# Patient Record
Sex: Male | Born: 1945 | Race: White | Hispanic: No | Smoking: Former smoker
Health system: Southern US, Community
[De-identification: ages and names within clinical notes are randomized; demographics above are authoritative.]

## PROBLEM LIST (undated history)

## (undated) DIAGNOSIS — N183 Chronic kidney disease, stage 3 unspecified: Secondary | ICD-10-CM

## (undated) DIAGNOSIS — Q632 Ectopic kidney: Secondary | ICD-10-CM

## (undated) DIAGNOSIS — T7840XA Allergy, unspecified, initial encounter: Secondary | ICD-10-CM

## (undated) DIAGNOSIS — E119 Type 2 diabetes mellitus without complications: Secondary | ICD-10-CM

## (undated) DIAGNOSIS — F32A Depression, unspecified: Secondary | ICD-10-CM

## (undated) DIAGNOSIS — M1990 Unspecified osteoarthritis, unspecified site: Secondary | ICD-10-CM

## (undated) DIAGNOSIS — N189 Chronic kidney disease, unspecified: Secondary | ICD-10-CM

## (undated) DIAGNOSIS — N529 Male erectile dysfunction, unspecified: Secondary | ICD-10-CM

## (undated) DIAGNOSIS — I10 Essential (primary) hypertension: Secondary | ICD-10-CM

## (undated) DIAGNOSIS — R569 Unspecified convulsions: Secondary | ICD-10-CM

## (undated) DIAGNOSIS — E785 Hyperlipidemia, unspecified: Secondary | ICD-10-CM

## (undated) DIAGNOSIS — F329 Major depressive disorder, single episode, unspecified: Secondary | ICD-10-CM

## (undated) HISTORY — DX: Male erectile dysfunction, unspecified: N52.9

## (undated) HISTORY — DX: Hyperlipidemia, unspecified: E78.5

## (undated) HISTORY — DX: Essential (primary) hypertension: I10

## (undated) HISTORY — DX: Depression, unspecified: F32.A

## (undated) HISTORY — DX: Allergy, unspecified, initial encounter: T78.40XA

## (undated) HISTORY — PX: HERNIA REPAIR: SHX51

## (undated) HISTORY — DX: Major depressive disorder, single episode, unspecified: F32.9

---

## 2006-06-29 ENCOUNTER — Ambulatory Visit: Payer: Self-pay | Admitting: Gastroenterology

## 2009-12-05 ENCOUNTER — Emergency Department: Payer: Self-pay | Admitting: Unknown Physician Specialty

## 2010-03-02 ENCOUNTER — Ambulatory Visit: Payer: Self-pay | Admitting: Gastroenterology

## 2012-01-24 ENCOUNTER — Ambulatory Visit: Payer: Self-pay | Admitting: Family Medicine

## 2012-02-16 ENCOUNTER — Ambulatory Visit: Payer: Self-pay | Admitting: Family Medicine

## 2013-12-02 ENCOUNTER — Ambulatory Visit: Payer: Self-pay | Admitting: Family Medicine

## 2014-11-27 HISTORY — PX: COLONOSCOPY: SHX174

## 2015-03-11 ENCOUNTER — Ambulatory Visit
Admit: 2015-03-11 | Disposition: A | Discharge status: Home / Self Care | Payer: Self-pay | Attending: Gastroenterology | Admitting: Gastroenterology

## 2015-03-22 LAB — SURGICAL PATHOLOGY

## 2015-07-20 ENCOUNTER — Encounter: Payer: Self-pay | Admitting: Family Medicine

## 2015-07-20 ENCOUNTER — Ambulatory Visit (INDEPENDENT_AMBULATORY_CARE_PROVIDER_SITE_OTHER): Payer: PPO | Admitting: Family Medicine

## 2015-07-20 VITALS — BP 130/80 | HR 70 | Ht 72.0 in | Wt 251.0 lb

## 2015-07-20 DIAGNOSIS — F32A Depression, unspecified: Secondary | ICD-10-CM | POA: Insufficient documentation

## 2015-07-20 DIAGNOSIS — E785 Hyperlipidemia, unspecified: Secondary | ICD-10-CM

## 2015-07-20 DIAGNOSIS — M19042 Primary osteoarthritis, left hand: Secondary | ICD-10-CM | POA: Diagnosis not present

## 2015-07-20 DIAGNOSIS — J301 Allergic rhinitis due to pollen: Secondary | ICD-10-CM

## 2015-07-20 DIAGNOSIS — I10 Essential (primary) hypertension: Secondary | ICD-10-CM | POA: Diagnosis not present

## 2015-07-20 DIAGNOSIS — M19041 Primary osteoarthritis, right hand: Secondary | ICD-10-CM

## 2015-07-20 DIAGNOSIS — F329 Major depressive disorder, single episode, unspecified: Secondary | ICD-10-CM | POA: Diagnosis not present

## 2015-07-20 DIAGNOSIS — N529 Male erectile dysfunction, unspecified: Secondary | ICD-10-CM

## 2015-07-20 DIAGNOSIS — M19049 Primary osteoarthritis, unspecified hand: Secondary | ICD-10-CM | POA: Insufficient documentation

## 2015-07-20 MED ORDER — SILDENAFIL CITRATE 100 MG PO TABS: 100.0000 mg | ORAL_TABLET | Freq: Every day | ORAL | Status: DC | PRN

## 2015-07-20 MED ORDER — FENOFIBRATE 145 MG PO TABS: 145.0000 mg | ORAL_TABLET | Freq: Every day | ORAL | Status: DC

## 2015-07-20 MED ORDER — SERTRALINE HCL 50 MG PO TABS: 50.0000 mg | ORAL_TABLET | Freq: Every day | ORAL | Status: DC

## 2015-07-20 MED ORDER — DICLOFENAC SODIUM 75 MG PO TBEC: 75.0000 mg | DELAYED_RELEASE_TABLET | Freq: Every day | ORAL | Status: DC

## 2015-07-20 MED ORDER — OMEGA-3-ACID ETHYL ESTERS 1 G PO CAPS: 1.0000 | ORAL_CAPSULE | Freq: Every day | ORAL | Status: DC

## 2015-07-20 MED ORDER — CARVEDILOL 6.25 MG PO TABS: 6.2500 mg | ORAL_TABLET | Freq: Two times a day (BID) | ORAL | Status: DC

## 2015-07-20 MED ORDER — FLUTICASONE PROPIONATE 50 MCG/ACT NA SUSP: 1.0000 | Freq: Every day | NASAL | Status: DC

## 2015-07-20 MED ORDER — HYDROCHLOROTHIAZIDE 12.5 MG PO TABS: 12.5000 mg | ORAL_TABLET | Freq: Every day | ORAL | Status: DC

## 2015-07-20 NOTE — Progress Notes (Signed)
Name: Gary Kramer   MRN: [id number]    DOB: 02-14-1946   Date:07/20/2015       Progress Note  Subjective  Chief Complaint  Chief Complaint  Patient presents with  . Allergic Rhinitis   . Hypertension  . Hyperlipidemia  . Depression    Hypertension This is a chronic problem. The current episode started more than 1 year ago. The problem has been gradually improving since onset. Pertinent negatives include no anxiety, blurred vision, chest pain, headaches, malaise/fatigue, neck pain, orthopnea, palpitations, peripheral edema, PND, shortness of breath or sweats. There are no associated agents to hypertension. Risk factors for coronary artery disease include dyslipidemia. Past treatments include beta blockers and diuretics. The current treatment provides moderate improvement. There are no compliance problems.  There is no history of angina, kidney disease, CAD/MI, CVA, heart failure, left ventricular hypertrophy, PVD, renovascular disease or retinopathy. There is no history of chronic renal disease.  Hyperlipidemia This is a chronic problem. The current episode started more than 1 year ago. The problem is controlled. Recent lipid tests were reviewed and are normal. He has no history of chronic renal disease, diabetes, hypothyroidism, liver disease, obesity or nephrotic syndrome. Factors aggravating his hyperlipidemia include beta blockers. Pertinent negatives include no chest pain, focal sensory loss, focal weakness, leg pain, myalgias or shortness of breath. Current antihyperlipidemic treatment includes nicotinic acid. There are no compliance problems.  There are no known risk factors for coronary artery disease.  Depression        The patient presents with no depression.  This is a recurrent problem.  The current episode started more than 1 year ago.   The onset quality is gradual.   The problem occurs intermittently.  The problem has been gradually improving since onset.  Associated symptoms  include does not have insomnia, no myalgias, no headaches and no suicidal ideas.     The symptoms are aggravated by nothing.  Past treatments include SSRIs - Selective serotonin reuptake inhibitors.  Compliance with treatment is good.   Pertinent negatives include no chronic fatigue syndrome, no chronic pain, no hypothyroidism, no chronic illness, no physical disability, no terminal illness, no anxiety, no eating disorder and no depression. Erectile Dysfunction This is a chronic problem. The current episode started more than 1 year ago. The problem has been waxing and waning since onset. He reports no anxiety or decreased libido. Irritative symptoms do not include frequency, nocturia or urgency. Pertinent negatives include no chills, dysuria or hematuria. Past treatments include sildenafil. The treatment provided mild relief.  Allergic Reaction This is a recurrent problem. The problem occurs intermittently. The problem has been waxing and waning since onset. Associated symptoms include eye itching and eye watering. Pertinent negatives include no abdominal pain, chest pain, coughing, diarrhea, rash or wheezing. There is no swelling present. Past treatments include one or more prescription drugs. The treatment provided moderate relief. His past medical history is significant for seasonal allergies.    No problem-specific assessment & plan notes found for this encounter.   Past Medical History  Diagnosis Date  . Hypertension   . Hyperlipidemia   . Depression   . Allergy   . Erectile dysfunction     Past Surgical History  Procedure Laterality Date  . Colonoscopy  2016    cleared for 5 yrs- Dr Candace Cruise    History reviewed. No pertinent family history.  Social History   Social History  . Marital Status: Married    Spouse Name: N/A  .  Number of Children: N/A  . Years of Education: N/A   Occupational History  . Not on file.   Social History Main Topics  . Smoking status: Former Research scientist (life sciences)  .  Smokeless tobacco: Not on file  . Alcohol Use: 0.0 oz/week    0 Standard drinks or equivalent per week  . Drug Use: No  . Sexual Activity: Yes   Other Topics Concern  . Not on file   Social History Narrative  . No narrative on file    Allergies  Allergen Reactions  . Cefaclor Itching  . Penicillin V Potassium Rash     Review of Systems  Constitutional: Negative for fever, chills, weight loss and malaise/fatigue.  HENT: Negative for ear discharge, ear pain and sore throat.   Eyes: Positive for itching. Negative for blurred vision.  Respiratory: Negative for cough, sputum production, shortness of breath and wheezing.   Cardiovascular: Negative for chest pain, palpitations, orthopnea, leg swelling and PND.  Gastrointestinal: Negative for heartburn, nausea, abdominal pain, diarrhea, constipation, blood in stool and melena.  Genitourinary: Negative for dysuria, urgency, frequency, hematuria, decreased libido and nocturia.  Musculoskeletal: Negative for myalgias, back pain, joint pain and neck pain.  Skin: Negative for rash.  Neurological: Negative for dizziness, tingling, sensory change, focal weakness and headaches.  Endo/Heme/Allergies: Negative for environmental allergies and polydipsia. Does not bruise/bleed easily.  Psychiatric/Behavioral: Positive for depression. Negative for suicidal ideas. The patient is not nervous/anxious and does not have insomnia.      Objective  Filed Vitals:   07/20/15 1419  BP: 130/80  Pulse: 70  Height: 6' (1.829 m)  Weight: 251 lb (113.853 kg)    Physical Exam  Constitutional: He is oriented to person, place, and time and well-developed, well-nourished, and in no distress.  HENT:  Head: Normocephalic.  Right Ear: External ear normal.  Left Ear: External ear normal.  Nose: Nose normal.  Mouth/Throat: Oropharynx is clear and moist.  Eyes: Conjunctivae and EOM are normal. Pupils are equal, round, and reactive to light. Right eye exhibits  no discharge. Left eye exhibits no discharge. No scleral icterus.  Neck: Normal range of motion. Neck supple. No JVD present. No tracheal deviation present. No thyromegaly present.  Cardiovascular: Normal rate, regular rhythm, normal heart sounds and intact distal pulses.  Exam reveals no gallop and no friction rub.   No murmur heard. Pulmonary/Chest: Breath sounds normal. No respiratory distress. He has no wheezes. He has no rales.  Abdominal: Soft. Bowel sounds are normal. He exhibits no mass. There is no hepatosplenomegaly. There is no tenderness. There is no rebound, no guarding and no CVA tenderness.  Musculoskeletal: Normal range of motion. He exhibits no edema or tenderness.  Lymphadenopathy:    He has no cervical adenopathy.  Neurological: He is alert and oriented to person, place, and time. He has normal sensation, normal strength, normal reflexes and intact cranial nerves. No cranial nerve deficit.  Skin: Skin is warm. No rash noted.  Psychiatric: Mood and affect normal.      Assessment & Plan  Problem List Items Addressed This Visit      Cardiovascular and Mediastinum   Essential hypertension - Primary   Relevant Medications   aspirin EC 81 MG tablet   carvedilol (COREG) 6.25 MG tablet   fenofibrate (TRICOR) 145 MG tablet   hydrochlorothiazide (HYDRODIURIL) 12.5 MG tablet   omega-3 acid ethyl esters (LOVAZA) 1 G capsule   sildenafil (VIAGRA) 100 MG tablet   Other Relevant Orders  Renal Function Panel     Respiratory   Allergic rhinitis due to pollen   Relevant Medications   fluticasone (FLONASE) 50 MCG/ACT nasal spray     Musculoskeletal and Integument   Degenerative joint disease of hand   Relevant Medications   aspirin EC 81 MG tablet   diclofenac (VOLTAREN) 75 MG EC tablet     Genitourinary   Erectile dysfunction   Relevant Medications   sildenafil (VIAGRA) 100 MG tablet     Other   Hyperlipidemia   Relevant Medications   aspirin EC 81 MG tablet    carvedilol (COREG) 6.25 MG tablet   fenofibrate (TRICOR) 145 MG tablet   hydrochlorothiazide (HYDRODIURIL) 12.5 MG tablet   omega-3 acid ethyl esters (LOVAZA) 1 G capsule   sildenafil (VIAGRA) 100 MG tablet   Other Relevant Orders   Lipid Profile   Depression   Relevant Medications   sertraline (ZOLOFT) 50 MG tablet        Dr. Rylei Codispoti [facility]  07/20/2015

## 2015-07-21 ENCOUNTER — Other Ambulatory Visit: Payer: Self-pay

## 2015-07-21 LAB — LIPID PANEL
CHOLESTEROL TOTAL: 161 mg/dL (ref 100–199)
Chol/HDL Ratio: 5.8 ratio units — ABNORMAL HIGH (ref 0.0–5.0)
HDL: 28 mg/dL — ABNORMAL LOW (ref >39)
LDL Calculated: 84 mg/dL (ref 0–99)
Triglycerides: 243 mg/dL — ABNORMAL HIGH (ref 0–149)
VLDL Cholesterol Cal: 49 mg/dL — ABNORMAL HIGH (ref 5–40)

## 2015-07-21 LAB — RENAL FUNCTION PANEL: PHOSPHORUS: 3.1 mg/dL (ref 2.5–4.5)

## 2015-08-30 ENCOUNTER — Other Ambulatory Visit: Payer: Self-pay | Admitting: Family Medicine

## 2015-08-30 DIAGNOSIS — E785 Hyperlipidemia, unspecified: Secondary | ICD-10-CM

## 2015-09-13 ENCOUNTER — Ambulatory Visit (INDEPENDENT_AMBULATORY_CARE_PROVIDER_SITE_OTHER): Payer: PPO

## 2015-09-13 DIAGNOSIS — Z23 Encounter for immunization: Secondary | ICD-10-CM

## 2016-01-14 ENCOUNTER — Ambulatory Visit (INDEPENDENT_AMBULATORY_CARE_PROVIDER_SITE_OTHER): Payer: PPO | Admitting: Family Medicine

## 2016-01-14 ENCOUNTER — Encounter: Payer: Self-pay | Admitting: Family Medicine

## 2016-01-14 VITALS — BP 102/62 | HR 78 | Ht 72.0 in | Wt 245.0 lb

## 2016-01-14 DIAGNOSIS — M19042 Primary osteoarthritis, left hand: Secondary | ICD-10-CM | POA: Diagnosis not present

## 2016-01-14 DIAGNOSIS — Z0000 Encounter for general adult medical examination without abnormal findings: Secondary | ICD-10-CM | POA: Diagnosis not present

## 2016-01-14 DIAGNOSIS — E785 Hyperlipidemia, unspecified: Secondary | ICD-10-CM | POA: Diagnosis not present

## 2016-01-14 DIAGNOSIS — I10 Essential (primary) hypertension: Secondary | ICD-10-CM | POA: Diagnosis not present

## 2016-01-14 DIAGNOSIS — F329 Major depressive disorder, single episode, unspecified: Secondary | ICD-10-CM | POA: Diagnosis not present

## 2016-01-14 DIAGNOSIS — M19041 Primary osteoarthritis, right hand: Secondary | ICD-10-CM

## 2016-01-14 DIAGNOSIS — F32A Depression, unspecified: Secondary | ICD-10-CM

## 2016-01-14 MED ORDER — OMEGA-3-ACID ETHYL ESTERS 1 G PO CAPS: 1.0000 | ORAL_CAPSULE | Freq: Every day | ORAL | Status: DC

## 2016-01-14 MED ORDER — HYDROCHLOROTHIAZIDE 12.5 MG PO TABS: 12.5000 mg | ORAL_TABLET | Freq: Every day | ORAL | Status: DC

## 2016-01-14 MED ORDER — ATORVASTATIN CALCIUM 20 MG PO TABS: ORAL_TABLET | ORAL | Status: DC

## 2016-01-14 MED ORDER — FENOFIBRATE 145 MG PO TABS: 145.0000 mg | ORAL_TABLET | Freq: Every day | ORAL | Status: DC

## 2016-01-14 MED ORDER — SERTRALINE HCL 50 MG PO TABS: 50.0000 mg | ORAL_TABLET | Freq: Every day | ORAL | Status: DC

## 2016-01-14 MED ORDER — CARVEDILOL 6.25 MG PO TABS: 6.2500 mg | ORAL_TABLET | Freq: Two times a day (BID) | ORAL | Status: DC

## 2016-01-14 MED ORDER — DICLOFENAC SODIUM 1 % TD GEL: 4.0000 g | Freq: Four times a day (QID) | TRANSDERMAL | Status: DC

## 2016-01-14 NOTE — Progress Notes (Signed)
Name: Gary Kramer   MRN: [id number]    DOB: 10/22/1946   Date:01/14/2016       Progress Note  Subjective  Chief Complaint  Chief Complaint  Patient presents with  . Hyperlipidemia  . Hypertension  . Depression  . Annual Exam    HPI Comments: Patient presents for annual physical exam.  Hyperlipidemia This is a chronic problem. The current episode started more than 1 year ago. The problem is controlled. Recent lipid tests were reviewed and are normal. He has no history of chronic renal disease, diabetes, hypothyroidism, liver disease, obesity or nephrotic syndrome. Pertinent negatives include no chest pain, focal sensory loss, focal weakness, leg pain, myalgias or shortness of breath. He is currently on no antihyperlipidemic treatment. The current treatment provides moderate improvement of lipids. There are no compliance problems.  Risk factors for coronary artery disease include dyslipidemia and hypertension.  Hypertension This is a chronic problem. The current episode started more than 1 year ago. The problem has been gradually improving since onset. The problem is controlled. Pertinent negatives include no anxiety, blurred vision, chest pain, headaches, malaise/fatigue, neck pain, orthopnea, palpitations, peripheral edema, PND, shortness of breath or sweats. Past treatments include beta blockers and diuretics. The current treatment provides no improvement. There are no compliance problems.  There is no history of angina, kidney disease, CAD/MI, CVA, heart failure, left ventricular hypertrophy, PVD, renovascular disease or retinopathy. There is no history of chronic renal disease or a hypertension causing med.  Depression        This is a chronic problem.  The current episode started more than 1 year ago.   The problem occurs daily.  The problem has been gradually improving since onset.  Associated symptoms include no decreased concentration, no fatigue, no helplessness, no hopelessness,  does not have insomnia, not irritable, no restlessness, no decreased interest, no appetite change, no body aches, no myalgias, no headaches, no indigestion, not sad and no suicidal ideas.  Past treatments include SSRIs - Selective serotonin reuptake inhibitors.   Pertinent negatives include no hypothyroidism and no anxiety.   No problem-specific assessment & plan notes found for this encounter.   Past Medical History  Diagnosis Date  . Hypertension   . Hyperlipidemia   . Depression   . Allergy   . Erectile dysfunction     Past Surgical History  Procedure Laterality Date  . Colonoscopy  2016    cleared for 5 yrs- Dr Candace Cruise    History reviewed. No pertinent family history.  Social History   Social History  . Marital Status: Married    Spouse Name: N/A  . Number of Children: N/A  . Years of Education: N/A   Occupational History  . Not on file.   Social History Main Topics  . Smoking status: Former Research scientist (life sciences)  . Smokeless tobacco: Not on file  . Alcohol Use: 0.0 oz/week    0 Standard drinks or equivalent per week  . Drug Use: No  . Sexual Activity: Yes   Other Topics Concern  . Not on file   Social History Narrative    Allergies  Allergen Reactions  . Cefaclor Itching  . Penicillin V Potassium Rash     Review of Systems  Constitutional: Negative for fever, chills, weight loss, malaise/fatigue, appetite change and fatigue.  HENT: Negative for ear discharge, ear pain and sore throat.   Eyes: Negative for blurred vision.  Respiratory: Negative for cough, sputum production, shortness of breath and wheezing.  Cardiovascular: Negative for chest pain, palpitations, orthopnea, leg swelling and PND.  Gastrointestinal: Negative for heartburn, nausea, abdominal pain, diarrhea, constipation, blood in stool and melena.  Genitourinary: Negative for dysuria, urgency, frequency and hematuria.  Musculoskeletal: Negative for myalgias, back pain, joint pain and neck pain.  Skin:  Negative for rash.  Neurological: Negative for dizziness, tingling, sensory change, focal weakness and headaches.  Endo/Heme/Allergies: Negative for environmental allergies and polydipsia. Does not bruise/bleed easily.  Psychiatric/Behavioral: Positive for depression. Negative for suicidal ideas and decreased concentration. The patient is not nervous/anxious and does not have insomnia.      Objective  Filed Vitals:   01/14/16 0921  BP: 102/62  Pulse: 78  Height: 6' (1.829 m)  Weight: 245 lb (111.131 kg)    Physical Exam  Constitutional: He is oriented to person, place, and time and well-developed, well-nourished, and in no distress. He is not irritable.  HENT:  Head: Normocephalic.  Right Ear: External ear normal.  Left Ear: External ear normal.  Nose: Nose normal.  Mouth/Throat: Oropharynx is clear and moist.  Eyes: Conjunctivae and EOM are normal. Pupils are equal, round, and reactive to light. Right eye exhibits no discharge. Left eye exhibits no discharge. No scleral icterus.  Neck: Normal range of motion. Neck supple. No JVD present. No tracheal deviation present. No thyromegaly present.  Cardiovascular: Normal rate, regular rhythm, normal heart sounds and intact distal pulses.  Exam reveals no gallop and no friction rub.   No murmur heard. Pulmonary/Chest: Breath sounds normal. No respiratory distress. He has no wheezes. He has no rales.  Abdominal: Soft. Bowel sounds are normal. He exhibits no mass. There is no hepatosplenomegaly. There is no tenderness. There is no rebound, no guarding and no CVA tenderness.  Genitourinary: Rectum normal and prostate normal.  Musculoskeletal: Normal range of motion. He exhibits no edema or tenderness.  Lymphadenopathy:    He has no cervical adenopathy.  Neurological: He is alert and oriented to person, place, and time. He has normal sensation, normal strength, normal reflexes and intact cranial nerves. No cranial nerve deficit.  Skin: Skin  is warm. No rash noted.  Psychiatric: Mood and affect normal.  Nursing note and vitals reviewed.     Assessment & Plan  Problem List Items Addressed This Visit      Cardiovascular and Mediastinum   Essential hypertension   Relevant Medications   atorvastatin (LIPITOR) 20 MG tablet   carvedilol (COREG) 6.25 MG tablet   fenofibrate (TRICOR) 145 MG tablet   hydrochlorothiazide (HYDRODIURIL) 12.5 MG tablet   omega-3 acid ethyl esters (LOVAZA) 1 g capsule   Other Relevant Orders   Renal Function Panel     Musculoskeletal and Integument   Degenerative joint disease of hand     Other   Hyperlipidemia   Relevant Medications   atorvastatin (LIPITOR) 20 MG tablet   carvedilol (COREG) 6.25 MG tablet   fenofibrate (TRICOR) 145 MG tablet   hydrochlorothiazide (HYDRODIURIL) 12.5 MG tablet   omega-3 acid ethyl esters (LOVAZA) 1 g capsule   Other Relevant Orders   Lipid Profile   Depression   Relevant Medications   sertraline (ZOLOFT) 50 MG tablet    Other Visit Diagnoses    Annual physical exam    -  Primary         Dr. Deanna Jones [facility]  01/14/2016

## 2016-01-15 LAB — RENAL FUNCTION PANEL
Albumin: 4.4 g/dL (ref 3.6–4.8)
BUN / CREAT RATIO: 11 (ref 10–22)
BUN: 14 mg/dL (ref 8–27)
CALCIUM: 9.7 mg/dL (ref 8.6–10.2)
CHLORIDE: 103 mmol/L (ref 96–106)
CO2: 24 mmol/L (ref 18–29)
Creatinine, Ser: 1.26 mg/dL (ref 0.76–1.27)
GFR calc Af Amer: 67 mL/min/{1.73_m2} (ref >59)
GFR calc non Af Amer: 58 mL/min/{1.73_m2} — ABNORMAL LOW (ref >59)
Glucose: 112 mg/dL — ABNORMAL HIGH (ref 65–99)
PHOSPHORUS: 3.3 mg/dL (ref 2.5–4.5)
Potassium: 4.4 mmol/L (ref 3.5–5.2)
SODIUM: 143 mmol/L (ref 134–144)

## 2016-01-15 LAB — LIPID PANEL
CHOLESTEROL TOTAL: 145 mg/dL (ref 100–199)
Chol/HDL Ratio: 4.5 ratio units (ref 0.0–5.0)
HDL: 32 mg/dL — ABNORMAL LOW (ref >39)
LDL Calculated: 78 mg/dL (ref 0–99)
Triglycerides: 173 mg/dL — ABNORMAL HIGH (ref 0–149)
VLDL CHOLESTEROL CAL: 35 mg/dL (ref 5–40)

## 2016-06-20 ENCOUNTER — Other Ambulatory Visit: Payer: Self-pay

## 2016-06-28 DIAGNOSIS — Z1283 Encounter for screening for malignant neoplasm of skin: Secondary | ICD-10-CM | POA: Diagnosis not present

## 2016-06-28 DIAGNOSIS — L570 Actinic keratosis: Secondary | ICD-10-CM | POA: Diagnosis not present

## 2016-06-28 DIAGNOSIS — D1800 Hemangioma unspecified site: Secondary | ICD-10-CM | POA: Diagnosis not present

## 2016-06-28 DIAGNOSIS — L578 Other skin changes due to chronic exposure to nonionizing radiation: Secondary | ICD-10-CM | POA: Diagnosis not present

## 2016-06-28 DIAGNOSIS — L820 Inflamed seborrheic keratosis: Secondary | ICD-10-CM | POA: Diagnosis not present

## 2016-06-28 DIAGNOSIS — I788 Other diseases of capillaries: Secondary | ICD-10-CM | POA: Diagnosis not present

## 2016-06-28 DIAGNOSIS — L918 Other hypertrophic disorders of the skin: Secondary | ICD-10-CM | POA: Diagnosis not present

## 2016-07-21 ENCOUNTER — Ambulatory Visit (INDEPENDENT_AMBULATORY_CARE_PROVIDER_SITE_OTHER): Payer: PPO | Admitting: Family Medicine

## 2016-07-21 ENCOUNTER — Encounter: Payer: Self-pay | Admitting: Family Medicine

## 2016-07-21 VITALS — BP 130/70 | HR 78 | Ht 72.0 in | Wt 245.0 lb

## 2016-07-21 DIAGNOSIS — I10 Essential (primary) hypertension: Secondary | ICD-10-CM | POA: Diagnosis not present

## 2016-07-21 DIAGNOSIS — M19041 Primary osteoarthritis, right hand: Secondary | ICD-10-CM | POA: Diagnosis not present

## 2016-07-21 DIAGNOSIS — N529 Male erectile dysfunction, unspecified: Secondary | ICD-10-CM

## 2016-07-21 DIAGNOSIS — F329 Major depressive disorder, single episode, unspecified: Secondary | ICD-10-CM | POA: Diagnosis not present

## 2016-07-21 DIAGNOSIS — M19042 Primary osteoarthritis, left hand: Secondary | ICD-10-CM | POA: Diagnosis not present

## 2016-07-21 DIAGNOSIS — E785 Hyperlipidemia, unspecified: Secondary | ICD-10-CM | POA: Diagnosis not present

## 2016-07-21 DIAGNOSIS — J301 Allergic rhinitis due to pollen: Secondary | ICD-10-CM

## 2016-07-21 DIAGNOSIS — F32A Depression, unspecified: Secondary | ICD-10-CM

## 2016-07-21 MED ORDER — AMLODIPINE BESY-BENAZEPRIL HCL 10-20 MG PO CAPS: 1.0000 | ORAL_CAPSULE | Freq: Every day | ORAL | 6 refills | Status: DC

## 2016-07-21 MED ORDER — FLUTICASONE PROPIONATE 50 MCG/ACT NA SUSP: 1.0000 | Freq: Every day | NASAL | 11 refills | Status: DC

## 2016-07-21 MED ORDER — CARVEDILOL 6.25 MG PO TABS: 6.2500 mg | ORAL_TABLET | Freq: Two times a day (BID) | ORAL | 6 refills | Status: DC

## 2016-07-21 MED ORDER — SILDENAFIL CITRATE 100 MG PO TABS: 100.0000 mg | ORAL_TABLET | Freq: Every day | ORAL | 5 refills | Status: DC | PRN

## 2016-07-21 MED ORDER — FENOFIBRATE 145 MG PO TABS: 145.0000 mg | ORAL_TABLET | Freq: Every day | ORAL | 6 refills | Status: DC

## 2016-07-21 MED ORDER — SERTRALINE HCL 50 MG PO TABS: 50.0000 mg | ORAL_TABLET | Freq: Every day | ORAL | 6 refills | Status: DC

## 2016-07-21 MED ORDER — CETIRIZINE HCL 10 MG PO TABS: 10.0000 mg | ORAL_TABLET | Freq: Every day | ORAL | 6 refills | Status: DC

## 2016-07-21 MED ORDER — DICLOFENAC SODIUM 75 MG PO TBEC: 75.0000 mg | DELAYED_RELEASE_TABLET | Freq: Every day | ORAL | 6 refills | Status: DC

## 2016-07-21 MED ORDER — HYDROCHLOROTHIAZIDE 12.5 MG PO TABS: 12.5000 mg | ORAL_TABLET | Freq: Every day | ORAL | 6 refills | Status: DC

## 2016-07-21 MED ORDER — ATORVASTATIN CALCIUM 20 MG PO TABS
ORAL_TABLET | ORAL | 6 refills | Status: DC
Start: 2016-07-21 — End: 2017-03-22

## 2016-07-21 NOTE — Progress Notes (Signed)
Name: Gary Kramer   MRN: [id number]    DOB: 14-Apr-1946   Date:07/21/2016       Progress Note  Subjective  Chief Complaint  Chief Complaint  Patient presents with  . Hypertension  . Depression  . Hyperlipidemia  . Allergic Rhinitis   . joint pains    takes Voltaren    Hypertension  This is a chronic problem. The current episode started more than 1 year ago. The problem has been gradually improving since onset. The problem is controlled. Pertinent negatives include no anxiety, blurred vision, chest pain, headaches, malaise/fatigue, neck pain, orthopnea, palpitations, peripheral edema, PND or shortness of breath. There are no associated agents to hypertension. There are no known risk factors for coronary artery disease. Past treatments include ACE inhibitors, calcium channel blockers, diuretics and beta blockers. The current treatment provides mild improvement. There are no compliance problems.  There is no history of angina, kidney disease, CAD/MI, CVA, heart failure, left ventricular hypertrophy, PVD, renovascular disease or retinopathy. There is no history of chronic renal disease or a hypertension causing med.  Depression         This is a chronic problem.  The current episode started more than 1 year ago.   The onset quality is sudden.   The problem has been gradually improving since onset.  Associated symptoms include no decreased concentration, no fatigue, no helplessness, no hopelessness, does not have insomnia, not irritable, no restlessness, no myalgias, no headaches, not sad and no suicidal ideas.     The symptoms are aggravated by nothing.  Past treatments include SSRIs - Selective serotonin reuptake inhibitors.  Compliance with treatment is good.  Previous treatment provided moderate relief.   Pertinent negatives include no anxiety. Hyperlipidemia  This is a chronic problem. The current episode started more than 1 year ago. The problem is controlled. Recent lipid tests were  reviewed and are normal. He has no history of chronic renal disease. Pertinent negatives include no chest pain, focal weakness, myalgias or shortness of breath. He is currently on no antihyperlipidemic treatment. The current treatment provides mild improvement of lipids. There are no compliance problems.  There are no known risk factors for coronary artery disease.    No problem-specific Assessment & Plan notes found for this encounter.   Past Medical History:  Diagnosis Date  . Allergy   . Depression   . Erectile dysfunction   . Hyperlipidemia   . Hypertension     Past Surgical History:  Procedure Laterality Date  . COLONOSCOPY  2016   cleared for 5 yrs- Dr Candace Cruise    History reviewed. No pertinent family history.  Social History   Social History  . Marital status: Married    Spouse name: N/A  . Number of children: N/A  . Years of education: N/A   Occupational History  . Not on file.   Social History Main Topics  . Smoking status: Former Research scientist (life sciences)  . Smokeless tobacco: Not on file  . Alcohol use 0.0 oz/week  . Drug use: No  . Sexual activity: Yes   Other Topics Concern  . Not on file   Social History Narrative  . No narrative on file    Allergies  Allergen Reactions  . Cefaclor Itching  . Penicillin V Potassium Rash     Review of Systems  Constitutional: Negative for chills, fatigue, fever, malaise/fatigue and weight loss.  HENT: Negative for ear discharge, ear pain and sore throat.   Eyes: Negative for blurred  vision.  Respiratory: Negative for cough, sputum production, shortness of breath and wheezing.   Cardiovascular: Negative for chest pain, palpitations, orthopnea, leg swelling and PND.  Gastrointestinal: Negative for abdominal pain, blood in stool, constipation, diarrhea, heartburn, melena and nausea.  Genitourinary: Negative for dysuria, frequency, hematuria and urgency.  Musculoskeletal: Negative for back pain, joint pain, myalgias and neck pain.   Skin: Negative for rash.  Neurological: Negative for dizziness, tingling, sensory change, focal weakness and headaches.  Endo/Heme/Allergies: Negative for environmental allergies and polydipsia. Does not bruise/bleed easily.  Psychiatric/Behavioral: Positive for depression. Negative for decreased concentration and suicidal ideas. The patient is not nervous/anxious and does not have insomnia.      Objective  Vitals:   07/21/16 1346  BP: 130/70  Pulse: 78  Weight: 245 lb (111.1 kg)  Height: 6' (1.829 m)    Physical Exam  Constitutional: He is oriented to person, place, and time and well-developed, well-nourished, and in no distress. He is not irritable.  HENT:  Head: Normocephalic.  Right Ear: External ear normal.  Left Ear: External ear normal.  Nose: Nose normal.  Mouth/Throat: Oropharynx is clear and moist.  Eyes: Conjunctivae and EOM are normal. Pupils are equal, round, and reactive to light. Right eye exhibits no discharge. Left eye exhibits no discharge. No scleral icterus.  Neck: Normal range of motion. Neck supple. No JVD present. No tracheal deviation present. No thyromegaly present.  Cardiovascular: Normal rate, regular rhythm, normal heart sounds and intact distal pulses.  Exam reveals no gallop and no friction rub.   No murmur heard. Pulmonary/Chest: Breath sounds normal. No respiratory distress. He has no wheezes. He has no rales.  Abdominal: Soft. Bowel sounds are normal. He exhibits no mass. There is no hepatosplenomegaly. There is no tenderness. There is no rebound, no guarding and no CVA tenderness.  Musculoskeletal: Normal range of motion. He exhibits no edema or tenderness.  Lymphadenopathy:    He has no cervical adenopathy.  Neurological: He is alert and oriented to person, place, and time. He has normal sensation, normal strength, normal reflexes and intact cranial nerves. No cranial nerve deficit.  Skin: Skin is warm. No rash noted.  Psychiatric: Mood and  affect normal.  Nursing note and vitals reviewed.     Assessment & Plan  Problem List Items Addressed This Visit      Cardiovascular and Mediastinum   Essential hypertension - Primary   Relevant Medications   sildenafil (VIAGRA) 100 MG tablet   carvedilol (COREG) 6.25 MG tablet   hydrochlorothiazide (HYDRODIURIL) 12.5 MG tablet   atorvastatin (LIPITOR) 20 MG tablet   fenofibrate (TRICOR) 145 MG tablet   amLODipine-benazepril (LOTREL) 10-20 MG capsule   Other Relevant Orders   Renal Function Panel     Respiratory   Allergic rhinitis due to pollen   Relevant Medications   fluticasone (FLONASE) 50 MCG/ACT nasal spray   cetirizine (ZYRTEC) 10 MG tablet     Musculoskeletal and Integument   Degenerative joint disease of hand   Relevant Medications   diclofenac (VOLTAREN) 75 MG EC tablet     Genitourinary   Erectile dysfunction   Relevant Medications   sildenafil (VIAGRA) 100 MG tablet     Other   Hyperlipidemia   Relevant Medications   sildenafil (VIAGRA) 100 MG tablet   carvedilol (COREG) 6.25 MG tablet   hydrochlorothiazide (HYDRODIURIL) 12.5 MG tablet   atorvastatin (LIPITOR) 20 MG tablet   fenofibrate (TRICOR) 145 MG tablet   amLODipine-benazepril (LOTREL) 10-20 MG capsule  Other Relevant Orders   Lipid Profile   Depression   Relevant Medications   sertraline (ZOLOFT) 50 MG tablet    Other Visit Diagnoses   None.       Dr. Macon [facility]  07/21/16

## 2016-07-22 LAB — RENAL FUNCTION PANEL
Albumin: 4.7 g/dL (ref 3.6–4.8)
BUN / CREAT RATIO: 13 (ref 10–24)
BUN: 22 mg/dL (ref 8–27)
CO2: 22 mmol/L (ref 18–29)
CREATININE: 1.75 mg/dL — AB (ref 0.76–1.27)
Calcium: 10 mg/dL (ref 8.6–10.2)
Chloride: 103 mmol/L (ref 96–106)
GFR calc Af Amer: 45 mL/min/{1.73_m2} — ABNORMAL LOW (ref >59)
GFR, EST NON AFRICAN AMERICAN: 39 mL/min/{1.73_m2} — AB (ref >59)
GLUCOSE: 99 mg/dL (ref 65–99)
PHOSPHORUS: 3.1 mg/dL (ref 2.5–4.5)
POTASSIUM: 4.7 mmol/L (ref 3.5–5.2)
SODIUM: 142 mmol/L (ref 134–144)

## 2016-07-22 LAB — LIPID PANEL
CHOL/HDL RATIO: 4.6 ratio (ref 0.0–5.0)
Cholesterol, Total: 151 mg/dL (ref 100–199)
HDL: 33 mg/dL — AB (ref >39)
LDL CALC: 77 mg/dL (ref 0–99)
TRIGLYCERIDES: 204 mg/dL — AB (ref 0–149)
VLDL Cholesterol Cal: 41 mg/dL — ABNORMAL HIGH (ref 5–40)

## 2016-08-28 ENCOUNTER — Ambulatory Visit (INDEPENDENT_AMBULATORY_CARE_PROVIDER_SITE_OTHER): Payer: PPO

## 2016-08-28 DIAGNOSIS — Z23 Encounter for immunization: Secondary | ICD-10-CM | POA: Diagnosis not present

## 2016-08-30 ENCOUNTER — Other Ambulatory Visit: Payer: Self-pay

## 2016-12-08 ENCOUNTER — Ambulatory Visit (INDEPENDENT_AMBULATORY_CARE_PROVIDER_SITE_OTHER): Payer: PPO | Admitting: Family Medicine

## 2016-12-08 ENCOUNTER — Encounter: Payer: Self-pay | Admitting: Family Medicine

## 2016-12-08 VITALS — BP 120/64 | HR 72 | Ht 72.0 in | Wt 245.0 lb

## 2016-12-08 DIAGNOSIS — J40 Bronchitis, not specified as acute or chronic: Secondary | ICD-10-CM

## 2016-12-08 MED ORDER — LEVOFLOXACIN 500 MG PO TABS: 500.0000 mg | ORAL_TABLET | Freq: Every day | ORAL | 0 refills | Status: DC

## 2016-12-08 MED ORDER — GUAIFENESIN-CODEINE 100-10 MG/5ML PO SYRP: 5.0000 mL | ORAL_SOLUTION | Freq: Three times a day (TID) | ORAL | 0 refills | Status: DC | PRN

## 2016-12-08 NOTE — Progress Notes (Signed)
Name: Gary Kramer   MRN: [id number]    DOB: Nov 05, 1946   Date:12/08/2016       Progress Note  Subjective  Chief Complaint  Chief Complaint  Patient presents with  . Bronchitis    sore throat, cough, getting hot if walks around alot, feels like something "is stuck in throat"    Cough  This is a new problem. The current episode started more than 1 year ago. The problem has been waxing and waning. The cough is non-productive. Pertinent negatives include no chest pain, chills, ear congestion, ear pain, fever, headaches, heartburn, myalgias, nasal congestion, postnasal drip, rash, rhinorrhea, sore throat, shortness of breath, sweats, weight loss or wheezing. He has tried nothing for the symptoms. There is no history of environmental allergies.    No problem-specific Assessment & Plan notes found for this encounter.   Past Medical History:  Diagnosis Date  . Allergy   . Depression   . Erectile dysfunction   . Hyperlipidemia   . Hypertension     Past Surgical History:  Procedure Laterality Date  . COLONOSCOPY  2016   cleared for 5 yrs- Dr Candace Cruise    History reviewed. No pertinent family history.  Social History   Social History  . Marital status: Married    Spouse name: N/A  . Number of children: N/A  . Years of education: N/A   Occupational History  . Not on file.   Social History Main Topics  . Smoking status: Former Research scientist (life sciences)  . Smokeless tobacco: Not on file  . Alcohol use 0.0 oz/week  . Drug use: No  . Sexual activity: Yes   Other Topics Concern  . Not on file   Social History Narrative  . No narrative on file    Allergies  Allergen Reactions  . Cefaclor Itching  . Penicillin V Potassium Rash     Review of Systems  Constitutional: Negative for chills, fever, malaise/fatigue and weight loss.  HENT: Negative for ear discharge, ear pain, postnasal drip, rhinorrhea and sore throat.   Eyes: Negative for blurred vision.  Respiratory: Positive for cough.  Negative for sputum production, shortness of breath and wheezing.   Cardiovascular: Negative for chest pain, palpitations and leg swelling.  Gastrointestinal: Negative for abdominal pain, blood in stool, constipation, diarrhea, heartburn, melena and nausea.  Genitourinary: Negative for dysuria, frequency, hematuria and urgency.  Musculoskeletal: Negative for back pain, joint pain, myalgias and neck pain.  Skin: Negative for rash.  Neurological: Negative for dizziness, tingling, sensory change, focal weakness and headaches.  Endo/Heme/Allergies: Negative for environmental allergies and polydipsia. Does not bruise/bleed easily.  Psychiatric/Behavioral: Negative for depression and suicidal ideas. The patient is not nervous/anxious and does not have insomnia.      Objective  Vitals:   12/08/16 1056  BP: 120/64  Pulse: 72  SpO2: 95%  Weight: 245 lb (111.1 kg)  Height: 6' (1.829 m)    Physical Exam  Constitutional: He is oriented to person, place, and time and well-developed, well-nourished, and in no distress.  HENT:  Head: Normocephalic.  Right Ear: External ear normal.  Left Ear: External ear normal.  Nose: Nose normal.  Mouth/Throat: Oropharynx is clear and moist.  Eyes: Conjunctivae and EOM are normal. Pupils are equal, round, and reactive to light. Right eye exhibits no discharge. Left eye exhibits no discharge. No scleral icterus.  Neck: Normal range of motion. Neck supple. No JVD present. No tracheal deviation present. No thyromegaly present.  Cardiovascular: Normal rate, regular rhythm,  normal heart sounds and intact distal pulses.  Exam reveals no gallop and no friction rub.   No murmur heard. Pulmonary/Chest: Breath sounds normal. No respiratory distress. He has no wheezes. He has no rales.  Abdominal: Soft. Bowel sounds are normal. He exhibits no mass. There is no hepatosplenomegaly. There is no tenderness. There is no rebound, no guarding and no CVA tenderness.   Musculoskeletal: Normal range of motion. He exhibits no edema or tenderness.  Lymphadenopathy:    He has no cervical adenopathy.  Neurological: He is alert and oriented to person, place, and time. He has normal sensation, normal strength and intact cranial nerves. No cranial nerve deficit.  Skin: Skin is warm. No rash noted.  Psychiatric: Mood and affect normal.  Nursing note and vitals reviewed.     Assessment & Plan  Problem List Items Addressed This Visit    None    Visit Diagnoses    Bronchitis    -  Primary   Relevant Medications   levofloxacin (LEVAQUIN) 500 MG tablet   guaiFENesin-codeine (ROBITUSSIN AC) 100-10 MG/5ML syrup        Dr. [facility]  12/08/16

## 2017-01-01 ENCOUNTER — Ambulatory Visit (INDEPENDENT_AMBULATORY_CARE_PROVIDER_SITE_OTHER): Payer: PPO | Admitting: Family Medicine

## 2017-01-01 ENCOUNTER — Encounter: Payer: Self-pay | Admitting: Family Medicine

## 2017-01-01 VITALS — BP 144/80 | HR 78 | Ht 72.0 in | Wt 241.0 lb

## 2017-01-01 DIAGNOSIS — J0100 Acute maxillary sinusitis, unspecified: Secondary | ICD-10-CM

## 2017-01-01 DIAGNOSIS — J40 Bronchitis, not specified as acute or chronic: Secondary | ICD-10-CM

## 2017-01-01 MED ORDER — DOXYCYCLINE HYCLATE 100 MG PO TABS: 100.0000 mg | ORAL_TABLET | Freq: Two times a day (BID) | ORAL | 0 refills | Status: DC

## 2017-01-01 NOTE — Progress Notes (Signed)
Name: Gary Kramer   MRN: [id number]    DOB: September 06, 1946   Date:01/01/2017       Progress Note  Subjective  Chief Complaint  Chief Complaint  Patient presents with  . Sinusitis    cough and cong- had a round of Levaquin 500mg  x 7 days    Sinusitis  This is a new problem. The current episode started in the past 7 days. The problem has been waxing and waning since onset. There has been no fever. The pain is moderate. Pertinent negatives include no chills, congestion, coughing, diaphoresis, ear pain, headaches, hoarse voice, neck pain, shortness of breath, sinus pressure, sneezing, sore throat or swollen glands. Past treatments include antibiotics. The treatment provided moderate relief.    No problem-specific Assessment & Plan notes found for this encounter.   Past Medical History:  Diagnosis Date  . Allergy   . Depression   . Erectile dysfunction   . Hyperlipidemia   . Hypertension     Past Surgical History:  Procedure Laterality Date  . COLONOSCOPY  2016   cleared for 5 yrs- Dr Candace Cruise    History reviewed. No pertinent family history.  Social History   Social History  . Marital status: Married    Spouse name: N/A  . Number of children: N/A  . Years of education: N/A   Occupational History  . Not on file.   Social History Main Topics  . Smoking status: Former Research scientist (life sciences)  . Smokeless tobacco: Not on file  . Alcohol use 0.0 oz/week  . Drug use: No  . Sexual activity: Yes   Other Topics Concern  . Not on file   Social History Narrative  . No narrative on file    Allergies  Allergen Reactions  . Cefaclor Itching  . Penicillin V Potassium Rash     Review of Systems  Constitutional: Negative for chills, diaphoresis, fever, malaise/fatigue and weight loss.  HENT: Negative for congestion, ear discharge, ear pain, hoarse voice, sinus pressure, sneezing and sore throat.   Eyes: Negative for blurred vision.  Respiratory: Negative for cough, sputum production,  shortness of breath and wheezing.   Cardiovascular: Negative for chest pain, palpitations and leg swelling.  Gastrointestinal: Negative for abdominal pain, blood in stool, constipation, diarrhea, heartburn, melena and nausea.  Genitourinary: Negative for dysuria, frequency, hematuria and urgency.  Musculoskeletal: Negative for back pain, joint pain, myalgias and neck pain.  Skin: Negative for rash.  Neurological: Negative for dizziness, tingling, sensory change, focal weakness and headaches.  Endo/Heme/Allergies: Negative for environmental allergies and polydipsia. Does not bruise/bleed easily.  Psychiatric/Behavioral: Negative for depression and suicidal ideas. The patient is not nervous/anxious and does not have insomnia.      Objective  Vitals:   01/01/17 1613  BP: (!) 144/80  Pulse: 78  Weight: 241 lb (109.3 kg)  Height: 6' (1.829 m)    Physical Exam  Constitutional: He is oriented to person, place, and time and well-developed, well-nourished, and in no distress.  HENT:  Head: Normocephalic.  Right Ear: External ear normal.  Left Ear: External ear normal.  Nose: Nose normal.  Mouth/Throat: Oropharynx is clear and moist.  Eyes: Conjunctivae and EOM are normal. Pupils are equal, round, and reactive to light. Right eye exhibits no discharge. Left eye exhibits no discharge. No scleral icterus.  Neck: Normal range of motion. Neck supple. No JVD present. No tracheal deviation present. No thyromegaly present.  Cardiovascular: Normal rate, regular rhythm, normal heart sounds and intact distal pulses.  Exam reveals no gallop and no friction rub.   No murmur heard. Pulmonary/Chest: Breath sounds normal. No respiratory distress. He has no wheezes. He has no rales.  Abdominal: Soft. Bowel sounds are normal. He exhibits no mass. There is no hepatosplenomegaly. There is no tenderness. There is no rebound, no guarding and no CVA tenderness.  Musculoskeletal: Normal range of motion. He  exhibits no edema or tenderness.  Lymphadenopathy:    He has no cervical adenopathy.  Neurological: He is alert and oriented to person, place, and time. He has normal sensation, normal strength, normal reflexes and intact cranial nerves. No cranial nerve deficit.  Skin: Skin is warm. No rash noted.  Psychiatric: Mood and affect normal.  Nursing note and vitals reviewed.     Assessment & Plan  Problem List Items Addressed This Visit    None    Visit Diagnoses    Acute maxillary sinusitis, recurrence not specified    -  Primary   Relevant Medications   doxycycline (VIBRA-TABS) 100 MG tablet   Bronchitis       Relevant Medications   doxycycline (VIBRA-TABS) 100 MG tablet        Dr. [facility]  01/01/17

## 2017-01-05 ENCOUNTER — Ambulatory Visit (INDEPENDENT_AMBULATORY_CARE_PROVIDER_SITE_OTHER): Payer: PPO | Admitting: Family Medicine

## 2017-01-05 VITALS — BP 120/70 | HR 72 | Resp 12 | Ht 72.0 in | Wt 243.0 lb

## 2017-01-05 DIAGNOSIS — Z23 Encounter for immunization: Secondary | ICD-10-CM | POA: Diagnosis not present

## 2017-01-05 DIAGNOSIS — J309 Allergic rhinitis, unspecified: Secondary | ICD-10-CM | POA: Diagnosis not present

## 2017-01-05 DIAGNOSIS — I10 Essential (primary) hypertension: Secondary | ICD-10-CM

## 2017-01-05 DIAGNOSIS — Z0000 Encounter for general adult medical examination without abnormal findings: Secondary | ICD-10-CM

## 2017-01-05 DIAGNOSIS — E785 Hyperlipidemia, unspecified: Secondary | ICD-10-CM | POA: Diagnosis not present

## 2017-01-05 MED ORDER — LORATADINE 10 MG PO TABS: 10.0000 mg | ORAL_TABLET | Freq: Every day | ORAL | 3 refills | Status: DC

## 2017-01-05 NOTE — Progress Notes (Signed)
Patient: Gary Kramer, Male    DOB: [birth year], 71 y.o.   MRN: [id number] Visit Date: 01/05/2017  Today's Provider: Otilio Miu, MD   Chief Complaint  Patient presents with  . Medicare Wellness    wants loratadine prescribed   Subjective:   Initial preventative physical exam Gary Kramer is a 71 y.o. male who presents today for his Initial Preventative Physical Exam. He feels well. He reports exercising . He reports he is sleeping well.  Patient presents for medicare annual wellness.   URI   This is a recurrent problem. The current episode started more than 1 year ago. The problem has been waxing and waning. There has been no fever. Associated symptoms include rhinorrhea and sneezing. Pertinent negatives include no abdominal pain, chest pain, congestion, coughing, diarrhea, dysuria, ear pain, headaches, joint pain, joint swelling, nausea, neck pain, plugged ear sensation, rash, sinus pain, sore throat, swollen glands, vomiting or wheezing. He has tried nothing for the symptoms. The treatment provided mild relief.    Review of Systems  Constitutional: Negative for chills and fever.  HENT: Positive for rhinorrhea and sneezing. Negative for congestion, drooling, ear discharge, ear pain, sinus pain and sore throat.   Eyes: Negative for pain and redness.  Respiratory: Negative for cough, chest tightness, shortness of breath and wheezing.   Cardiovascular: Negative for chest pain, palpitations and leg swelling.  Gastrointestinal: Negative for abdominal pain, blood in stool, constipation, diarrhea, nausea and vomiting.  Endocrine: Negative for polydipsia.  Genitourinary: Negative for dysuria, frequency, hematuria and urgency.  Musculoskeletal: Negative for back pain, joint pain, myalgias and neck pain.  Skin: Negative for rash.  Allergic/Immunologic: Negative for environmental allergies.  Neurological: Negative for dizziness and headaches.  Hematological: Does not bruise/bleed  easily.  Psychiatric/Behavioral: Negative for suicidal ideas. The patient is not nervous/anxious.     Social History   Social History  . Marital status: Married    Spouse name: N/A  . Number of children: N/A  . Years of education: N/A   Occupational History  . Not on file.   Social History Main Topics  . Smoking status: Former Research scientist (life sciences)  . Smokeless tobacco: Not on file  . Alcohol use 0.0 oz/week  . Drug use: No  . Sexual activity: Yes   Other Topics Concern  . Not on file   Social History Narrative  . No narrative on file    Patient Active Problem List   Diagnosis Date Noted  . Chronic allergic rhinitis 01/05/2017  . Essential hypertension 07/20/2015  . Hyperlipidemia 07/20/2015  . Erectile dysfunction 07/20/2015  . Depression 07/20/2015  . Allergic rhinitis due to pollen 07/20/2015  . Degenerative joint disease of hand 07/20/2015    Past Surgical History:  Procedure Laterality Date  . COLONOSCOPY  2016   cleared for 5 yrs- Dr Candace Cruise    His family history is not on file.     Previous Medications   AMLODIPINE-BENAZEPRIL (LOTREL) 10-20 MG CAPSULE    Take 1 capsule by mouth daily.   ASPIRIN EC 81 MG TABLET    Take 1 tablet by mouth daily at 6 (six) AM.   ATORVASTATIN (LIPITOR) 20 MG TABLET    TAKE ONE TABLET AT BEDTIME.   CARVEDILOL (COREG) 6.25 MG TABLET    Take 1 tablet (6.25 mg total) by mouth 2 (two) times daily.   DICLOFENAC (VOLTAREN) 75 MG EC TABLET    Take 1 tablet (75 mg total) by mouth daily.   DICLOFENAC SODIUM (VOLTAREN)  1 % GEL    Apply 4 g topically 4 (four) times daily.   DOXYCYCLINE (VIBRA-TABS) 100 MG TABLET    Take 1 tablet (100 mg total) by mouth 2 (two) times daily.   FENOFIBRATE (TRICOR) 145 MG TABLET    Take 1 tablet (145 mg total) by mouth daily at 6 (six) AM.   FLUTICASONE (FLONASE) 50 MCG/ACT NASAL SPRAY    Place 1 spray into both nostrils daily.   GARLIC [id number] MG CAPS    Take 1 capsule by mouth daily at 6 (six) AM.   HYDROCHLOROTHIAZIDE  (HYDRODIURIL) 12.5 MG TABLET    Take 1 tablet (12.5 mg total) by mouth daily at 6 (six) AM.   MULTIPLE VITAMIN (MULTI-VITAMINS) TABS    Take 1 tablet by mouth daily at 6 (six) AM.   OMEGA-3 ACID ETHYL ESTERS (LOVAZA) 1 G CAPSULE    Take 1 capsule (1 g total) by mouth daily at 6 (six) AM.   SERTRALINE (ZOLOFT) 50 MG TABLET    Take 1 tablet (50 mg total) by mouth daily at 6 (six) AM.   SILDENAFIL (VIAGRA) 100 MG TABLET    Take 1 tablet (100 mg total) by mouth daily as needed for erectile dysfunction.    Patient Care Team: Juline Patch, MD as PCP - General (Family Medicine)      Objective:   Vitals: BP 120/70   Pulse 72   Resp 12   Ht 6' (1.829 m)   Wt 243 lb (110.2 kg)   BMI 32.96 kg/m   Physical Exam  Constitutional: He is oriented to person, place, and time. He appears well-developed and well-nourished.  HENT:  Head: Normocephalic.  Right Ear: External ear normal.  Left Ear: External ear normal.  Nose: Nose normal.  Mouth/Throat: Oropharynx is clear and moist.  Eyes: Conjunctivae and EOM are normal. Pupils are equal, round, and reactive to light.  Neck: Normal range of motion. Neck supple.  Cardiovascular: Normal rate, regular rhythm, normal heart sounds and intact distal pulses.   Pulmonary/Chest: Effort normal and breath sounds normal.  Abdominal: Soft. Bowel sounds are normal.  Musculoskeletal: Normal range of motion.  Neurological: He is alert and oriented to person, place, and time.  Skin: Skin is warm and dry.  Psychiatric: He has a normal mood and affect. His behavior is normal. Judgment and thought content normal.  Nursing note and vitals reviewed.    No exam data present  Activities of Daily Living In your present state of health, do you have any difficulty performing the following activities: 01/05/2017  Hearing? N  Vision? N  Difficulty concentrating or making decisions? N  Walking or climbing stairs? N  Dressing or bathing? N  Doing errands, shopping? N   Preparing Food and eating ? N  Using the Toilet? N  In the past six months, have you accidently leaked urine? N  Do you have problems with loss of bowel control? N  Managing your Medications? N  Managing your Finances? N  Housekeeping or managing your Housekeeping? N  Some recent data might be hidden    Fall Risk Assessment Fall Risk  01/05/2017 07/21/2016 07/20/2015  Falls in the past year? No No No     Patient reports there are not safety devices in place in shower at home.   Depression Screen PHQ 2/9 Scores 01/05/2017 07/21/2016 07/20/2015  PHQ - 2 Score 0 0 0    Cognitive Testing - 6-CIT   Correct? Score   What  year is it? yes 0 Yes = 0    No = 4  What month is it? yes 0 Yes = 0    No = 3  Remember:     [address]ia [address], [place], [place]     What time is it? yes 0 Yes = 0    No = 3  Count backwards from 20 to 1 yes 0 Correct = 0    1 error = 2   More than 1 error = 4  Say the months of the year in reverse. yes 0 Correct = 0    1 error = 2   More than 1 error = 4  What address did I ask you to remember? yes 0 Correct = 0  1 error = 2    2 error = 4    3 error = 6    4 error = 8    All wrong = 10       TOTAL SCORE  0/28   Interpretation:  Normal  Normal (0-7) Abnormal (8-28)     Assessment & Plan:     Initial Preventative Physical Exam  Reviewed patient's Family Medical History Reviewed and updated list of patient's medical providers Assessment of cognitive impairment was done Assessed patient's functional ability Established a written schedule for health screening [place] Completed and Reviewed  Exercise Activities and Dietary recommendations Goals    None      Immunization History  Administered Date(s) Administered  . Influenza,inj,Quad PF,36+ Mos 09/13/2015, 08/28/2016  . Pneumococcal Conjugate-13 09/18/2014  . Pneumococcal Polysaccharide-23 12/28/2012  . Tdap 01/05/2017    Health Maintenance  Topic Date Due  . Hepatitis C  Screening  1946-07-04  . TETANUS/TDAP  01/03/1967  . ZOSTAVAX  01/04/2008  . PNA vac Low Risk Adult (2 of 2 - PPSV23) 12/28/2017  . COLONOSCOPY  12/28/2024  . INFLUENZA VACCINE  Completed     Discussed health benefits of physical activity, and encouraged him to engage in regular exercise appropriate for his age and condition.    ------------------------------------------------------------------------------------------------------------  Problem List Items Addressed This Visit      Cardiovascular and Mediastinum   Essential hypertension   Relevant Orders   Renal Function Panel     Respiratory   Chronic allergic rhinitis   Relevant Medications   loratadine (CLARITIN) 10 MG tablet     Other   Hyperlipidemia   Relevant Orders   Lipid Profile    Other Visit Diagnoses    Medicare annual wellness visit, subsequent    -  Primary   Immunization due       Need for Tdap vaccination       Relevant Orders   Tdap vaccine greater than or equal to 7yo IM (Completed)       Otilio Miu, MD [facility]  01/05/2017

## 2017-01-05 NOTE — Patient Instructions (Signed)

## 2017-01-06 LAB — RENAL FUNCTION PANEL
ALBUMIN: 4.6 g/dL (ref 3.6–4.8)
BUN/Creatinine Ratio: 12 (ref 10–24)
BUN: 20 mg/dL (ref 8–27)
CO2: 24 mmol/L (ref 18–29)
CREATININE: 1.68 mg/dL — AB (ref 0.76–1.27)
Calcium: 9.7 mg/dL (ref 8.6–10.2)
Chloride: 102 mmol/L (ref 96–106)
GFR calc Af Amer: 47 mL/min/{1.73_m2} — ABNORMAL LOW (ref >59)
GFR, EST NON AFRICAN AMERICAN: 41 mL/min/{1.73_m2} — AB (ref >59)
Glucose: 106 mg/dL — ABNORMAL HIGH (ref 65–99)
PHOSPHORUS: 3.3 mg/dL (ref 2.5–4.5)
Potassium: 4.9 mmol/L (ref 3.5–5.2)
Sodium: 142 mmol/L (ref 134–144)

## 2017-01-06 LAB — LIPID PANEL
Chol/HDL Ratio: 4.5 ratio units (ref 0.0–5.0)
Cholesterol, Total: 163 mg/dL (ref 100–199)
HDL: 36 mg/dL — AB (ref >39)
LDL Calculated: 93 mg/dL (ref 0–99)
TRIGLYCERIDES: 171 mg/dL — AB (ref 0–149)
VLDL Cholesterol Cal: 34 mg/dL (ref 5–40)

## 2017-03-22 ENCOUNTER — Other Ambulatory Visit: Payer: Self-pay | Admitting: Family Medicine

## 2017-03-22 DIAGNOSIS — I10 Essential (primary) hypertension: Secondary | ICD-10-CM

## 2017-03-22 DIAGNOSIS — E785 Hyperlipidemia, unspecified: Secondary | ICD-10-CM

## 2017-04-24 ENCOUNTER — Other Ambulatory Visit: Payer: Self-pay | Admitting: Family Medicine

## 2017-04-24 DIAGNOSIS — I10 Essential (primary) hypertension: Secondary | ICD-10-CM

## 2017-05-14 ENCOUNTER — Other Ambulatory Visit: Payer: Self-pay | Admitting: Family Medicine

## 2017-05-14 DIAGNOSIS — M19042 Primary osteoarthritis, left hand: Principal | ICD-10-CM

## 2017-05-14 DIAGNOSIS — M19041 Primary osteoarthritis, right hand: Secondary | ICD-10-CM

## 2017-05-31 ENCOUNTER — Ambulatory Visit (INDEPENDENT_AMBULATORY_CARE_PROVIDER_SITE_OTHER): Payer: PPO | Admitting: Internal Medicine

## 2017-05-31 ENCOUNTER — Encounter: Payer: Self-pay | Admitting: Internal Medicine

## 2017-05-31 VITALS — BP 108/62 | HR 66 | Ht 72.0 in | Wt 236.0 lb

## 2017-05-31 DIAGNOSIS — N451 Epididymitis: Secondary | ICD-10-CM | POA: Diagnosis not present

## 2017-05-31 MED ORDER — CIPROFLOXACIN HCL 500 MG PO TABS: 500.0000 mg | ORAL_TABLET | Freq: Two times a day (BID) | ORAL | 0 refills | Status: DC

## 2017-05-31 NOTE — Progress Notes (Signed)
Date:  05/31/2017   Name:  Gary Kramer   DOB:  20-Apr-1946   MRN:  [id number]   Chief Complaint: Groin Pain (Rt side of scrotom and in groin. Was told before had infection- aching pain. When laying down at night its painful- feels like "tore groin" )  Groin Pain  The patient's pertinent negatives include no genital injury, genital itching, scrotal swelling or testicular pain. This is a new problem. The current episode started in the past 7 days. The problem occurs daily. The problem has been unchanged. Pertinent negatives include no chest pain, chills, fever or shortness of breath. Nothing aggravates the symptoms. He is not sexually active.     Review of Systems  Constitutional: Negative for chills, fatigue and fever.  Respiratory: Negative for chest tightness and shortness of breath.   Cardiovascular: Negative for chest pain.  Genitourinary: Negative for difficulty urinating, genital sores, scrotal swelling and testicular pain.    Patient Active Problem List   Diagnosis Date Noted  . Chronic allergic rhinitis 01/05/2017  . Essential hypertension 07/20/2015  . Hyperlipidemia 07/20/2015  . Erectile dysfunction 07/20/2015  . Depression 07/20/2015  . Allergic rhinitis due to pollen 07/20/2015  . Degenerative joint disease of hand 07/20/2015    Prior to Admission medications   Medication Sig Start Date End Date Taking? Authorizing Provider  amLODipine-benazepril (LOTREL) 10-20 MG capsule TAKE ONE (1) CAPSULE EACH DAY. 03/22/17  Yes Juline Patch, MD  aspirin EC 81 MG tablet Take 1 tablet by mouth daily at 6 (six) AM.   Yes [provider]  atorvastatin (LIPITOR) 20 MG tablet TAKE ONE TABLET AT BEDTIME. 03/22/17  Yes Juline Patch, MD  carvedilol (COREG) 6.25 MG tablet TAKE ONE TABLET BY MOUTH TWICE DAILY. 04/24/17  Yes Juline Patch, MD  diclofenac (VOLTAREN) 75 MG EC tablet TAKE ONE (1) TABLET BY MOUTH ONCE DAILY 05/14/17  Yes Juline Patch, MD  diclofenac  sodium (VOLTAREN) 1 % GEL Apply 4 g topically 4 (four) times daily. 01/14/16  Yes Juline Patch, MD  fenofibrate (TRICOR) 145 MG tablet Take 1 tablet (145 mg total) by mouth daily at 6 (six) AM. 07/21/16  Yes Juline Patch, MD  fluticasone (FLONASE) 50 MCG/ACT nasal spray Place 1 spray into both nostrils daily. 07/21/16  Yes Juline Patch, MD  Garlic 102 MG CAPS Take 1 capsule by mouth daily at 6 (six) AM.   Yes [provider]  hydrochlorothiazide (MICROZIDE) 12.5 MG capsule TAKE ONE (1) CAPSULE EACH DAY. AT 6AM 04/24/17  Yes Juline Patch, MD  loratadine (CLARITIN) 10 MG tablet Take 1 tablet (10 mg total) by mouth daily. 01/05/17  Yes Juline Patch, MD  Multiple Vitamin (MULTI-VITAMINS) TABS Take 1 tablet by mouth daily at 6 (six) AM.   Yes [provider]  omega-3 acid ethyl esters (LOVAZA) 1 g capsule Take 1 capsule (1 g total) by mouth daily at 6 (six) AM. 01/14/16  Yes Juline Patch, MD  sertraline (ZOLOFT) 50 MG tablet Take 1 tablet (50 mg total) by mouth daily at 6 (six) AM. 07/21/16  Yes Juline Patch, MD  sildenafil (VIAGRA) 100 MG tablet Take 1 tablet (100 mg total) by mouth daily as needed for erectile dysfunction. 07/21/16  Yes Juline Patch, MD    Allergies  Allergen Reactions  . Cefaclor Itching  . Penicillin V Potassium Rash    Past Surgical History:  Procedure Laterality Date  . COLONOSCOPY  2016   cleared for 5 yrs- Dr Candace Cruise    Social History  Substance Use Topics  . Smoking status: Former Research scientist (life sciences)  . Smokeless tobacco: Never Used  . Alcohol use 0.0 oz/week     Comment: "once in a blue moon"      Medication list has been reviewed and updated.   Physical Exam  Constitutional: He is oriented to person, place, and time. He appears well-developed. No distress.  HENT:  Head: Normocephalic and atraumatic.  Pulmonary/Chest: Effort normal. No respiratory distress.  Abdominal: Hernia confirmed negative in the right inguinal area and confirmed  negative in the left inguinal area.  Genitourinary: Testes normal.  Genitourinary Comments: Tender right epididymis  Musculoskeletal: Normal range of motion.  Lymphadenopathy:       Right: No inguinal adenopathy present.       Left: No inguinal adenopathy present.  Neurological: He is alert and oriented to person, place, and time.  Skin: Skin is warm and dry. No rash noted.  Psychiatric: He has a normal mood and affect. His behavior is normal. Thought content normal.  Nursing note and vitals reviewed.   BP 108/62   Pulse 66   Ht 6' (1.829 m)   Wt 236 lb (107 kg)   SpO2 96%   BMI 32.01 kg/m   Assessment and Plan: 1. Epididymitis, right Take tylenol as needed Follow up if needed - ciprofloxacin (CIPRO) 500 MG tablet; Take 1 tablet (500 mg total) by mouth 2 (two) times daily.  Dispense: 20 tablet; Refill: 0   Meds ordered this encounter  Medications  . ciprofloxacin (CIPRO) 500 MG tablet    Sig: Take 1 tablet (500 mg total) by mouth 2 (two) times daily.    Dispense:  20 tablet    Refill:  0    Halina Maidens, MD [facility]  05/31/2017

## 2017-05-31 NOTE — Patient Instructions (Signed)
Epididymitis Epididymitis is swelling (inflammation) of the epididymis. The epididymis is a cord-like structure that is located along the top and back part of the testicle. It collects and stores sperm from the testicle. This condition can also cause pain and swelling of the testicle and scrotum. Symptoms usually start suddenly (acute epididymitis). Sometimes epididymitis starts gradually and lasts for a while (chronic epididymitis). This type may be harder to treat. What are the causes? In men 35 and younger, this condition is usually caused by a bacterial infection or sexually transmitted disease (STD), such as:  Gonorrhea.  Chlamydia.  In men 35 and older who do not have anal sex, this condition is usually caused by bacteria from a blockage or abnormalities in the urinary system. These can result from:  Having a tube placed into the bladder (urinary catheter).  Having an enlarged or inflamed prostate gland.  Having recent urinary tract surgery.  In men who have a condition that weakens the body's defense system (immune system), such as HIV, this condition can be caused by:  Other bacteria, including tuberculosis and syphilis.  Viruses.  Fungi.  Sometimes this condition occurs without infection. That may happen if urine flows backward into the epididymis after heavy lifting or straining. What increases the risk? This condition is more likely to develop in men:  Who have unprotected sex with more than one partner.  Who have anal sex.  Who have recently had surgery.  Who have a urinary catheter.  Who have urinary problems.  Who have a suppressed immune system.  What are the signs or symptoms? This condition usually begins suddenly with chills, fever, and pain behind the scrotum and in the testicle. Other symptoms include:  Swelling of the scrotum, testicle, or both.  Pain whenejaculatingor urinating.  Pain in the back or belly.  Nausea.  Itching and discharge  from the penis.  Frequent need to pass urine.  Redness and tenderness of the scrotum.  How is this diagnosed? Your health care provider can diagnose this condition based on your symptoms and medical history. Your health care provider will also do a physical exam to ask about your symptoms and check your scrotum and testicle for swelling, pain, and redness. You may also have other tests, including:  Examination of discharge from the penis.  Urine tests for infections, such as STDs.  Your health care provider may test you for other STDs, including HIV. How is this treated? Treatment for this condition depends on the cause. If your condition is caused by a bacterial infection, oral antibiotic medicine may be prescribed. If the bacterial infection has spread to your blood, you may need to receive IV antibiotics. Nonbacterial epididymitis is treated with home care that includes bed rest and elevation of the scrotum. Surgery may be needed to treat:  Bacterial epididymitis that causes pus to build up in the scrotum (abscess).  Chronic epididymitis that has not responded to other treatments.  Follow these instructions at home: Medicines  Take over-the-counter and prescription medicines only as told by your health care provider.  If you were prescribed an antibiotic medicine, take it as told by your health care provider. Do not stop taking the antibiotic even if your condition improves. Sexual Activity  If your epididymitis was caused by an STD, avoid sexual activity until your treatment is complete.  Inform your sexual partner or partners if you test positive for an STD. They may need to be treated.Do not engage in sexual activity with your partner or   partners until their treatment is completed. General instructions  Return to your normal activities as told by your health care provider. Ask your health care provider what activities are safe for you.  Keep your scrotum elevated and  supported while resting. Ask your health care provider if you should wear a scrotal support, such as a jockstrap. Wear it as told by your health care provider.  If directed, apply ice to the affected area: ? Put ice in a plastic bag. ? Place a towel between your skin and the bag. ? Leave the ice on for 20 minutes, 2-3 times per day.  Try taking a sitz bath to help with discomfort. This is a warm water bath that is taken while you are sitting down. The water should only come up to your hips and should cover your buttocks. Do this 3-4 times per day or as told by your health care provider.  Keep all follow-up visits as told by your health care provider. This is important. Contact a health care provider if:  You have a fever.  Your pain medicine is not helping.  Your pain is getting worse.  Your symptoms do not improve within three days. This information is not intended to replace advice given to you by your health care provider. Make sure you discuss any questions you have with your health care provider. Document Released: 11/10/2000 Document Revised: 04/20/2016 Document Reviewed: 03/31/2015 Elsevier Interactive Patient Education  2018 [employer]  

## 2017-06-23 ENCOUNTER — Other Ambulatory Visit: Payer: Self-pay | Admitting: Family Medicine

## 2017-06-23 DIAGNOSIS — I10 Essential (primary) hypertension: Secondary | ICD-10-CM

## 2017-06-23 DIAGNOSIS — E785 Hyperlipidemia, unspecified: Secondary | ICD-10-CM

## 2017-06-25 ENCOUNTER — Ambulatory Visit (INDEPENDENT_AMBULATORY_CARE_PROVIDER_SITE_OTHER): Payer: PPO | Admitting: Family Medicine

## 2017-06-25 ENCOUNTER — Ambulatory Visit
Admission: RE | Admit: 2017-06-25 | Discharge: 2017-06-25 | Disposition: A | Discharge status: Home / Self Care | Payer: PPO | Source: Ambulatory Visit | Attending: Family Medicine | Admitting: Family Medicine

## 2017-06-25 ENCOUNTER — Encounter: Payer: Self-pay | Admitting: Family Medicine

## 2017-06-25 VITALS — BP 124/64 | HR 80 | Ht 72.0 in | Wt 234.0 lb

## 2017-06-25 DIAGNOSIS — M6580 Other synovitis and tenosynovitis, unspecified site: Secondary | ICD-10-CM

## 2017-06-25 DIAGNOSIS — M898X5 Other specified disorders of bone, thigh: Secondary | ICD-10-CM

## 2017-06-25 DIAGNOSIS — M76899 Other specified enthesopathies of unspecified lower limb, excluding foot: Secondary | ICD-10-CM

## 2017-06-25 DIAGNOSIS — M1611 Unilateral primary osteoarthritis, right hip: Secondary | ICD-10-CM | POA: Diagnosis not present

## 2017-06-25 MED ORDER — TRAMADOL HCL 50 MG PO TABS: 50.0000 mg | ORAL_TABLET | Freq: Three times a day (TID) | ORAL | 0 refills | Status: DC | PRN

## 2017-06-25 NOTE — Progress Notes (Signed)
Name: Marquize Seib   MRN: [id number]    DOB: 06-Sep-1946   Date:06/25/2017       Progress Note  Subjective  Chief Complaint  Chief Complaint  Patient presents with  . Groin Pain    Was given cipro for 10 days was given and helped "like it was going away" but slowly came back. This is the worst it has ever been. Having trouble sleeping and walking. Wants stronger antibiotics -     Groin Pain  The patient's primary symptoms include testicular pain. The patient's pertinent negatives include no genital injury, genital itching, genital lesions, pelvic pain, penile discharge, penile pain or scrotal swelling. Primary symptoms comment: earlier treated for epidydimitis. This is a recurrent problem. The current episode started more than 1 month ago. The problem occurs intermittently. The problem has been waxing and waning. The pain is medium. Associated symptoms include flank pain. Pertinent negatives include no abdominal pain, anorexia, chest pain, chills, constipation, coughing, diarrhea, dysuria, fever, frequency, headaches, hematuria, hesitancy, joint pain, nausea, rash, shortness of breath, sore throat, urgency or vomiting. There is no history of BPH, chlamydia or herpes simplex.    No problem-specific Assessment & Plan notes found for this encounter.   Past Medical History:  Diagnosis Date  . Allergy   . Depression   . Erectile dysfunction   . Hyperlipidemia   . Hypertension     Past Surgical History:  Procedure Laterality Date  . COLONOSCOPY  2016   cleared for 5 yrs- Dr Candace Cruise    History reviewed. No pertinent family history.  Social History   Social History  . Marital status: Married    Spouse name: N/A  . Number of children: N/A  . Years of education: N/A   Occupational History  . Not on file.   Social History Main Topics  . Smoking status: Former Research scientist (life sciences)  . Smokeless tobacco: Never Used  . Alcohol use 0.0 oz/week     Comment: "once in a blue moon"   . Drug use: No    . Sexual activity: Yes   Other Topics Concern  . Not on file   Social History Narrative  . No narrative on file    Allergies  Allergen Reactions  . Cefaclor Itching  . Penicillin V Potassium Rash    Outpatient Medications Prior to Visit  Medication Sig Dispense Refill  . amLODipine-benazepril (LOTREL) 10-20 MG capsule TAKE ONE (1) CAPSULE EACH DAY. 30 capsule 0  . aspirin EC 81 MG tablet Take 1 tablet by mouth daily at 6 (six) AM.    . atorvastatin (LIPITOR) 20 MG tablet TAKE ONE TABLET AT BEDTIME. 30 tablet 0  . carvedilol (COREG) 6.25 MG tablet TAKE (1) TABLET BY MOUTH TWICE DAILY 60 tablet 0  . diclofenac (VOLTAREN) 75 MG EC tablet TAKE ONE (1) TABLET BY MOUTH ONCE DAILY 30 tablet 2  . diclofenac sodium (VOLTAREN) 1 % GEL Apply 4 g topically 4 (four) times daily. 100 g 5  . fenofibrate (TRICOR) 145 MG tablet TAKE ONE (1) TABLET BY MOUTH ONCE DAILY AT 6AM 30 tablet 0  . fluticasone (FLONASE) 50 MCG/ACT nasal spray Place 1 spray into both nostrils daily. 1 g 11  . Garlic 409 MG CAPS Take 1 capsule by mouth daily at 6 (six) AM.    . hydrochlorothiazide (MICROZIDE) 12.5 MG capsule TAKE (1) CAPSULE BY MOUTH EVERY DAY AT 6AM 30 capsule 0  . loratadine (CLARITIN) 10 MG tablet Take 1 tablet (10 mg total)  by mouth daily. 90 tablet 3  . Multiple Vitamin (MULTI-VITAMINS) TABS Take 1 tablet by mouth daily at 6 (six) AM.    . omega-3 acid ethyl esters (LOVAZA) 1 g capsule Take 1 capsule (1 g total) by mouth daily at 6 (six) AM. 30 capsule 6  . sertraline (ZOLOFT) 50 MG tablet Take 1 tablet (50 mg total) by mouth daily at 6 (six) AM. 30 tablet 6  . sildenafil (VIAGRA) 100 MG tablet Take 1 tablet (100 mg total) by mouth daily as needed for erectile dysfunction. 10 tablet 5  . ciprofloxacin (CIPRO) 500 MG tablet Take 1 tablet (500 mg total) by mouth 2 (two) times daily. (Patient not taking: Reported on 06/25/2017) 20 tablet 0   No facility-administered medications prior to visit.     Review  of Systems  Constitutional: Negative for chills, fever, malaise/fatigue and weight loss.  HENT: Negative for ear discharge, ear pain and sore throat.   Eyes: Negative for blurred vision.  Respiratory: Negative for cough, sputum production, shortness of breath and wheezing.   Cardiovascular: Negative for chest pain, palpitations and leg swelling.  Gastrointestinal: Negative for abdominal pain, anorexia, blood in stool, constipation, diarrhea, heartburn, melena, nausea and vomiting.  Genitourinary: Positive for flank pain and testicular pain. Negative for discharge, dysuria, frequency, hematuria, hesitancy, pelvic pain, penile pain, scrotal swelling and urgency.  Musculoskeletal: Negative for back pain, joint pain, myalgias and neck pain.  Skin: Negative for rash.  Neurological: Negative for dizziness, tingling, sensory change, focal weakness and headaches.  Endo/Heme/Allergies: Negative for environmental allergies and polydipsia. Does not bruise/bleed easily.  Psychiatric/Behavioral: Negative for depression and suicidal ideas. The patient is not nervous/anxious and does not have insomnia.      Objective  Vitals:   06/25/17 1102  BP: 124/64  Pulse: 80  SpO2: 97%  Weight: 234 lb (106.1 kg)  Height: 6' (1.829 m)    Physical Exam  Constitutional: He is oriented to person, place, and time and well-developed, well-nourished, and in no distress.  HENT:  Head: Normocephalic.  Right Ear: External ear normal.  Left Ear: External ear normal.  Nose: Nose normal.  Mouth/Throat: Oropharynx is clear and moist.  Eyes: Pupils are equal, round, and reactive to light. Conjunctivae and EOM are normal. Right eye exhibits no discharge. Left eye exhibits no discharge. No scleral icterus.  Neck: Normal range of motion. Neck supple. No JVD present. No tracheal deviation present. No thyromegaly present.  Cardiovascular: Normal rate, regular rhythm, normal heart sounds and intact distal pulses.  Exam  reveals no gallop and no friction rub.   No murmur heard. Pulmonary/Chest: Breath sounds normal. No respiratory distress. He has no wheezes. He has no rales.  Abdominal: Soft. Bowel sounds are normal. He exhibits no mass. There is no hepatosplenomegaly. There is no tenderness. There is no rebound, no guarding and no CVA tenderness.  Genitourinary: He exhibits no abnormal testicular mass, no testicular tenderness, no abnormal scrotal mass and no scrotal tenderness.  Musculoskeletal: Normal range of motion. He exhibits tenderness. He exhibits no edema.       Right hip: He exhibits tenderness.       Legs: Lymphadenopathy:    He has no cervical adenopathy.  Neurological: He is alert and oriented to person, place, and time. He has normal sensation, normal strength, normal reflexes and intact cranial nerves. No cranial nerve deficit.  Skin: Skin is warm. No rash noted.  Psychiatric: Mood and affect normal.  Nursing note and vitals reviewed.  Assessment & Plan  Problem List Items Addressed This Visit    None    Visit Diagnoses    Adductor tendinitis    -  Primary   Relevant Orders   Ambulatory referral to Orthopedic Surgery   DG FEMUR, MIN 2 VIEWS RIGHT   Pain in right femur       [place] control substance consulted   Relevant Orders   Ambulatory referral to Orthopedic Surgery   DG FEMUR, MIN 2 VIEWS RIGHT      Meds ordered this encounter  Medications  . traMADol (ULTRAM) 50 MG tablet    Sig: Take 1 tablet (50 mg total) by mouth every 8 (eight) hours as needed.    Dispense:  30 tablet    Refill:  0      Dr. Deanna Jones [facility]  06/25/17

## 2017-06-26 ENCOUNTER — Other Ambulatory Visit: Payer: Self-pay

## 2017-06-26 DIAGNOSIS — M25561 Pain in right knee: Secondary | ICD-10-CM

## 2017-06-26 DIAGNOSIS — M25551 Pain in right hip: Secondary | ICD-10-CM

## 2017-06-29 DIAGNOSIS — M1711 Unilateral primary osteoarthritis, right knee: Secondary | ICD-10-CM | POA: Diagnosis not present

## 2017-06-29 DIAGNOSIS — M1611 Unilateral primary osteoarthritis, right hip: Secondary | ICD-10-CM | POA: Diagnosis not present

## 2017-06-29 DIAGNOSIS — E669 Obesity, unspecified: Secondary | ICD-10-CM | POA: Diagnosis not present

## 2017-07-02 DIAGNOSIS — L570 Actinic keratosis: Secondary | ICD-10-CM | POA: Diagnosis not present

## 2017-07-02 DIAGNOSIS — L820 Inflamed seborrheic keratosis: Secondary | ICD-10-CM | POA: Diagnosis not present

## 2017-07-02 DIAGNOSIS — L578 Other skin changes due to chronic exposure to nonionizing radiation: Secondary | ICD-10-CM | POA: Diagnosis not present

## 2017-07-02 DIAGNOSIS — L918 Other hypertrophic disorders of the skin: Secondary | ICD-10-CM | POA: Diagnosis not present

## 2017-07-02 DIAGNOSIS — L821 Other seborrheic keratosis: Secondary | ICD-10-CM | POA: Diagnosis not present

## 2017-07-10 ENCOUNTER — Ambulatory Visit (INDEPENDENT_AMBULATORY_CARE_PROVIDER_SITE_OTHER): Payer: PPO | Admitting: Family Medicine

## 2017-07-10 ENCOUNTER — Encounter: Payer: Self-pay | Admitting: Family Medicine

## 2017-07-10 DIAGNOSIS — E785 Hyperlipidemia, unspecified: Secondary | ICD-10-CM

## 2017-07-10 DIAGNOSIS — I10 Essential (primary) hypertension: Secondary | ICD-10-CM

## 2017-07-10 DIAGNOSIS — N529 Male erectile dysfunction, unspecified: Secondary | ICD-10-CM | POA: Diagnosis not present

## 2017-07-10 DIAGNOSIS — F3341 Major depressive disorder, recurrent, in partial remission: Secondary | ICD-10-CM

## 2017-07-10 DIAGNOSIS — J301 Allergic rhinitis due to pollen: Secondary | ICD-10-CM

## 2017-07-10 MED ORDER — FENOFIBRATE 145 MG PO TABS: ORAL_TABLET | ORAL | 6 refills | Status: DC

## 2017-07-10 MED ORDER — LORATADINE 10 MG PO TABS: 10.0000 mg | ORAL_TABLET | Freq: Every day | ORAL | 3 refills | Status: DC

## 2017-07-10 MED ORDER — FLUTICASONE PROPIONATE 50 MCG/ACT NA SUSP: 1.0000 | Freq: Every day | NASAL | 11 refills | Status: DC

## 2017-07-10 MED ORDER — SILDENAFIL CITRATE 100 MG PO TABS: 100.0000 mg | ORAL_TABLET | Freq: Every day | ORAL | 5 refills | Status: DC | PRN

## 2017-07-10 MED ORDER — SERTRALINE HCL 50 MG PO TABS: 50.0000 mg | ORAL_TABLET | Freq: Every day | ORAL | 6 refills | Status: DC

## 2017-07-10 MED ORDER — ATORVASTATIN CALCIUM 20 MG PO TABS: 20.0000 mg | ORAL_TABLET | Freq: Every day | ORAL | 6 refills | Status: DC

## 2017-07-10 MED ORDER — AMLODIPINE BESY-BENAZEPRIL HCL 10-20 MG PO CAPS: ORAL_CAPSULE | ORAL | 6 refills | Status: DC

## 2017-07-10 MED ORDER — CARVEDILOL 6.25 MG PO TABS: ORAL_TABLET | ORAL | 6 refills | Status: DC

## 2017-07-10 MED ORDER — HYDROCHLOROTHIAZIDE 12.5 MG PO CAPS: ORAL_CAPSULE | ORAL | 6 refills | Status: DC

## 2017-07-10 MED ORDER — TRAMADOL HCL 50 MG PO TABS: 50.0000 mg | ORAL_TABLET | Freq: Three times a day (TID) | ORAL | 0 refills | Status: DC | PRN

## 2017-07-10 NOTE — Progress Notes (Signed)
Name: Gary Kramer   MRN: [id number]    DOB: 05/04/1946   Date:07/10/2017       Progress Note  Subjective  Chief Complaint  Chief Complaint  Patient presents with  . Hypertension  . Hyperlipidemia  . Knee Pain    wants a refill on Tramadol- has appt with Chasnis in 2 weeks  . Allergic Rhinitis   . Depression    Hypertension  This is a chronic problem. The current episode started more than 1 year ago. The problem is controlled. Pertinent negatives include no anxiety, blurred vision, chest pain, headaches, malaise/fatigue, neck pain, orthopnea, palpitations, peripheral edema, PND, shortness of breath or sweats. There are no associated agents to hypertension. Risk factors for coronary artery disease include male gender and dyslipidemia. Past treatments include ACE inhibitors, calcium channel blockers, beta blockers and diuretics. There are no compliance problems.  There is no history of angina, kidney disease, CAD/MI, CVA, heart failure, left ventricular hypertrophy, PVD or retinopathy. There is no history of chronic renal disease, a hypertension causing med or renovascular disease.  Hyperlipidemia  This is a chronic problem. The problem is controlled. Recent lipid tests were reviewed and are normal. He has no history of chronic renal disease, diabetes, hypothyroidism, liver disease, obesity or nephrotic syndrome. Factors aggravating his hyperlipidemia include thiazides. Pertinent negatives include no chest pain, focal sensory loss, focal weakness, leg pain, myalgias or shortness of breath. Current antihyperlipidemic treatment includes statins and fibric acid derivatives. The current treatment provides moderate improvement of lipids. There are no compliance problems.  Risk factors for coronary artery disease include dyslipidemia.  Knee Pain   The incident occurred more than 1 week ago. There was no injury mechanism. The pain is present in the right knee and left knee. The pain is at a severity  of 4/10. The pain is moderate. The pain has been fluctuating since onset. Associated symptoms include an inability to bear weight. Pertinent negatives include no loss of motion, loss of sensation, muscle weakness, numbness or tingling. The symptoms are aggravated by movement. He has tried NSAIDs for the symptoms. The treatment provided mild relief.  Depression       The patient presents with depression.  This is a chronic problem.  The onset quality is gradual.   The problem has been gradually improving since onset.  Associated symptoms include no decreased concentration, no fatigue, no helplessness, no hopelessness, does not have insomnia, not irritable, no restlessness, no decreased interest, no appetite change, no body aches, no myalgias, no headaches, no indigestion, not sad and no suicidal ideas.     The symptoms are aggravated by family issues.  Past treatments include SSRIs - Selective serotonin reuptake inhibitors.  Past medical history includes depression.     Pertinent negatives include no hypothyroidism and no anxiety. Leg Pain   The incident occurred more than 1 week ago. There was no injury mechanism. The pain is present in the right hip and right thigh. The pain is at a severity of 8/10. The pain is moderate. The pain has been intermittent since onset. Associated symptoms include an inability to bear weight. Pertinent negatives include no loss of motion, loss of sensation, muscle weakness, numbness or tingling.    No problem-specific Assessment & Plan notes found for this encounter.   Past Medical History:  Diagnosis Date  . Allergy   . Depression   . Erectile dysfunction   . Hyperlipidemia   . Hypertension     Past Surgical History:  Procedure Laterality Date  . COLONOSCOPY  2016   cleared for 5 yrs- Dr Candace Cruise    No family history on file.  Social History   Social History  . Marital status: Married    Spouse name: N/A  . Number of children: N/A  . Years of education: N/A    Occupational History  . Not on file.   Social History Main Topics  . Smoking status: Former Research scientist (life sciences)  . Smokeless tobacco: Never Used  . Alcohol use 0.0 oz/week     Comment: "once in a blue moon"   . Drug use: No  . Sexual activity: Yes   Other Topics Concern  . Not on file   Social History Narrative  . No narrative on file    Allergies  Allergen Reactions  . Cefaclor Itching  . Penicillin V Potassium Rash    Outpatient Medications Prior to Visit  Medication Sig Dispense Refill  . aspirin EC 81 MG tablet Take 1 tablet by mouth daily at 6 (six) AM.    . diclofenac (VOLTAREN) 75 MG EC tablet TAKE ONE (1) TABLET BY MOUTH ONCE DAILY 30 tablet 2  . diclofenac sodium (VOLTAREN) 1 % GEL Apply 4 g topically 4 (four) times daily. 100 g 5  . Garlic 916 MG CAPS Take 1 capsule by mouth daily at 6 (six) AM.    . Multiple Vitamin (MULTI-VITAMINS) TABS Take 1 tablet by mouth daily at 6 (six) AM.    . omega-3 acid ethyl esters (LOVAZA) 1 g capsule Take 1 capsule (1 g total) by mouth daily at 6 (six) AM. 30 capsule 6  . amLODipine-benazepril (LOTREL) 10-20 MG capsule TAKE ONE (1) CAPSULE EACH DAY. 30 capsule 0  . atorvastatin (LIPITOR) 20 MG tablet TAKE ONE TABLET AT BEDTIME. 30 tablet 0  . carvedilol (COREG) 6.25 MG tablet TAKE (1) TABLET BY MOUTH TWICE DAILY 60 tablet 0  . fenofibrate (TRICOR) 145 MG tablet TAKE ONE (1) TABLET BY MOUTH ONCE DAILY AT 6AM 30 tablet 0  . fluticasone (FLONASE) 50 MCG/ACT nasal spray Place 1 spray into both nostrils daily. 1 g 11  . hydrochlorothiazide (MICROZIDE) 12.5 MG capsule TAKE (1) CAPSULE BY MOUTH EVERY DAY AT 6AM 30 capsule 0  . loratadine (CLARITIN) 10 MG tablet Take 1 tablet (10 mg total) by mouth daily. 90 tablet 3  . sertraline (ZOLOFT) 50 MG tablet Take 1 tablet (50 mg total) by mouth daily at 6 (six) AM. 30 tablet 6  . sildenafil (VIAGRA) 100 MG tablet Take 1 tablet (100 mg total) by mouth daily as needed for erectile dysfunction. 10 tablet 5  .  traMADol (ULTRAM) 50 MG tablet Take 1 tablet (50 mg total) by mouth every 8 (eight) hours as needed. 30 tablet 0   No facility-administered medications prior to visit.     Review of Systems  Constitutional: Negative for appetite change, chills, fatigue, fever, malaise/fatigue and weight loss.  HENT: Negative for ear discharge, ear pain and sore throat.   Eyes: Negative for blurred vision.  Respiratory: Negative for cough, sputum production, shortness of breath and wheezing.   Cardiovascular: Negative for chest pain, palpitations, orthopnea, leg swelling and PND.  Gastrointestinal: Negative for abdominal pain, blood in stool, constipation, diarrhea, heartburn, melena and nausea.  Genitourinary: Negative for dysuria, frequency, hematuria and urgency.  Musculoskeletal: Negative for back pain, joint pain, myalgias and neck pain.  Skin: Negative for rash.  Neurological: Negative for dizziness, tingling, sensory change, focal weakness, numbness and headaches.  Endo/Heme/Allergies: Negative for environmental allergies and polydipsia. Does not bruise/bleed easily.  Psychiatric/Behavioral: Positive for depression. Negative for decreased concentration and suicidal ideas. The patient is not nervous/anxious and does not have insomnia.      Objective  Vitals:   07/10/17 1335  BP: 100/62  Pulse: 76  Weight: 234 lb (106.1 kg)  Height: 6' (1.829 m)    Physical Exam  Constitutional: He is oriented to person, place, and time and well-developed, well-nourished, and in no distress. He is not irritable.  HENT:  Head: Normocephalic.  Right Ear: External ear normal.  Left Ear: External ear normal.  Nose: Nose normal.  Mouth/Throat: Oropharynx is clear and moist.  Eyes: Pupils are equal, round, and reactive to light. Conjunctivae and EOM are normal. Right eye exhibits no discharge. Left eye exhibits no discharge. No scleral icterus.  Neck: Normal range of motion. Neck supple. No JVD present. No  tracheal deviation present. No thyromegaly present.  Cardiovascular: Normal rate, regular rhythm, normal heart sounds and intact distal pulses.  Exam reveals no gallop and no friction rub.   No murmur heard. Pulmonary/Chest: Breath sounds normal. No respiratory distress. He has no wheezes. He has no rales.  Abdominal: Soft. Bowel sounds are normal. He exhibits no mass. There is no hepatosplenomegaly. There is no tenderness. There is no rebound, no guarding and no CVA tenderness.  Musculoskeletal: Normal range of motion. He exhibits no edema or tenderness.  Lymphadenopathy:    He has no cervical adenopathy.  Neurological: He is alert and oriented to person, place, and time. He has normal sensation, normal strength, normal reflexes and intact cranial nerves. No cranial nerve deficit.  Skin: Skin is warm. No rash noted.  Psychiatric: Mood and affect normal.  Nursing note and vitals reviewed.     Assessment & Plan  Problem List Items Addressed This Visit      Cardiovascular and Mediastinum   Essential hypertension   Relevant Medications   sildenafil (VIAGRA) 100 MG tablet   carvedilol (COREG) 6.25 MG tablet   amLODipine-benazepril (LOTREL) 10-20 MG capsule   fenofibrate (TRICOR) 145 MG tablet   atorvastatin (LIPITOR) 20 MG tablet   hydrochlorothiazide (MICROZIDE) 12.5 MG capsule   Other Relevant Orders   Renal Function Panel     Respiratory   Allergic rhinitis due to pollen   Relevant Medications   fluticasone (FLONASE) 50 MCG/ACT nasal spray     Genitourinary   Erectile dysfunction   Relevant Medications   sildenafil (VIAGRA) 100 MG tablet     Other   Hyperlipidemia   Relevant Medications   sildenafil (VIAGRA) 100 MG tablet   carvedilol (COREG) 6.25 MG tablet   amLODipine-benazepril (LOTREL) 10-20 MG capsule   fenofibrate (TRICOR) 145 MG tablet   atorvastatin (LIPITOR) 20 MG tablet   hydrochlorothiazide (MICROZIDE) 12.5 MG capsule   Other Relevant Orders   Lipid  Profile   Depression   Relevant Medications   sertraline (ZOLOFT) 50 MG tablet      Meds ordered this encounter  Medications  . loratadine (CLARITIN) 10 MG tablet    Sig: Take 1 tablet (10 mg total) by mouth daily.    Dispense:  90 tablet    Refill:  3  . sertraline (ZOLOFT) 50 MG tablet    Sig: Take 1 tablet (50 mg total) by mouth daily at 6 (six) AM.    Dispense:  30 tablet    Refill:  6  . sildenafil (VIAGRA) 100 MG tablet    Sig:  Take 1 tablet (100 mg total) by mouth daily as needed for erectile dysfunction.    Dispense:  10 tablet    Refill:  5  . carvedilol (COREG) 6.25 MG tablet    Sig: TAKE (1) TABLET BY MOUTH TWICE DAILY    Dispense:  60 tablet    Refill:  6  . amLODipine-benazepril (LOTREL) 10-20 MG capsule    Sig: TAKE ONE (1) CAPSULE EACH DAY.    Dispense:  30 capsule    Refill:  6  . fenofibrate (TRICOR) 145 MG tablet    Sig: TAKE ONE (1) TABLET BY MOUTH ONCE DAILY AT 6AM    Dispense:  30 tablet    Refill:  6  . atorvastatin (LIPITOR) 20 MG tablet    Sig: Take 1 tablet (20 mg total) by mouth at bedtime.    Dispense:  30 tablet    Refill:  6  . fluticasone (FLONASE) 50 MCG/ACT nasal spray    Sig: Place 1 spray into both nostrils daily.    Dispense:  1 g    Refill:  11  . traMADol (ULTRAM) 50 MG tablet    Sig: Take 1 tablet (50 mg total) by mouth every 8 (eight) hours as needed.    Dispense:  42 tablet    Refill:  0  . hydrochlorothiazide (MICROZIDE) 12.5 MG capsule    Sig: TAKE (1) CAPSULE BY MOUTH EVERY DAY AT 6AM    Dispense:  30 capsule    Refill:  6      Dr. Deanna Jones [facility]  07/10/17

## 2017-07-11 LAB — RENAL FUNCTION PANEL
ALBUMIN: 4.7 g/dL (ref 3.6–4.8)
BUN / CREAT RATIO: 10 (ref 10–24)
BUN: 18 mg/dL (ref 8–27)
CHLORIDE: 104 mmol/L (ref 96–106)
CO2: 21 mmol/L (ref 20–29)
Calcium: 10.2 mg/dL (ref 8.6–10.2)
Creatinine, Ser: 1.79 mg/dL — ABNORMAL HIGH (ref 0.76–1.27)
GFR, EST AFRICAN AMERICAN: 44 mL/min/{1.73_m2} — AB (ref >59)
GFR, EST NON AFRICAN AMERICAN: 38 mL/min/{1.73_m2} — AB (ref >59)
GLUCOSE: 99 mg/dL (ref 65–99)
POTASSIUM: 4.5 mmol/L (ref 3.5–5.2)
Phosphorus: 3.3 mg/dL (ref 2.5–4.5)
Sodium: 144 mmol/L (ref 134–144)

## 2017-07-11 LAB — LIPID PANEL
CHOL/HDL RATIO: 4.2 ratio (ref 0.0–5.0)
Cholesterol, Total: 156 mg/dL (ref 100–199)
HDL: 37 mg/dL — AB (ref >39)
LDL Calculated: 84 mg/dL (ref 0–99)
Triglycerides: 175 mg/dL — ABNORMAL HIGH (ref 0–149)
VLDL CHOLESTEROL CAL: 35 mg/dL (ref 5–40)

## 2017-07-26 DIAGNOSIS — M1611 Unilateral primary osteoarthritis, right hip: Secondary | ICD-10-CM | POA: Diagnosis not present

## 2017-08-17 ENCOUNTER — Encounter: Payer: Self-pay | Admitting: Family Medicine

## 2017-08-17 ENCOUNTER — Ambulatory Visit (INDEPENDENT_AMBULATORY_CARE_PROVIDER_SITE_OTHER): Payer: PPO | Admitting: Family Medicine

## 2017-08-17 VITALS — BP 120/80 | HR 80 | Ht 72.0 in | Wt 230.0 lb

## 2017-08-17 DIAGNOSIS — Z23 Encounter for immunization: Secondary | ICD-10-CM

## 2017-08-17 DIAGNOSIS — S39012A Strain of muscle, fascia and tendon of lower back, initial encounter: Secondary | ICD-10-CM

## 2017-08-17 MED ORDER — TRAMADOL HCL 50 MG PO TABS: 50.0000 mg | ORAL_TABLET | Freq: Three times a day (TID) | ORAL | 0 refills | Status: DC | PRN

## 2017-08-17 MED ORDER — MELOXICAM 15 MG PO TABS: 15.0000 mg | ORAL_TABLET | Freq: Every day | ORAL | 0 refills | Status: DC

## 2017-08-17 MED ORDER — CYCLOBENZAPRINE HCL 10 MG PO TABS: 10.0000 mg | ORAL_TABLET | Freq: Three times a day (TID) | ORAL | 0 refills | Status: DC | PRN

## 2017-08-17 NOTE — Progress Notes (Signed)
Name: Gary Kramer   MRN: [id number]    DOB: [birth year]   Date:08/17/2017       Progress Note  Subjective  Chief Complaint  Chief Complaint  Patient presents with  . Back Pain    picked up a big case of water and plastic ripped, package dropped suddenly and pulled back. Has been having spasms    Back Pain  This is a new problem. The current episode started in the past 7 days (Monday night ). The problem occurs constantly. The problem has been waxing and waning since onset. The pain is present in the lumbar spine. The quality of the pain is described as cramping and stabbing. The pain does not radiate. The pain is at a severity of 5/10. The pain is moderate. The symptoms are aggravated by bending and twisting. Associated symptoms include tingling. Pertinent negatives include no abdominal pain, bladder incontinence, bowel incontinence, chest pain, dysuria, fever, headaches, numbness, paresis, paresthesias or weight loss. He has tried muscle relaxant and bed rest for the symptoms. The treatment provided moderate relief.    No problem-specific Assessment & Plan notes found for this encounter.   Past Medical History:  Diagnosis Date  . Allergy   . Depression   . Erectile dysfunction   . Hyperlipidemia   . Hypertension     Past Surgical History:  Procedure Laterality Date  . COLONOSCOPY  2016   cleared for 5 yrs- Dr Candace Cruise    No family history on file.  Social History   Social History  . Marital status: Married    Spouse name: N/A  . Number of children: N/A  . Years of education: N/A   Occupational History  . Not on file.   Social History Main Topics  . Smoking status: Former Research scientist (life sciences)  . Smokeless tobacco: Never Used  . Alcohol use 0.0 oz/week     Comment: "once in a blue moon"   . Drug use: No  . Sexual activity: Yes   Other Topics Concern  . Not on file   Social History Narrative  . No narrative on file    Allergies  Allergen Reactions  . Cefaclor Itching   . Penicillin V Potassium Rash    Outpatient Medications Prior to Visit  Medication Sig Dispense Refill  . amLODipine-benazepril (LOTREL) 10-20 MG capsule TAKE ONE (1) CAPSULE EACH DAY. 30 capsule 6  . aspirin EC 81 MG tablet Take 1 tablet by mouth daily at 6 (six) AM.    . atorvastatin (LIPITOR) 20 MG tablet Take 1 tablet (20 mg total) by mouth at bedtime. 30 tablet 6  . carvedilol (COREG) 6.25 MG tablet TAKE (1) TABLET BY MOUTH TWICE DAILY 60 tablet 6  . diclofenac (VOLTAREN) 75 MG EC tablet TAKE ONE (1) TABLET BY MOUTH ONCE DAILY 30 tablet 2  . diclofenac sodium (VOLTAREN) 1 % GEL Apply 4 g topically 4 (four) times daily. 100 g 5  . fenofibrate (TRICOR) 145 MG tablet TAKE ONE (1) TABLET BY MOUTH ONCE DAILY AT 6AM 30 tablet 6  . fluticasone (FLONASE) 50 MCG/ACT nasal spray Place 1 spray into both nostrils daily. 1 g 11  . Garlic 093 MG CAPS Take 1 capsule by mouth daily at 6 (six) AM.    . hydrochlorothiazide (MICROZIDE) 12.5 MG capsule TAKE (1) CAPSULE BY MOUTH EVERY DAY AT 6AM 30 capsule 6  . loratadine (CLARITIN) 10 MG tablet Take 1 tablet (10 mg total) by mouth daily. 90 tablet 3  . Multiple  Vitamin (MULTI-VITAMINS) TABS Take 1 tablet by mouth daily at 6 (six) AM.    . omega-3 acid ethyl esters (LOVAZA) 1 g capsule Take 1 capsule (1 g total) by mouth daily at 6 (six) AM. 30 capsule 6  . sertraline (ZOLOFT) 50 MG tablet Take 1 tablet (50 mg total) by mouth daily at 6 (six) AM. 30 tablet 6  . sildenafil (VIAGRA) 100 MG tablet Take 1 tablet (100 mg total) by mouth daily as needed for erectile dysfunction. 10 tablet 5  . traMADol (ULTRAM) 50 MG tablet Take 1 tablet (50 mg total) by mouth every 8 (eight) hours as needed. (Patient not taking: Reported on 08/17/2017) 42 tablet 0   No facility-administered medications prior to visit.     Review of Systems  Constitutional: Negative for chills, fever, malaise/fatigue and weight loss.  HENT: Negative for ear discharge, ear pain and sore throat.    Eyes: Negative for blurred vision.  Respiratory: Negative for cough, sputum production, shortness of breath and wheezing.   Cardiovascular: Negative for chest pain, palpitations and leg swelling.  Gastrointestinal: Negative for abdominal pain, blood in stool, bowel incontinence, constipation, diarrhea, heartburn, melena and nausea.  Genitourinary: Negative for bladder incontinence, dysuria, frequency, hematuria and urgency.  Musculoskeletal: Positive for back pain. Negative for joint pain, myalgias and neck pain.  Skin: Negative for rash.  Neurological: Positive for tingling. Negative for dizziness, sensory change, focal weakness, numbness, headaches and paresthesias.  Endo/Heme/Allergies: Negative for environmental allergies and polydipsia. Does not bruise/bleed easily.  Psychiatric/Behavioral: Negative for depression and suicidal ideas. The patient is not nervous/anxious and does not have insomnia.      Objective  Vitals:   08/17/17 1446  BP: 120/80  Pulse: 80  Weight: 230 lb (104.3 kg)  Height: 6' (1.829 m)    Physical Exam  Constitutional: He is oriented to person, place, and time and well-developed, well-nourished, and in no distress.  HENT:  Head: Normocephalic.  Right Ear: External ear normal.  Left Ear: External ear normal.  Nose: Nose normal.  Mouth/Throat: Oropharynx is clear and moist.  Eyes: Pupils are equal, round, and reactive to light. Conjunctivae and EOM are normal. Right eye exhibits no discharge. Left eye exhibits no discharge. No scleral icterus.  Neck: Normal range of motion. Neck supple. No JVD present. No tracheal deviation present. No thyromegaly present.  Cardiovascular: Normal rate, regular rhythm, normal heart sounds and intact distal pulses.  Exam reveals no gallop and no friction rub.   No murmur heard. Pulmonary/Chest: Breath sounds normal. No respiratory distress. He has no wheezes. He has no rales.  Abdominal: Soft. Bowel sounds are normal. He  exhibits no mass. There is no hepatosplenomegaly. There is no tenderness. There is no rebound, no guarding and no CVA tenderness.  Musculoskeletal: He exhibits no edema.       Lumbar back: He exhibits decreased range of motion, tenderness and spasm.  Lymphadenopathy:    He has no cervical adenopathy.  Neurological: He is alert and oriented to person, place, and time. He has normal sensation, normal strength, normal reflexes and intact cranial nerves. No cranial nerve deficit.  Skin: Skin is warm. No rash noted.  Psychiatric: Mood and affect normal.  Nursing note and vitals reviewed.     Assessment & Plan  Problem List Items Addressed This Visit    None    Visit Diagnoses    Acute myofascial strain of lumbar region, initial encounter    -  Primary   Relevant Medications  cyclobenzaprine (FLEXERIL) 10 MG tablet   meloxicam (MOBIC) 15 MG tablet   traMADol (ULTRAM) 50 MG tablet   Influenza vaccine needed       Relevant Orders   Flu vaccine HIGH DOSE PF (Fluzone High dose) (Completed)      Meds ordered this encounter  Medications  . cyclobenzaprine (FLEXERIL) 10 MG tablet    Sig: Take 1 tablet (10 mg total) by mouth 3 (three) times daily as needed for muscle spasms.    Dispense:  30 tablet    Refill:  0  . meloxicam (MOBIC) 15 MG tablet    Sig: Take 1 tablet (15 mg total) by mouth daily.    Dispense:  30 tablet    Refill:  0  . traMADol (ULTRAM) 50 MG tablet    Sig: Take 1 tablet (50 mg total) by mouth every 8 (eight) hours as needed.    Dispense:  30 tablet    Refill:  0      Dr. Jaem[facility]  08/17/17

## 2017-08-23 DIAGNOSIS — E669 Obesity, unspecified: Secondary | ICD-10-CM | POA: Diagnosis not present

## 2017-08-23 DIAGNOSIS — M1611 Unilateral primary osteoarthritis, right hip: Secondary | ICD-10-CM | POA: Diagnosis not present

## 2017-09-12 ENCOUNTER — Other Ambulatory Visit: Payer: Self-pay | Admitting: Family Medicine

## 2017-09-12 DIAGNOSIS — M19041 Primary osteoarthritis, right hand: Secondary | ICD-10-CM

## 2017-09-12 DIAGNOSIS — M19042 Primary osteoarthritis, left hand: Principal | ICD-10-CM

## 2017-10-15 ENCOUNTER — Other Ambulatory Visit: Payer: Self-pay | Admitting: Family Medicine

## 2017-10-22 ENCOUNTER — Other Ambulatory Visit: Payer: Self-pay | Admitting: Family Medicine

## 2017-10-22 DIAGNOSIS — E785 Hyperlipidemia, unspecified: Secondary | ICD-10-CM

## 2017-11-09 ENCOUNTER — Other Ambulatory Visit: Payer: Self-pay | Admitting: Family Medicine

## 2017-11-09 DIAGNOSIS — M19042 Primary osteoarthritis, left hand: Principal | ICD-10-CM

## 2017-11-09 DIAGNOSIS — M19041 Primary osteoarthritis, right hand: Secondary | ICD-10-CM

## 2017-11-22 ENCOUNTER — Other Ambulatory Visit: Payer: Self-pay | Admitting: Family Medicine

## 2017-12-04 DIAGNOSIS — M1611 Unilateral primary osteoarthritis, right hip: Secondary | ICD-10-CM | POA: Diagnosis not present

## 2017-12-11 ENCOUNTER — Other Ambulatory Visit: Payer: Self-pay | Admitting: Family Medicine

## 2017-12-18 DIAGNOSIS — R69 Illness, unspecified: Secondary | ICD-10-CM | POA: Diagnosis not present

## 2017-12-26 DIAGNOSIS — M1611 Unilateral primary osteoarthritis, right hip: Secondary | ICD-10-CM | POA: Diagnosis not present

## 2017-12-26 DIAGNOSIS — M1712 Unilateral primary osteoarthritis, left knee: Secondary | ICD-10-CM | POA: Diagnosis not present

## 2017-12-26 DIAGNOSIS — G8929 Other chronic pain: Secondary | ICD-10-CM | POA: Diagnosis not present

## 2017-12-26 DIAGNOSIS — M25562 Pain in left knee: Secondary | ICD-10-CM | POA: Diagnosis not present

## 2017-12-27 ENCOUNTER — Ambulatory Visit (INDEPENDENT_AMBULATORY_CARE_PROVIDER_SITE_OTHER): Payer: Medicare HMO | Admitting: Family Medicine

## 2017-12-27 ENCOUNTER — Encounter: Payer: Self-pay | Admitting: Family Medicine

## 2017-12-27 VITALS — BP 130/80 | HR 80 | Ht 72.0 in | Wt 230.0 lb

## 2017-12-27 DIAGNOSIS — I10 Essential (primary) hypertension: Secondary | ICD-10-CM

## 2017-12-27 DIAGNOSIS — E785 Hyperlipidemia, unspecified: Secondary | ICD-10-CM | POA: Diagnosis not present

## 2017-12-27 DIAGNOSIS — Z01818 Encounter for other preprocedural examination: Secondary | ICD-10-CM | POA: Diagnosis not present

## 2017-12-27 NOTE — Progress Notes (Signed)
bName: Gary Kramer   MRN: [id number]    DOB: 06/04/1946   Date:12/27/2017       Progress Note  Subjective  Chief Complaint  Chief Complaint  Patient presents with  . Pre-op Exam    having L) knee surgery with Dr Leanor Kail    Patient present preoperative medical evaluation.  Other  Chronicity: surgical clearance/knee pain. The problem occurs constantly. The problem has been waxing and waning. Associated symptoms include joint swelling. Pertinent negatives include no abdominal pain, chest pain, chills, congestion, coughing, fever, headaches, myalgias, nausea, neck pain, rash or sore throat. The symptoms are aggravated by walking. He has tried oral narcotics for the symptoms.    No problem-specific Assessment & Plan notes found for this encounter.   Past Medical History:  Diagnosis Date  . Allergy   . Depression   . Erectile dysfunction   . Hyperlipidemia   . Hypertension     Past Surgical History:  Procedure Laterality Date  . COLONOSCOPY  2016   cleared for 5 yrs- Dr Candace Cruise    History reviewed. No pertinent family history.  Social History   Socioeconomic History  . Marital status: Married    Spouse name: Not on file  . Number of children: Not on file  . Years of education: Not on file  . Highest education level: Not on file  Social Needs  . Financial resource strain: Not on file  . Food insecurity - worry: Not on file  . Food insecurity - inability: Not on file  . Transportation needs - medical: Not on file  . Transportation needs - non-medical: Not on file  Occupational History  . Not on file  Tobacco Use  . Smoking status: Former Research scientist (life sciences)  . Smokeless tobacco: Never Used  Substance and Sexual Activity  . Alcohol use: Yes    Alcohol/week: 0.0 oz    Comment: "once in a blue moon"   . Drug use: No  . Sexual activity: Yes  Other Topics Concern  . Not on file  Social History Narrative  . Not on file    Allergies  Allergen Reactions  . Cefaclor  Itching  . Penicillin V Potassium Rash    Outpatient Medications Prior to Visit  Medication Sig Dispense Refill  . amLODipine-benazepril (LOTREL) 10-20 MG capsule TAKE ONE (1) CAPSULE EACH DAY. 30 capsule 6  . aspirin EC 81 MG tablet Take 1 tablet by mouth daily at 6 (six) AM.    . atorvastatin (LIPITOR) 20 MG tablet Take 1 tablet (20 mg total) by mouth at bedtime. 30 tablet 6  . carvedilol (COREG) 6.25 MG tablet TAKE (1) TABLET BY MOUTH TWICE DAILY 60 tablet 6  . diclofenac (VOLTAREN) 75 MG EC tablet TAKE (1) TABLET BY MOUTH EVERY DAY 30 tablet 2  . diclofenac sodium (VOLTAREN) 1 % GEL Apply 2 g topically 2 (two) times daily. 100 g 0  . fenofibrate (TRICOR) 145 MG tablet TAKE ONE (1) TABLET BY MOUTH ONCE DAILY AT 6AM 30 tablet 6  . fluticasone (FLONASE) 50 MCG/ACT nasal spray Place 1 spray into both nostrils daily. 1 g 11  . Garlic 810 MG CAPS Take 1 capsule by mouth daily at 6 (six) AM.    . hydrochlorothiazide (MICROZIDE) 12.5 MG capsule TAKE (1) CAPSULE BY MOUTH EVERY DAY AT 6AM 30 capsule 6  . loratadine (CLARITIN) 10 MG tablet Take 1 tablet (10 mg total) by mouth daily. 90 tablet 3  . Multiple Vitamin (MULTI-VITAMINS) TABS Take  1 tablet by mouth daily at 6 (six) AM.    . omega-3 acid ethyl esters (LOVAZA) 1 g capsule TAKE TWO CAPSULES TWICE DAILY. 120 capsule 1  . sertraline (ZOLOFT) 50 MG tablet Take 1 tablet (50 mg total) by mouth daily at 6 (six) AM. 30 tablet 6  . traMADol (ULTRAM) 50 MG tablet Take 1 tablet (50 mg total) by mouth every 8 (eight) hours as needed. 42 tablet 0  . cyclobenzaprine (FLEXERIL) 10 MG tablet Take 1 tablet (10 mg total) by mouth 3 (three) times daily as needed for muscle spasms. (Patient not taking: Reported on 12/27/2017) 30 tablet 0  . meloxicam (MOBIC) 15 MG tablet Take 1 tablet (15 mg total) by mouth daily. (Patient not taking: Reported on 12/27/2017) 30 tablet 0  . sildenafil (VIAGRA) 100 MG tablet Take 1 tablet (100 mg total) by mouth daily as needed for  erectile dysfunction. (Patient not taking: Reported on 12/27/2017) 10 tablet 5  . traMADol (ULTRAM) 50 MG tablet Take 1 tablet (50 mg total) by mouth every 8 (eight) hours as needed. 30 tablet 0   No facility-administered medications prior to visit.     Review of Systems  Constitutional: Negative for chills, fever, malaise/fatigue and weight loss.  HENT: Negative for congestion, ear discharge, ear pain and sore throat.   Eyes: Negative for blurred vision.  Respiratory: Negative for cough, sputum production, shortness of breath and wheezing.   Cardiovascular: Negative for chest pain, palpitations and leg swelling.  Gastrointestinal: Negative for abdominal pain, blood in stool, constipation, diarrhea, heartburn, melena and nausea.  Genitourinary: Negative for dysuria, frequency, hematuria and urgency.  Musculoskeletal: Positive for joint pain and joint swelling. Negative for back pain, myalgias and neck pain.  Skin: Negative for rash.  Neurological: Negative for dizziness, tingling, sensory change, focal weakness and headaches.  Endo/Heme/Allergies: Negative for environmental allergies and polydipsia. Does not bruise/bleed easily.  Psychiatric/Behavioral: Negative for depression and suicidal ideas. The patient is not nervous/anxious and does not have insomnia.      Objective  Vitals:   12/27/17 1511  BP: 130/80  Pulse: 80  Weight: 230 lb (104.3 kg)  Height: 6' (1.829 m)    Physical Exam  Constitutional: He is oriented to person, place, and time and well-developed, well-nourished, and in no distress.  HENT:  Head: Normocephalic.  Right Ear: External ear normal.  Left Ear: External ear normal.  Nose: Nose normal.  Mouth/Throat: Oropharynx is clear and moist.  Eyes: Conjunctivae and EOM are normal. Pupils are equal, round, and reactive to light. Right eye exhibits no discharge. Left eye exhibits no discharge. No scleral icterus.  Neck: Normal range of motion. Neck supple. Normal  carotid pulses, no hepatojugular reflux and no JVD present. Carotid bruit is not present. No tracheal deviation present. No thyromegaly present.  Cardiovascular: Normal rate, regular rhythm, S1 normal, S2 normal, normal heart sounds, intact distal pulses and normal pulses. PMI is not displaced. Exam reveals no gallop, no S3, no S4 and no friction rub.  No murmur heard. Pulmonary/Chest: Breath sounds normal. No respiratory distress. He has no wheezes. He has no rales.  Abdominal: Soft. Bowel sounds are normal. He exhibits no mass. There is no hepatosplenomegaly. There is no tenderness. There is no rebound, no guarding and no CVA tenderness.  Musculoskeletal: Normal range of motion. He exhibits no edema or tenderness.  Lymphadenopathy:    He has no cervical adenopathy.  Neurological: He is alert and oriented to person, place, and time. He  has normal sensation, normal strength, normal reflexes and intact cranial nerves. No cranial nerve deficit.  Skin: Skin is warm. No rash noted.  Psychiatric: Mood and affect normal.  Nursing note and vitals reviewed.     Assessment & Plan  Problem List Items Addressed This Visit      Cardiovascular and Mediastinum   Essential hypertension     Other   Hyperlipidemia    Other Visit Diagnoses    Preop examination    -  Primary   no contraindications to anesthesia nor surgery.   Relevant Orders   EKG 12-Lead (Completed)   Albumin   Hemoglobin      No orders of the defined types were placed in this encounter.     Dr. [facility]  12/27/17

## 2017-12-28 ENCOUNTER — Ambulatory Visit: Payer: Medicare HMO | Admitting: Family Medicine

## 2017-12-28 HISTORY — PX: JOINT REPLACEMENT: SHX530

## 2017-12-28 LAB — HEMOGLOBIN: HEMOGLOBIN: 13.6 g/dL (ref 13.0–17.7)

## 2017-12-28 LAB — ALBUMIN: Albumin: 4.3 g/dL (ref 3.6–4.8)

## 2018-01-07 ENCOUNTER — Ambulatory Visit (INDEPENDENT_AMBULATORY_CARE_PROVIDER_SITE_OTHER): Payer: Medicare HMO

## 2018-01-07 VITALS — BP 110/70 | HR 60 | Temp 97.8°F | Resp 16 | Ht 72.0 in | Wt 234.0 lb

## 2018-01-07 DIAGNOSIS — Z23 Encounter for immunization: Secondary | ICD-10-CM

## 2018-01-07 DIAGNOSIS — Z0000 Encounter for general adult medical examination without abnormal findings: Secondary | ICD-10-CM | POA: Diagnosis not present

## 2018-01-07 NOTE — Progress Notes (Signed)
Subjective:   Gary Kramer is a 72 y.o. male who presents for Medicare Annual/Subsequent preventive examination.  Review of Systems:  N/A Cardiac Risk Factors include: advanced age (>26men, >81 women);dyslipidemia;hypertension;male gender;obesity (BMI >30kg/m2);sedentary lifestyle     Objective:    Vitals: BP 110/70 (BP Location: Left Arm, Patient Position: Sitting, Cuff Size: Large)   Pulse 60   Temp 97.8 F (36.6 C) (Oral)   Resp 16   Ht 6' (1.829 m)   Wt 234 lb (106.1 kg)   BMI 31.74 kg/m   Body mass index is 31.74 kg/m.  Advanced Directives 01/07/2018  Does Patient Have a Medical Advance Directive? No  Would patient like information on creating a medical advance directive? Yes (MAU/Ambulatory/Procedural Areas - Information given)    Tobacco Social History   Tobacco Use  Smoking Status Former Smoker  . Packs/day: 1.50  . Years: 30.00  . Pack years: 45.00  . Types: Cigarettes  . Last attempt to quit: 1996  . Years since quitting: 23.1  Smokeless Tobacco Never Used  Tobacco Comment   smoking cessatioinn materials not required     Counseling given: No Comment: smoking cessatioinn materials not required   Clinical Intake:  Pre-visit preparation completed: Yes  Pain : 0-10 Pain Score: 5  Pain Type: Chronic pain Pain Location: Hip Pain Orientation: Left, Right(Right hip and Left knee pain. Pt scheduled to undergo hip and knee surgery ) Pain Onset: More than a month ago Pain Frequency: Constant Pain Relieving Factors: Use of cane with ambulation and Tramadol Effect of Pain on Daily Activities: Unable to exercise and avoids stairs when possible. Walks with cane  Pain Relieving Factors: Use of cane with ambulation and Tramadol  BMI - recorded: 31.74 Nutritional Status: BMI > 30  Obese Nutritional Risks: None Diabetes: No  How often do you need to have someone help you when you read instructions, pamphlets, or other written materials from your  doctor or pharmacy?: 1 - Never  Interpreter Needed?: No  Information entered by :: AEversole, LPN  Past Medical History:  Diagnosis Date  . Allergy   . Depression   . Erectile dysfunction   . Hyperlipidemia   . Hypertension    Past Surgical History:  Procedure Laterality Date  . COLONOSCOPY  2016   cleared for 5 yrs- Dr Candace Cruise  . HERNIA REPAIR     Family History  Problem Relation Age of Onset  . Cancer Mother        brain  . Stroke Father    Social History   Socioeconomic History  . Marital status: Widowed    Spouse name: None  . Number of children: 2  . Years of education: None  . Highest education level: Bachelor's degree (e.g., BA, AB, BS)  Social Needs  . Financial resource strain: Not hard at all  . Food insecurity - worry: Never true  . Food insecurity - inability: Never true  . Transportation needs - medical: No  . Transportation needs - non-medical: No  Occupational History  . Occupation: Retired  Tobacco Use  . Smoking status: Former Smoker    Packs/day: 1.50    Years: 30.00    Pack years: 45.00    Types: Cigarettes    Last attempt to quit: 1996    Years since quitting: 23.1  . Smokeless tobacco: Never Used  . Tobacco comment: smoking cessatioinn materials not required  Substance and Sexual Activity  . Alcohol use: No    Alcohol/week: 0.0  oz    Frequency: Never  . Drug use: No  . Sexual activity: Not Currently  Other Topics Concern  . None  Social History Narrative  . None    Outpatient Encounter Medications as of 01/07/2018  Medication Sig  . amLODipine-benazepril (LOTREL) 10-20 MG capsule TAKE ONE (1) CAPSULE EACH DAY.  Marland Kitchen Ascorbic Acid (VITAMIN C) 100 MG tablet Take 100 mg by mouth daily.  Marland Kitchen aspirin EC 81 MG tablet Take 1 tablet by mouth daily at 6 (six) AM.  . atorvastatin (LIPITOR) 20 MG tablet Take 1 tablet (20 mg total) by mouth at bedtime.  . carvedilol (COREG) 6.25 MG tablet TAKE (1) TABLET BY MOUTH TWICE DAILY  . cholecalciferol  (VITAMIN D) 400 units TABS tablet Take 400 Units by mouth daily.  . diclofenac (VOLTAREN) 75 MG EC tablet TAKE (1) TABLET BY MOUTH EVERY DAY  . diclofenac sodium (VOLTAREN) 1 % GEL Apply 2 g topically 2 (two) times daily.  . fenofibrate (TRICOR) 145 MG tablet TAKE ONE (1) TABLET BY MOUTH ONCE DAILY AT 6AM  . fluticasone (FLONASE) 50 MCG/ACT nasal spray Place 1 spray into both nostrils daily.  . Garlic 284 MG CAPS Take 1 capsule by mouth daily at 6 (six) AM.  . hydrochlorothiazide (MICROZIDE) 12.5 MG capsule TAKE (1) CAPSULE BY MOUTH EVERY DAY AT 6AM  . loratadine (CLARITIN) 10 MG tablet Take 1 tablet (10 mg total) by mouth daily.  . Multiple Vitamin (MULTI-VITAMINS) TABS Take 1 tablet by mouth daily at 6 (six) AM.  . sertraline (ZOLOFT) 50 MG tablet Take 1 tablet (50 mg total) by mouth daily at 6 (six) AM.  . traMADol (ULTRAM) 50 MG tablet Take 1 tablet (50 mg total) by mouth every 8 (eight) hours as needed.  . cyclobenzaprine (FLEXERIL) 10 MG tablet Take 1 tablet (10 mg total) by mouth 3 (three) times daily as needed for muscle spasms. (Patient not taking: Reported on 12/27/2017)  . meloxicam (MOBIC) 15 MG tablet Take 1 tablet (15 mg total) by mouth daily. (Patient not taking: Reported on 01/07/2018)  . omega-3 acid ethyl esters (LOVAZA) 1 g capsule TAKE TWO CAPSULES TWICE DAILY. (Patient not taking: Reported on 01/07/2018)  . sildenafil (VIAGRA) 100 MG tablet Take 1 tablet (100 mg total) by mouth daily as needed for erectile dysfunction. (Patient not taking: Reported on 12/27/2017)   No facility-administered encounter medications on file as of 01/07/2018.     Activities of Daily Living In your present state of health, do you have any difficulty performing the following activities: 01/07/2018  Hearing? N  Comment denies wearing hearing aids  Vision? N  Comment denies wearing eyeglasses  Difficulty concentrating or making decisions? N  Walking or climbing stairs? Y  Comment Left knee and Right  hip pain. Scheduled for surgery  Dressing or bathing? N  Doing errands, shopping? N  Preparing Food and eating ? N  Comment denies dentures  Using the Toilet? N  In the past six months, have you accidently leaked urine? N  Do you have problems with loss of bowel control? N  Managing your Medications? N  Managing your Finances? N  Housekeeping or managing your Housekeeping? N  Some recent data might be hidden    Patient Care Team: Juline Patch, MD as PCP - General (Family Medicine) Reche Dixon, PA-C as Consulting Physician (Orthopedic Surgery) Leanor Kail, MD as Consulting Physician (Orthopedic Surgery)   Assessment:   This is a routine wellness examination for [place].  Exercise  Activities and Dietary recommendations Current Exercise Habits: The patient does not participate in regular exercise at present(Left knee and Right hip pain. Scheduled for surgery), Exercise limited by: orthopedic condition(s)(Left knee and Right hip pain. Scheduled for surgery)  Goals    . DIET - INCREASE WATER INTAKE     Recommend to drink at least 6-8 8oz glasses of water per day.       Fall Risk Fall Risk  01/07/2018 01/05/2017 07/21/2016 07/20/2015  Falls in the past year? No No No No  Risk for fall due to : Impaired balance/gait;Impaired mobility;Medication side effect - - -  Risk for fall due to: Comment Left knee and Right hip pain. Scheduled for surgery - - -   Is the patient's home free of loose throw rugs in walkways, pet beds, electrical cords, etc?   Yes Does the patient have any grab bars in the bathroom? No  Does the patient use a shower chair when bathing? Yes Does the patient have any stairs in or around the home? Yes If so, are there any handrails?  Yes Does the patient have adequate lighting?  Yes Does the patient use a cane, walker or w/c? Yes, cane for all ambulation Does the patient use of an elevated toilet seat? No  Timed Get Up and Go Performed: Yes. Pt ambulated 10  feet within 27 sec. Gait slow, steady, guarded and with the use of an assistive device. Left knee and Right hip pain contributing to issues with ambulation. Scheduled for surgery. No intervention required at this time. Fall risk prevention has been discussed.  Pt declined my offer to send Community Resource Referral to Care Guide for installation of grab bars in the shower and for an elevated toilet seat.  Depression Screen PHQ 2/9 Scores 01/07/2018 07/10/2017 01/05/2017 07/21/2016  PHQ - 2 Score 0 0 0 0  PHQ- 9 Score 0 0 - -    Cognitive Function     6CIT Screen 01/07/2018 01/05/2017  What Year? 0 points 0 points  What month? 0 points 0 points  What time? 0 points 0 points  Count back from 20 0 points 0 points  Months in reverse 0 points 0 points  Repeat phrase 0 points 0 points  Total Score 0 0    Immunization History  Administered Date(s) Administered  . Influenza, High Dose Seasonal PF 08/17/2017  . Influenza,inj,Quad PF,6+ Mos 09/13/2015, 08/28/2016  . Pneumococcal Conjugate-13 09/18/2014  . Pneumococcal Polysaccharide-23 12/28/2012, 01/07/2018  . Tdap 01/05/2017    Qualifies for Shingles Vaccine? States he completed the Shingrix series. Pt has been advised to provide a copy of his vaccination record.  Screening Tests Health Maintenance  Topic Date Due  . Hepatitis C Screening  01/07/2019 (Originally 11/07/1946)  . COLONOSCOPY  12/28/2024  . TETANUS/TDAP  01/05/2027  . INFLUENZA VACCINE  Completed  . PNA vac Low Risk Adult  Completed   Cancer Screenings: Lung: Low Dose CT Chest recommended if Age 83-80 years, 30 pack-year currently smoking OR have quit w/in 15years. Patient does qualify. An Epic message has been sent to Burgess Estelle, RN (Oncology Nurse Navigator) regarding the possible need for this exam. Raquel Sarna will review the patient's chart to determine if the patient truly qualifies for the exam. If the patient qualifies, Raquel Sarna will order the Low Dose CT of the chest to  facilitate the scheduling of this exam. Colorectal: Completed colonoscopy 03/11/15. Repeat every 10 years.  Additional Screenings: Hepatitis B/HIV/Syphillis: Does not qualify Hepatitis C Screening:  Declined    Plan:  I have personally reviewed and addressed the Medicare Annual Wellness questionnaire and have noted the following in the patient's chart:  A. Medical and social history B. Use of alcohol, tobacco or illicit drugs  C. Current medications and supplements D. Functional ability and status E.  Nutritional status F.  Physical activity G. Advance directives H. List of other physicians I.  Hospitalizations, surgeries, and ER visits in previous 12 months J.  [place] such as hearing and vision if needed, cognitive and depression L. Referrals and appointments - none  In addition, I have reviewed and discussed with patient certain preventive protocols, quality metrics, and best practice recommendations. A written personalized care plan for preventive services as well as general preventive health recommendations were provided to patient.  Signed,  Aleatha Borer, LPN Nurse Health Advisor  MD Recommendations: States he completed the Shingrix series. Pt has been advised to provide a copy of his vaccination record.  Lung Cancer Screening: Patient does qualify. An Epic message has been sent to Burgess Estelle, RN (Oncology Nurse Navigator) regarding the possible need for this exam. Raquel Sarna will review the patient's chart to determine if the patient truly qualifies for the exam. If the patient qualifies, Raquel Sarna will order the Low Dose CT of the chest to facilitate the scheduling of this exam.  Due for Hep C screening but declined today.

## 2018-01-07 NOTE — Patient Instructions (Addendum)
Gary Kramer , Thank you for taking time to come for your Medicare Wellness Visit. I appreciate your ongoing commitment to your health goals. Please review the following plan we discussed and let me know if I can assist you in the future.   Screening recommendations/referrals: Colorectal Screening: Completed colonoscopy 03/11/15. Repeat every 10 years.  Vision/Dental Exams: Recommended yearly ophthalmology/optometry visit for glaucoma screening and checkup Recommended yearly dental visit for hygiene and checkup  Vaccinations: Influenza vaccine: Up to date Pneumococcal vaccine: Completed series today. Tdap vaccine: Up to date Shingles vaccine: Please provide a copy of your vaccination record.    Advanced directives: Advance directive discussed with you today. I have provided a copy for you to complete at home and have notarized. Once this is complete please bring a copy in to our office so we can scan it into your chart.  Conditions/risks identified: Recommend to drink at least 6-8 8oz glasses of water per day.  Next appointment: You are scheduled to see Dr. Ronnald Ramp on 01/16/18 @ 2:30am.   Please schedule your Annual Wellness Visit with your Nurse Health Advisor in one year.  Preventive Care 43 Years and Older, Male Preventive care refers to lifestyle choices and visits with your health care provider that can promote health and wellness. What does preventive care include?  A yearly physical exam. This is also called an annual well check.  Dental exams once or twice a year.  Routine eye exams. Ask your health care provider how often you should have your eyes checked.  Personal lifestyle choices, including:  Daily care of your teeth and gums.  Regular physical activity.  Eating a healthy diet.  Avoiding tobacco and drug use.  Limiting alcohol use.  Practicing safe sex.  Taking low doses of aspirin every day.  Taking vitamin and mineral supplements as recommended by your health  care provider. What happens during an annual well check? The services and screenings done by your health care provider during your annual well check will depend on your age, overall health, lifestyle risk factors, and family history of disease. Counseling  Your health care provider may ask you questions about your:  Alcohol use.  Tobacco use.  Drug use.  Emotional well-being.  Home and relationship well-being.  Sexual activity.  Eating habits.  History of falls.  Memory and ability to understand (cognition).  Work and work Statistician. Screening  You may have the following tests or measurements:  Height, weight, and BMI.  Blood pressure.  Lipid and cholesterol levels. These may be checked every 5 years, or more frequently if you are over 46 years old.  Skin check.  Lung cancer screening. You may have this screening every year starting at age 13 if you have a 30-pack-year history of smoking and currently smoke or have quit within the past 15 years.  Fecal occult blood test (FOBT) of the stool. You may have this test every year starting at age 22.  Flexible sigmoidoscopy or colonoscopy. You may have a sigmoidoscopy every 5 years or a colonoscopy every 10 years starting at age 75.  Prostate cancer screening. Recommendations will vary depending on your family history and other risks.  Hepatitis C blood test.  Hepatitis B blood test.  Sexually transmitted disease (STD) testing.  Diabetes screening. This is done by checking your blood sugar (glucose) after you have not eaten for a while (fasting). You may have this done every 1-3 years.  Abdominal aortic aneurysm (AAA) screening. You may need this if  you are a current or former smoker.  Osteoporosis. You may be screened starting at age 70 if you are at high risk. Talk with your health care provider about your test results, treatment options, and if necessary, the need for more tests. Vaccines  Your health care  provider may recommend certain vaccines, such as:  Influenza vaccine. This is recommended every year.  Tetanus, diphtheria, and acellular pertussis (Tdap, Td) vaccine. You may need a Td booster every 10 years.  Zoster vaccine. You may need this after age 21.  Pneumococcal 13-valent conjugate (PCV13) vaccine. One dose is recommended after age 49.  Pneumococcal polysaccharide (PPSV23) vaccine. One dose is recommended after age 90. Talk to your health care provider about which screenings and vaccines you need and how often you need them. This information is not intended to replace advice given to you by your health care provider. Make sure you discuss any questions you have with your health care provider. Document Released: 12/10/2015 Document Revised: 08/02/2016 Document Reviewed: 09/14/2015 Elsevier Interactive Patient Education  2017 [place] Prevention in the Home Falls can cause injuries. They can happen to people of all ages. There are many things you can do to make your home safe and to help prevent falls. What can I do on the outside of my home?  Regularly fix the edges of walkways and driveways and fix any cracks.  Remove anything that might make you trip as you walk through a door, such as a raised step or threshold.  Trim any bushes or trees on the path to your home.  Use bright outdoor lighting.  Clear any walking paths of anything that might make someone trip, such as rocks or tools.  Regularly check to see if handrails are loose or broken. Make sure that both sides of any steps have handrails.  Any raised decks and porches should have guardrails on the edges.  Have any leaves, snow, or ice cleared regularly.  Use sand or salt on walking paths during winter.  Clean up any spills in your garage right away. This includes oil or grease spills. What can I do in the bathroom?  Use night lights.  Install grab bars by the toilet and in the tub and shower. Do  not use towel bars as grab bars.  Use non-skid mats or decals in the tub or shower.  If you need to sit down in the shower, use a plastic, non-slip stool.  Keep the floor dry. Clean up any water that spills on the floor as soon as it happens.  Remove soap buildup in the tub or shower regularly.  Attach bath mats securely with double-sided non-slip rug tape.  Do not have throw rugs and other things on the floor that can make you trip. What can I do in the bedroom?  Use night lights.  Make sure that you have a light by your bed that is easy to reach.  Do not use any sheets or blankets that are too big for your bed. They should not hang down onto the floor.  Have a firm chair that has side arms. You can use this for support while you get dressed.  Do not have throw rugs and other things on the floor that can make you trip. What can I do in the kitchen?  Clean up any spills right away.  Avoid walking on wet floors.  Keep items that you use a lot in easy-to-reach places.  If you need to  reach something above you, use a strong step stool that has a grab bar.  Keep electrical cords out of the way.  Do not use floor polish or wax that makes floors slippery. If you must use wax, use non-skid floor wax.  Do not have throw rugs and other things on the floor that can make you trip. What can I do with my stairs?  Do not leave any items on the stairs.  Make sure that there are handrails on both sides of the stairs and use them. Fix handrails that are broken or loose. Make sure that handrails are as long as the stairways.  Check any carpeting to make sure that it is firmly attached to the stairs. Fix any carpet that is loose or worn.  Avoid having throw rugs at the top or bottom of the stairs. If you do have throw rugs, attach them to the floor with carpet tape.  Make sure that you have a light switch at the top of the stairs and the bottom of the stairs. If you do not have them,  ask someone to add them for you. What else can I do to help prevent falls?  Wear shoes that:  Do not have high heels.  Have rubber bottoms.  Are comfortable and fit you well.  Are closed at the toe. Do not wear sandals.  If you use a stepladder:  Make sure that it is fully opened. Do not climb a closed stepladder.  Make sure that both sides of the stepladder are locked into place.  Ask someone to hold it for you, if possible.  Clearly mark and make sure that you can see:  Any grab bars or handrails.  First and last steps.  Where the edge of each step is.  Use tools that help you move around (mobility aids) if they are needed. These include:  Canes.  Walkers.  Scooters.  Crutches.  Turn on the lights when you go into a dark area. Replace any light bulbs as soon as they burn out.  Set up your furniture so you have a clear path. Avoid moving your furniture around.  If any of your floors are uneven, fix them.  If there are any pets around you, be aware of where they are.  Review your medicines with your doctor. Some medicines can make you feel dizzy. This can increase your chance of falling. Ask your doctor what other things that you can do to help prevent falls. This information is not intended to replace advice given to you by your health care provider. Make sure you discuss any questions you have with your health care provider. Document Released: 09/09/2009 Document Revised: 04/20/2016 Document Reviewed: 12/18/2014 Elsevier Interactive Patient Education  2017 [place] provider would like to you have your annual eye exam. Please contact your current eye doctor to schedule your annual eye exam to evaluate the health of your eyes. If you have not yet established care with a physician to evaluate the health of your eyes, below is a list of physicians for you to contact and to establish care.   [facility] Address: [address] [place], [place] [address]   Address: [address] [place], [place] [address]  Phone: [phone]      Phone: [phone]  Website: [email or web address]    Website: [email or web address]  [employer]  Address: [place], [place], [place] [address]   Address: [place], [place], [place] [address] Phone: [phone]      Phone: [phone]    [facility] Address: [place], [place], [place] [address]  Phone: [phone]

## 2018-01-08 ENCOUNTER — Telehealth: Payer: Self-pay | Admitting: *Deleted

## 2018-01-08 DIAGNOSIS — M1732 Unilateral post-traumatic osteoarthritis, left knee: Secondary | ICD-10-CM | POA: Diagnosis not present

## 2018-01-08 DIAGNOSIS — Z0183 Encounter for blood typing: Secondary | ICD-10-CM | POA: Diagnosis not present

## 2018-01-08 DIAGNOSIS — Z01812 Encounter for preprocedural laboratory examination: Secondary | ICD-10-CM | POA: Diagnosis not present

## 2018-01-08 DIAGNOSIS — Z01818 Encounter for other preprocedural examination: Secondary | ICD-10-CM | POA: Diagnosis not present

## 2018-01-08 NOTE — Telephone Encounter (Signed)
Received referral for low dose lung cancer screening CT scan. Message left at phone number listed in EMR for patient to call me back to facilitate scheduling scan.  

## 2018-01-08 NOTE — Telephone Encounter (Signed)
Contacted patient regarding lung cancer screening referral. Patient reports quit date 22 years ago and understands that he does not meet the eligibility requirements for lung cancer screening.

## 2018-01-16 ENCOUNTER — Ambulatory Visit (INDEPENDENT_AMBULATORY_CARE_PROVIDER_SITE_OTHER): Payer: Medicare HMO | Admitting: Family Medicine

## 2018-01-16 ENCOUNTER — Encounter: Payer: Self-pay | Admitting: Family Medicine

## 2018-01-16 VITALS — BP 112/60 | HR 70 | Resp 12 | Ht 72.0 in | Wt 229.0 lb

## 2018-01-16 DIAGNOSIS — R69 Illness, unspecified: Secondary | ICD-10-CM | POA: Diagnosis not present

## 2018-01-16 DIAGNOSIS — F3341 Major depressive disorder, recurrent, in partial remission: Secondary | ICD-10-CM

## 2018-01-16 DIAGNOSIS — Z1211 Encounter for screening for malignant neoplasm of colon: Secondary | ICD-10-CM | POA: Diagnosis not present

## 2018-01-16 DIAGNOSIS — Z0000 Encounter for general adult medical examination without abnormal findings: Secondary | ICD-10-CM

## 2018-01-16 DIAGNOSIS — E669 Obesity, unspecified: Secondary | ICD-10-CM | POA: Diagnosis not present

## 2018-01-16 DIAGNOSIS — M1611 Unilateral primary osteoarthritis, right hip: Secondary | ICD-10-CM | POA: Diagnosis not present

## 2018-01-16 LAB — HEMOCCULT GUIAC POC 1CARD (OFFICE): Fecal Occult Blood, POC: NEGATIVE

## 2018-01-16 MED ORDER — HYDROCODONE-ACETAMINOPHEN 5-325 MG PO TABS
1.0000 | ORAL_TABLET | Freq: Four times a day (QID) | ORAL | 0 refills | Status: DC | PRN
Start: 2018-01-16 — End: 2018-04-04

## 2018-01-16 NOTE — Progress Notes (Signed)
Name: Alfonza Toft   MRN: [id number]    DOB: [birth year]   Date:01/16/2018       Progress Note  Subjective  Chief Complaint  No chief complaint on file.   Patient presents for annual physical exam.  Hip Pain   There was no injury mechanism. The pain is present in the right thigh and right hip. The quality of the pain is described as aching. The pain is at a severity of 8/10. The pain is severe. The pain has been constant since onset. Pertinent negatives include no inability to bear weight, loss of motion, muscle weakness or tingling. The symptoms are aggravated by movement.    No problem-specific Assessment & Plan notes found for this encounter.   Past Medical History:  Diagnosis Date  . Allergy   . Depression   . Erectile dysfunction   . Hyperlipidemia   . Hypertension     Past Surgical History:  Procedure Laterality Date  . COLONOSCOPY  2016   cleared for 5 yrs- Dr Candace Cruise  . HERNIA REPAIR      Family History  Problem Relation Age of Onset  . Cancer Mother        brain  . Stroke Father     Social History   Socioeconomic History  . Marital status: Widowed    Spouse name: Not on file  . Number of children: 2  . Years of education: Not on file  . Highest education level: Bachelor's degree (e.g., BA, AB, BS)  Social Needs  . Financial resource strain: Not hard at all  . Food insecurity - worry: Never true  . Food insecurity - inability: Never true  . Transportation needs - medical: No  . Transportation needs - non-medical: No  Occupational History  . Occupation: Retired  Tobacco Use  . Smoking status: Former Smoker    Packs/day: 1.50    Years: 30.00    Pack years: 45.00    Types: Cigarettes    Last attempt to quit: 1996    Years since quitting: 23.1  . Smokeless tobacco: Never Used  . Tobacco comment: smoking cessatioinn materials not required  Substance and Sexual Activity  . Alcohol use: No    Alcohol/week: 0.0 oz    Frequency: Never  . Drug use:  No  . Sexual activity: Not Currently  Other Topics Concern  . Not on file  Social History Narrative  . Not on file    Allergies  Allergen Reactions  . Cefaclor Itching  . Penicillin V Potassium Rash    Outpatient Medications Prior to Visit  Medication Sig Dispense Refill  . amLODipine-benazepril (LOTREL) 10-20 MG capsule TAKE ONE (1) CAPSULE EACH DAY. 30 capsule 6  . Ascorbic Acid (VITAMIN C) 100 MG tablet Take 100 mg by mouth daily.    Marland Kitchen atorvastatin (LIPITOR) 20 MG tablet Take 1 tablet (20 mg total) by mouth at bedtime. 30 tablet 6  . carvedilol (COREG) 6.25 MG tablet TAKE (1) TABLET BY MOUTH TWICE DAILY 60 tablet 6  . cholecalciferol (VITAMIN D) 400 units TABS tablet Take 400 Units by mouth daily.    . cyclobenzaprine (FLEXERIL) 10 MG tablet Take 1 tablet (10 mg total) by mouth 3 (three) times daily as needed for muscle spasms. 30 tablet 0  . fenofibrate (TRICOR) 145 MG tablet TAKE ONE (1) TABLET BY MOUTH ONCE DAILY AT 6AM 30 tablet 6  . fluticasone (FLONASE) 50 MCG/ACT nasal spray Place 1 spray into both nostrils daily. 1 g  11  . Garlic 361 MG CAPS Take 1 capsule by mouth daily at 6 (six) AM.    . hydrochlorothiazide (MICROZIDE) 12.5 MG capsule TAKE (1) CAPSULE BY MOUTH EVERY DAY AT 6AM 30 capsule 6  . loratadine (CLARITIN) 10 MG tablet Take 1 tablet (10 mg total) by mouth daily. 90 tablet 3  . meloxicam (MOBIC) 15 MG tablet Take 1 tablet (15 mg total) by mouth daily. 30 tablet 0  . Multiple Vitamin (MULTI-VITAMINS) TABS Take 1 tablet by mouth daily at 6 (six) AM.    . sertraline (ZOLOFT) 50 MG tablet Take 1 tablet (50 mg total) by mouth daily at 6 (six) AM. 30 tablet 6  . sildenafil (VIAGRA) 100 MG tablet Take 1 tablet (100 mg total) by mouth daily as needed for erectile dysfunction. 10 tablet 5  . traMADol (ULTRAM) 50 MG tablet Take 1 tablet (50 mg total) by mouth every 8 (eight) hours as needed. 42 tablet 0  . aspirin EC 81 MG tablet Take 1 tablet by mouth daily at 6 (six) AM.     . diclofenac (VOLTAREN) 75 MG EC tablet TAKE (1) TABLET BY MOUTH EVERY DAY (Patient not taking: Reported on 01/16/2018) 30 tablet 2  . diclofenac sodium (VOLTAREN) 1 % GEL Apply 2 g topically 2 (two) times daily. (Patient not taking: Reported on 01/16/2018) 100 g 0  . omega-3 acid ethyl esters (LOVAZA) 1 g capsule TAKE TWO CAPSULES TWICE DAILY. (Patient not taking: Reported on 01/07/2018) 120 capsule 1   No facility-administered medications prior to visit.     Review of Systems  Constitutional: Negative for chills, fever, malaise/fatigue and weight loss.  HENT: Negative for ear discharge, ear pain and sore throat.   Eyes: Negative for blurred vision.  Respiratory: Negative for cough, sputum production, shortness of breath and wheezing.   Cardiovascular: Negative for chest pain, palpitations and leg swelling.  Gastrointestinal: Negative for abdominal pain, blood in stool, constipation, diarrhea, heartburn, melena and nausea.  Genitourinary: Negative for dysuria, frequency, hematuria and urgency.  Musculoskeletal: Negative for back pain, joint pain, myalgias and neck pain.  Skin: Negative for rash.  Neurological: Negative for dizziness, tingling, sensory change, focal weakness and headaches.  Endo/Heme/Allergies: Negative for environmental allergies and polydipsia. Does not bruise/bleed easily.  Psychiatric/Behavioral: Negative for depression and suicidal ideas. The patient is not nervous/anxious and does not have insomnia.      Objective  Vitals:   01/16/18 0939  BP: 112/60  Pulse: 70  Resp: 12  SpO2: 96%  Weight: 229 lb (103.9 kg)  Height: 6' (1.829 m)    Physical Exam  Constitutional: He is oriented to person, place, and time and well-developed, well-nourished, and in no distress. Vital signs are normal.  HENT:  Head: Normocephalic and atraumatic.  Right Ear: Tympanic membrane, external ear and ear canal normal. No decreased hearing is noted.  Left Ear: Tympanic membrane,  external ear and ear canal normal. No decreased hearing is noted.  Nose: Nose normal. No mucosal edema or rhinorrhea.  Mouth/Throat: Uvula is midline, oropharynx is clear and moist and mucous membranes are normal.  Eyes: Conjunctivae, EOM and lids are normal. Pupils are equal, round, and reactive to light. Lids are everted and swept, no foreign bodies found. Right eye exhibits no discharge. Left eye exhibits no discharge. No scleral icterus.  Neck: Trachea normal and full passive range of motion without pain. Neck supple. Normal carotid pulses, no hepatojugular reflux and no JVD present. No spinous process tenderness and no  muscular tenderness present. Carotid bruit is not present. No neck rigidity. No tracheal deviation, no edema, no erythema and normal range of motion present. No thyromegaly present.  Cardiovascular: Normal rate, regular rhythm, S1 normal, normal heart sounds, intact distal pulses and normal pulses. PMI is not displaced. Exam reveals no gallop, no S3, no S4 and no friction rub.  No murmur heard. Pulmonary/Chest: Effort normal and breath sounds normal. No respiratory distress. He has no wheezes. He has no rales.  Abdominal: Soft. Normal aorta and bowel sounds are normal. He exhibits no mass. There is no hepatosplenomegaly. There is no tenderness. There is no rebound, no guarding and no CVA tenderness.  Genitourinary: Rectum normal, prostate normal, testes/scrotum normal and penis normal. Penis exhibits no lesions and no edema.  Musculoskeletal: He exhibits no edema.       Right hip: He exhibits decreased range of motion and tenderness.  Lymphadenopathy:       Head (right side): No submental and no submandibular adenopathy present.       Head (left side): No submental and no submandibular adenopathy present.    He has no cervical adenopathy.    He has no axillary adenopathy.  Neurological: He is alert and oriented to person, place, and time. He has normal motor skills, normal  sensation, normal strength, normal reflexes and intact cranial nerves. No cranial nerve deficit.  Skin: Skin is warm and intact. No rash noted.  Psychiatric: Mood and affect normal.  Nursing note and vitals reviewed.     Assessment & Plan  Problem List Items Addressed This Visit      Other   Recurrent major depressive disorder, in partial remission ([place])    Other Visit Diagnoses    Annual physical exam    -  Primary   Primary osteoarthritis of right hip       Relevant Medications   HYDROcodone-acetaminophen (NORCO/VICODIN) 5-325 MG tablet   Colon cancer screening       Relevant Orders   POCT occult blood stool (Completed)   Obesity (BMI 30.0-34.9)          Meds ordered this encounter  Medications  . HYDROcodone-acetaminophen (NORCO/VICODIN) 5-325 MG tablet    Sig: Take 1 tablet by mouth every 6 (six) hours as needed for moderate pain.    Dispense:  20 tablet    Refill:  0  Jarad Barth is a 72 y.o. male who presents today for his Complete Annual Exam. He feels well. He reports exercising rarely. He reports he is sleeping well.  Immunizations are reviewed and recommendations provided.   Age appropriate screening tests are discussed. Counseling given for risk factor reduction interventions. Health risks of being over weight were discussed and patient was counseled on weight loss options and exercise.  Dr. [facility]  01/16/18

## 2018-01-16 NOTE — Patient Instructions (Signed)

## 2018-01-21 DIAGNOSIS — Z87891 Personal history of nicotine dependence: Secondary | ICD-10-CM | POA: Diagnosis not present

## 2018-01-21 DIAGNOSIS — G8918 Other acute postprocedural pain: Secondary | ICD-10-CM | POA: Diagnosis not present

## 2018-01-21 DIAGNOSIS — R319 Hematuria, unspecified: Secondary | ICD-10-CM | POA: Diagnosis not present

## 2018-01-21 DIAGNOSIS — I10 Essential (primary) hypertension: Secondary | ICD-10-CM | POA: Diagnosis not present

## 2018-01-21 DIAGNOSIS — M1711 Unilateral primary osteoarthritis, right knee: Secondary | ICD-10-CM | POA: Diagnosis not present

## 2018-01-21 DIAGNOSIS — M25562 Pain in left knee: Secondary | ICD-10-CM | POA: Diagnosis not present

## 2018-01-21 DIAGNOSIS — N179 Acute kidney failure, unspecified: Secondary | ICD-10-CM | POA: Diagnosis not present

## 2018-01-21 DIAGNOSIS — M1712 Unilateral primary osteoarthritis, left knee: Secondary | ICD-10-CM | POA: Diagnosis not present

## 2018-01-22 DIAGNOSIS — M1712 Unilateral primary osteoarthritis, left knee: Secondary | ICD-10-CM | POA: Diagnosis not present

## 2018-01-24 DIAGNOSIS — Z96652 Presence of left artificial knee joint: Secondary | ICD-10-CM | POA: Diagnosis not present

## 2018-01-25 DIAGNOSIS — I10 Essential (primary) hypertension: Secondary | ICD-10-CM | POA: Diagnosis not present

## 2018-01-25 DIAGNOSIS — Z471 Aftercare following joint replacement surgery: Secondary | ICD-10-CM | POA: Diagnosis not present

## 2018-01-25 DIAGNOSIS — Z96652 Presence of left artificial knee joint: Secondary | ICD-10-CM | POA: Diagnosis not present

## 2018-01-25 DIAGNOSIS — M1611 Unilateral primary osteoarthritis, right hip: Secondary | ICD-10-CM | POA: Diagnosis not present

## 2018-01-25 DIAGNOSIS — Z7901 Long term (current) use of anticoagulants: Secondary | ICD-10-CM | POA: Diagnosis not present

## 2018-01-28 DIAGNOSIS — M1611 Unilateral primary osteoarthritis, right hip: Secondary | ICD-10-CM | POA: Diagnosis not present

## 2018-01-28 DIAGNOSIS — Z471 Aftercare following joint replacement surgery: Secondary | ICD-10-CM | POA: Diagnosis not present

## 2018-01-28 DIAGNOSIS — I10 Essential (primary) hypertension: Secondary | ICD-10-CM | POA: Diagnosis not present

## 2018-01-28 DIAGNOSIS — Z7901 Long term (current) use of anticoagulants: Secondary | ICD-10-CM | POA: Diagnosis not present

## 2018-01-28 DIAGNOSIS — Z96652 Presence of left artificial knee joint: Secondary | ICD-10-CM | POA: Diagnosis not present

## 2018-01-30 DIAGNOSIS — M1611 Unilateral primary osteoarthritis, right hip: Secondary | ICD-10-CM | POA: Diagnosis not present

## 2018-01-30 DIAGNOSIS — Z96652 Presence of left artificial knee joint: Secondary | ICD-10-CM | POA: Diagnosis not present

## 2018-01-30 DIAGNOSIS — I10 Essential (primary) hypertension: Secondary | ICD-10-CM | POA: Diagnosis not present

## 2018-01-30 DIAGNOSIS — Z471 Aftercare following joint replacement surgery: Secondary | ICD-10-CM | POA: Diagnosis not present

## 2018-01-30 DIAGNOSIS — Z7901 Long term (current) use of anticoagulants: Secondary | ICD-10-CM | POA: Diagnosis not present

## 2018-02-01 DIAGNOSIS — M1611 Unilateral primary osteoarthritis, right hip: Secondary | ICD-10-CM | POA: Diagnosis not present

## 2018-02-01 DIAGNOSIS — Z96652 Presence of left artificial knee joint: Secondary | ICD-10-CM | POA: Diagnosis not present

## 2018-02-01 DIAGNOSIS — Z7901 Long term (current) use of anticoagulants: Secondary | ICD-10-CM | POA: Diagnosis not present

## 2018-02-01 DIAGNOSIS — Z471 Aftercare following joint replacement surgery: Secondary | ICD-10-CM | POA: Diagnosis not present

## 2018-02-01 DIAGNOSIS — I10 Essential (primary) hypertension: Secondary | ICD-10-CM | POA: Diagnosis not present

## 2018-02-06 DIAGNOSIS — M1611 Unilateral primary osteoarthritis, right hip: Secondary | ICD-10-CM | POA: Diagnosis not present

## 2018-02-06 DIAGNOSIS — Z7901 Long term (current) use of anticoagulants: Secondary | ICD-10-CM | POA: Diagnosis not present

## 2018-02-06 DIAGNOSIS — Z471 Aftercare following joint replacement surgery: Secondary | ICD-10-CM | POA: Diagnosis not present

## 2018-02-06 DIAGNOSIS — I10 Essential (primary) hypertension: Secondary | ICD-10-CM | POA: Diagnosis not present

## 2018-02-06 DIAGNOSIS — Z96652 Presence of left artificial knee joint: Secondary | ICD-10-CM | POA: Diagnosis not present

## 2018-02-21 ENCOUNTER — Other Ambulatory Visit: Payer: Self-pay | Admitting: Family Medicine

## 2018-02-21 DIAGNOSIS — E785 Hyperlipidemia, unspecified: Secondary | ICD-10-CM

## 2018-02-21 DIAGNOSIS — I10 Essential (primary) hypertension: Secondary | ICD-10-CM

## 2018-02-27 DIAGNOSIS — M1611 Unilateral primary osteoarthritis, right hip: Secondary | ICD-10-CM | POA: Diagnosis not present

## 2018-03-04 ENCOUNTER — Other Ambulatory Visit: Payer: Self-pay | Admitting: Family Medicine

## 2018-03-04 DIAGNOSIS — M19041 Primary osteoarthritis, right hand: Secondary | ICD-10-CM

## 2018-03-04 DIAGNOSIS — M19042 Primary osteoarthritis, left hand: Principal | ICD-10-CM

## 2018-03-20 ENCOUNTER — Ambulatory Visit
Admission: RE | Admit: 2018-03-20 | Discharge: 2018-03-20 | Disposition: A | Discharge status: Home / Self Care | Payer: Medicare HMO | Source: Ambulatory Visit | Attending: Surgery | Admitting: Surgery

## 2018-03-20 ENCOUNTER — Encounter
Admission: RE | Admit: 2018-03-20 | Discharge: 2018-03-20 | Disposition: A | Discharge status: Home / Self Care | Payer: Medicare HMO | Source: Ambulatory Visit | Attending: Surgery | Admitting: Surgery

## 2018-03-20 ENCOUNTER — Other Ambulatory Visit: Payer: Self-pay

## 2018-03-20 DIAGNOSIS — R918 Other nonspecific abnormal finding of lung field: Secondary | ICD-10-CM | POA: Insufficient documentation

## 2018-03-20 DIAGNOSIS — Z01818 Encounter for other preprocedural examination: Secondary | ICD-10-CM | POA: Insufficient documentation

## 2018-03-20 DIAGNOSIS — Z01810 Encounter for preprocedural cardiovascular examination: Secondary | ICD-10-CM | POA: Diagnosis present

## 2018-03-20 DIAGNOSIS — J9811 Atelectasis: Secondary | ICD-10-CM | POA: Diagnosis not present

## 2018-03-20 HISTORY — DX: Unspecified osteoarthritis, unspecified site: M19.90

## 2018-03-20 LAB — BASIC METABOLIC PANEL
Anion gap: 7 (ref 5–15)
BUN: 24 mg/dL — AB (ref 6–20)
CHLORIDE: 103 mmol/L (ref 101–111)
CO2: 28 mmol/L (ref 22–32)
Calcium: 9.8 mg/dL (ref 8.9–10.3)
Creatinine, Ser: 1.38 mg/dL — ABNORMAL HIGH (ref 0.61–1.24)
GFR calc Af Amer: 58 mL/min — ABNORMAL LOW (ref >60)
GFR calc non Af Amer: 50 mL/min — ABNORMAL LOW (ref >60)
GLUCOSE: 105 mg/dL — AB (ref 65–99)
POTASSIUM: 3.9 mmol/L (ref 3.5–5.1)
Sodium: 138 mmol/L (ref 135–145)

## 2018-03-20 LAB — URINALYSIS, ROUTINE W REFLEX MICROSCOPIC
Bilirubin Urine: NEGATIVE
Glucose, UA: NEGATIVE mg/dL
Hgb urine dipstick: NEGATIVE
Ketones, ur: 5 mg/dL — AB
LEUKOCYTES UA: NEGATIVE
NITRITE: NEGATIVE
PH: 5 (ref 5.0–8.0)
Protein, ur: NEGATIVE mg/dL
SPECIFIC GRAVITY, URINE: 1.023 (ref 1.005–1.030)

## 2018-03-20 LAB — CBC
HCT: 40.6 % (ref 40.0–52.0)
HEMOGLOBIN: 13.8 g/dL (ref 13.0–18.0)
MCH: 29.6 pg (ref 26.0–34.0)
MCHC: 34.1 g/dL (ref 32.0–36.0)
MCV: 86.9 fL (ref 80.0–100.0)
Platelets: 311 10*3/uL (ref 150–440)
RBC: 4.67 MIL/uL (ref 4.40–5.90)
RDW: 14.1 % (ref 11.5–14.5)
WBC: 8.9 10*3/uL (ref 3.8–10.6)

## 2018-03-20 LAB — TYPE AND SCREEN
ABO/RH(D): B POS
Antibody Screen: NEGATIVE

## 2018-03-20 LAB — PROTIME-INR
INR: 0.93
Prothrombin Time: 12.4 seconds (ref 11.4–15.2)

## 2018-03-20 LAB — SURGICAL PCR SCREEN
MRSA, PCR: NEGATIVE
STAPHYLOCOCCUS AUREUS: NEGATIVE

## 2018-03-20 NOTE — Patient Instructions (Signed)
Your procedure is scheduled on: Tuesday 04/02/18 Report to [facility]. To find out your arrival time please call [phone] between 1PM - 3PM on Monday 04/01/18.  Remember: Instructions that are not followed completely may result in serious medical risk, up to and including death, or upon the discretion of your surgeon and anesthesiologist your surgery may need to be rescheduled.     _X__ 1. Do not eat food after midnight the night before your procedure.                 No gum chewing or hard candies. You may drink clear liquids up to 2 hours                 before you are scheduled to arrive for your surgery- DO not drink clear                 liquids within 2 hours of the start of your surgery.                 Clear Liquids include:  water, apple juice without pulp, clear carbohydrate                 drink such as Clearfast or Gatorade, Black Coffee or Tea (Do not add                 anything to coffee or tea).  __X__2.  On the morning of surgery brush your teeth with toothpaste and water, you                 may rinse your mouth with mouthwash if you wish.  Do not swallow any              toothpaste of mouthwash.     _X__ 3.  No Alcohol for 24 hours before or after surgery.   _X__ 4.  Do Not Smoke or use e-cigarettes For 24 Hours Prior to Your Surgery.                 Do not use any chewable tobacco products for at least 6 hours prior to                 surgery.  ____  5.  Bring all medications with you on the day of surgery if instructed.   __X__  6.  Notify your doctor if there is any change in your medical condition      (cold, fever, infections).     Do not wear jewelry, make-up, hairpins, clips or nail polish. Do not wear lotions, powders, or perfumes.  Do not shave 48 hours prior to surgery. Men may shave face and neck. Do not bring valuables to the hospital.    [facility] is not responsible for any belongings or  valuables.  Contacts, dentures/partials or body piercings may not be worn into surgery. Bring a case for your contacts, glasses or hearing aids, a denture cup will be supplied. Leave your suitcase in the car. After surgery it may be brought to your room. For patients admitted to the hospital, discharge time is determined by your treatment team.   Patients discharged the day of surgery will not be allowed to drive home.   Please read over the following fact sheets that you were given:   MRSA Information  __X__ Take these medicines the morning of surgery with A SIP OF WATER:  1. ATORVASTATIN  2. CARVEDILOL  3. FENOFIBRATE  4. LORATADINE  5. SERTRALINE  6.  ____ Fleet Enema (as directed)   __X__ Use CHG Soap/SAGE wipes as directed  ____ Use inhalers on the day of surgery  ____ Stop metformin/Janumet/Farxiga 2 days prior to surgery    ____ Take 1/2 of usual insulin dose the night before surgery. No insulin the morning          of surgery.   __X__ Stop Blood Thinners Coumadin/Plavix/Xarelto/Pleta/Pradaxa/Eliquis/Effient/Aspirin  on   Or contact your Surgeon, Cardiologist or Medical Doctor regarding  ability to stop your blood thinners  __X__ Stop Anti-inflammatories 7 days before surgery such as Advil, Ibuprofen, Motrin, Diclofenac, BC or Goodies Powder, Naprosyn, Naproxen, Aleve, Aspirin    __X__ Stop all herbal supplements, fish oil or vitamin E until after surgery.    ____ Bring C-Pap to the hospital.

## 2018-03-22 ENCOUNTER — Other Ambulatory Visit: Payer: Self-pay

## 2018-03-22 ENCOUNTER — Other Ambulatory Visit: Payer: Self-pay | Admitting: Family Medicine

## 2018-03-22 DIAGNOSIS — I10 Essential (primary) hypertension: Secondary | ICD-10-CM

## 2018-03-27 HISTORY — PX: JOINT REPLACEMENT: SHX530

## 2018-04-01 MED ORDER — CLINDAMYCIN PHOSPHATE 900 MG/50ML IV SOLN
900.0000 mg | Freq: Once | INTRAVENOUS | Status: AC
  Administered 2018-04-02: 900 mg via INTRAVENOUS

## 2018-04-02 ENCOUNTER — Inpatient Hospital Stay: Payer: Medicare HMO

## 2018-04-02 ENCOUNTER — Inpatient Hospital Stay: Payer: Medicare HMO | Admitting: Anesthesiology

## 2018-04-02 ENCOUNTER — Encounter: Payer: Self-pay | Admitting: Anesthesiology

## 2018-04-02 ENCOUNTER — Other Ambulatory Visit: Payer: Self-pay

## 2018-04-02 ENCOUNTER — Encounter
Admission: RE | Disposition: A | Discharge status: Home Health | Payer: Self-pay | Source: Ambulatory Visit | Attending: Surgery

## 2018-04-02 ENCOUNTER — Inpatient Hospital Stay
Admission: RE | Admit: 2018-04-02 | Discharge: 2018-04-04 | DRG: 470 | Disposition: A | Discharge status: Home Health | Payer: Medicare HMO | Source: Ambulatory Visit | Attending: Surgery | Admitting: Surgery

## 2018-04-02 DIAGNOSIS — Z79899 Other long term (current) drug therapy: Secondary | ICD-10-CM | POA: Diagnosis not present

## 2018-04-02 DIAGNOSIS — Z96641 Presence of right artificial hip joint: Secondary | ICD-10-CM | POA: Diagnosis not present

## 2018-04-02 DIAGNOSIS — I10 Essential (primary) hypertension: Secondary | ICD-10-CM | POA: Diagnosis not present

## 2018-04-02 DIAGNOSIS — E785 Hyperlipidemia, unspecified: Secondary | ICD-10-CM | POA: Diagnosis present

## 2018-04-02 DIAGNOSIS — Z7982 Long term (current) use of aspirin: Secondary | ICD-10-CM | POA: Diagnosis not present

## 2018-04-02 DIAGNOSIS — Z471 Aftercare following joint replacement surgery: Secondary | ICD-10-CM | POA: Diagnosis not present

## 2018-04-02 DIAGNOSIS — Z7901 Long term (current) use of anticoagulants: Secondary | ICD-10-CM

## 2018-04-02 DIAGNOSIS — M1611 Unilateral primary osteoarthritis, right hip: Secondary | ICD-10-CM | POA: Diagnosis not present

## 2018-04-02 DIAGNOSIS — R69 Illness, unspecified: Secondary | ICD-10-CM | POA: Diagnosis not present

## 2018-04-02 DIAGNOSIS — Z87891 Personal history of nicotine dependence: Secondary | ICD-10-CM | POA: Diagnosis not present

## 2018-04-02 DIAGNOSIS — J301 Allergic rhinitis due to pollen: Secondary | ICD-10-CM | POA: Diagnosis not present

## 2018-04-02 HISTORY — PX: TOTAL HIP ARTHROPLASTY: SHX124

## 2018-04-02 LAB — ABO/RH: ABO/RH(D): B POS

## 2018-04-02 SURGERY — ARTHROPLASTY, HIP, TOTAL,POSTERIOR APPROACH
Anesthesia: Spinal | Site: Hip | Laterality: Right | Wound class: Clean

## 2018-04-02 MED ORDER — TRAMADOL HCL 50 MG PO TABS: 50.0000 mg | ORAL_TABLET | Freq: Four times a day (QID) | ORAL | Status: DC | PRN

## 2018-04-02 MED ORDER — FENOFIBRATE 160 MG PO TABS
160.0000 mg | ORAL_TABLET | Freq: Every day | ORAL | Status: DC
  Administered 2018-04-03 – 2018-04-04 (×2): 160 mg via ORAL
  Filled 2018-04-02 (×2): qty 1

## 2018-04-02 MED ORDER — SODIUM CHLORIDE 0.9 % IV SOLN
INTRAVENOUS | Status: DC
  Administered 2018-04-02 – 2018-04-03 (×2): via INTRAVENOUS

## 2018-04-02 MED ORDER — BUPIVACAINE LIPOSOME 1.3 % IJ SUSP
INTRAMUSCULAR | Status: AC
  Filled 2018-04-02: qty 20

## 2018-04-02 MED ORDER — PROPOFOL 10 MG/ML IV BOLUS
INTRAVENOUS | Status: DC | PRN
  Administered 2018-04-02: 30 mg via INTRAVENOUS

## 2018-04-02 MED ORDER — FAMOTIDINE 20 MG PO TABS
20.0000 mg | ORAL_TABLET | Freq: Once | ORAL | Status: AC
  Administered 2018-04-02: 20 mg via ORAL

## 2018-04-02 MED ORDER — FENTANYL CITRATE (PF) 100 MCG/2ML IJ SOLN
INTRAMUSCULAR | Status: AC
  Filled 2018-04-02: qty 2

## 2018-04-02 MED ORDER — VASOPRESSIN 20 UNIT/ML IV SOLN
INTRAVENOUS | Status: DC | PRN
  Administered 2018-04-02: 3 [IU] via INTRAVENOUS
  Administered 2018-04-02: 1.5 [IU] via INTRAVENOUS
  Administered 2018-04-02: 1 [IU] via INTRAVENOUS

## 2018-04-02 MED ORDER — ADULT MULTIVITAMIN W/MINERALS CH
1.0000 | ORAL_TABLET | Freq: Every day | ORAL | Status: DC
  Administered 2018-04-03 – 2018-04-04 (×3): 1 via ORAL
  Filled 2018-04-02 (×3): qty 1

## 2018-04-02 MED ORDER — FLUTICASONE PROPIONATE 50 MCG/ACT NA SUSP
1.0000 | Freq: Every day | NASAL | Status: DC | PRN
  Filled 2018-04-02: qty 16

## 2018-04-02 MED ORDER — PHENYLEPHRINE HCL 10 MG/ML IJ SOLN
INTRAMUSCULAR | Status: DC | PRN
  Administered 2018-04-02: 30 ug/min via INTRAVENOUS

## 2018-04-02 MED ORDER — OXYCODONE HCL 5 MG PO TABS
5.0000 mg | ORAL_TABLET | ORAL | Status: DC | PRN
  Administered 2018-04-03 – 2018-04-04 (×3): 5 mg via ORAL
  Filled 2018-04-02 (×3): qty 1

## 2018-04-02 MED ORDER — VITAMIN C 500 MG PO TABS
1000.0000 mg | ORAL_TABLET | Freq: Every day | ORAL | Status: DC
  Administered 2018-04-03 – 2018-04-04 (×2): 1000 mg via ORAL
  Filled 2018-04-02 (×3): qty 2

## 2018-04-02 MED ORDER — TRANEXAMIC ACID 1000 MG/10ML IV SOLN
INTRAVENOUS | Status: DC | PRN
  Administered 2018-04-02: 1000 mg via TOPICAL

## 2018-04-02 MED ORDER — GLYCOPYRROLATE 0.2 MG/ML IJ SOLN
INTRAMUSCULAR | Status: DC | PRN
  Administered 2018-04-02: 0.2 mg via INTRAVENOUS

## 2018-04-02 MED ORDER — ASPIRIN EC 81 MG PO TBEC
81.0000 mg | DELAYED_RELEASE_TABLET | Freq: Every day | ORAL | Status: DC
  Administered 2018-04-03 – 2018-04-04 (×2): 81 mg via ORAL
  Filled 2018-04-02 (×2): qty 1

## 2018-04-02 MED ORDER — GLYCOPYRROLATE 0.2 MG/ML IJ SOLN
INTRAMUSCULAR | Status: AC
  Filled 2018-04-02: qty 1

## 2018-04-02 MED ORDER — SODIUM CHLORIDE FLUSH 0.9 % IV SOLN
INTRAVENOUS | Status: AC
  Filled 2018-04-02: qty 40

## 2018-04-02 MED ORDER — BENAZEPRIL HCL 20 MG PO TABS
20.0000 mg | ORAL_TABLET | Freq: Every day | ORAL | Status: DC
  Administered 2018-04-03 – 2018-04-04 (×2): 20 mg via ORAL
  Filled 2018-04-02 (×3): qty 1

## 2018-04-02 MED ORDER — EPHEDRINE SULFATE 50 MG/ML IJ SOLN
INTRAMUSCULAR | Status: DC | PRN
  Administered 2018-04-02 (×6): 10 mg via INTRAVENOUS

## 2018-04-02 MED ORDER — FENTANYL CITRATE (PF) 100 MCG/2ML IJ SOLN
INTRAMUSCULAR | Status: DC | PRN
  Administered 2018-04-02: 100 ug via INTRAVENOUS

## 2018-04-02 MED ORDER — BUPIVACAINE-EPINEPHRINE (PF) 0.5% -1:200000 IJ SOLN
INTRAMUSCULAR | Status: AC
  Filled 2018-04-02: qty 30

## 2018-04-02 MED ORDER — HYDROMORPHONE HCL 1 MG/ML IJ SOLN: 0.5000 mg | INTRAMUSCULAR | Status: DC | PRN

## 2018-04-02 MED ORDER — PROPOFOL 500 MG/50ML IV EMUL
INTRAVENOUS | Status: DC | PRN
  Administered 2018-04-02: 70 ug/kg/min via INTRAVENOUS

## 2018-04-02 MED ORDER — METOCLOPRAMIDE HCL 10 MG PO TABS: 5.0000 mg | ORAL_TABLET | Freq: Three times a day (TID) | ORAL | Status: DC | PRN

## 2018-04-02 MED ORDER — DOCUSATE SODIUM 100 MG PO CAPS
100.0000 mg | ORAL_CAPSULE | Freq: Two times a day (BID) | ORAL | Status: DC
  Administered 2018-04-02 – 2018-04-04 (×4): 100 mg via ORAL
  Filled 2018-04-02 (×4): qty 1

## 2018-04-02 MED ORDER — ACETAMINOPHEN 500 MG PO TABS
1000.0000 mg | ORAL_TABLET | Freq: Four times a day (QID) | ORAL | Status: AC
  Administered 2018-04-02 – 2018-04-03 (×4): 1000 mg via ORAL
  Filled 2018-04-02 (×4): qty 2

## 2018-04-02 MED ORDER — PANTOPRAZOLE SODIUM 40 MG PO TBEC
40.0000 mg | DELAYED_RELEASE_TABLET | Freq: Every day | ORAL | Status: DC
  Administered 2018-04-03 – 2018-04-04 (×2): 40 mg via ORAL
  Filled 2018-04-02 (×2): qty 1

## 2018-04-02 MED ORDER — KETOROLAC TROMETHAMINE 15 MG/ML IJ SOLN
15.0000 mg | Freq: Once | INTRAMUSCULAR | Status: DC
  Administered 2018-04-02: 15 mg via INTRAVENOUS

## 2018-04-02 MED ORDER — MIDAZOLAM HCL 5 MG/5ML IJ SOLN
INTRAMUSCULAR | Status: DC | PRN
  Administered 2018-04-02 (×2): 1 mg via INTRAVENOUS

## 2018-04-02 MED ORDER — CLINDAMYCIN PHOSPHATE 900 MG/50ML IV SOLN
900.0000 mg | Freq: Four times a day (QID) | INTRAVENOUS | Status: AC
  Administered 2018-04-02 – 2018-04-03 (×3): 900 mg via INTRAVENOUS
  Filled 2018-04-02 (×3): qty 50

## 2018-04-02 MED ORDER — AMLODIPINE BESYLATE 10 MG PO TABS
10.0000 mg | ORAL_TABLET | Freq: Every day | ORAL | Status: DC
  Administered 2018-04-03 – 2018-04-04 (×2): 10 mg via ORAL
  Filled 2018-04-02 (×3): qty 1

## 2018-04-02 MED ORDER — LIDOCAINE HCL (PF) 2 % IJ SOLN
INTRAMUSCULAR | Status: AC
  Filled 2018-04-02: qty 10

## 2018-04-02 MED ORDER — VITAMIN D 1000 UNITS PO TABS
1000.0000 [IU] | ORAL_TABLET | Freq: Every day | ORAL | Status: DC
Start: 2018-04-02 — End: 2018-04-04
  Administered 2018-04-03 – 2018-04-04 (×2): 1000 [IU] via ORAL
  Filled 2018-04-02 (×2): qty 1

## 2018-04-02 MED ORDER — HYDROCHLOROTHIAZIDE 12.5 MG PO CAPS
12.5000 mg | ORAL_CAPSULE | Freq: Every day | ORAL | Status: DC
  Administered 2018-04-03 – 2018-04-04 (×2): 12.5 mg via ORAL
  Filled 2018-04-02 (×2): qty 1

## 2018-04-02 MED ORDER — ONDANSETRON HCL 4 MG PO TABS: 4.0000 mg | ORAL_TABLET | Freq: Four times a day (QID) | ORAL | Status: DC | PRN

## 2018-04-02 MED ORDER — OMEGA-3-ACID ETHYL ESTERS 1 G PO CAPS
2.0000 g | ORAL_CAPSULE | Freq: Every day | ORAL | Status: DC
Start: 2018-04-02 — End: 2018-04-04
  Administered 2018-04-04: 2 g via ORAL
  Filled 2018-04-02 (×2): qty 2

## 2018-04-02 MED ORDER — FENTANYL CITRATE (PF) 100 MCG/2ML IJ SOLN
25.0000 ug | INTRAMUSCULAR | Status: DC | PRN
Start: 2018-04-02 — End: 2018-04-02

## 2018-04-02 MED ORDER — SODIUM CHLORIDE 0.9 % IV SOLN
INTRAVENOUS | Status: DC | PRN
  Administered 2018-04-02: 60 mL

## 2018-04-02 MED ORDER — MAGNESIUM HYDROXIDE 400 MG/5ML PO SUSP
30.0000 mL | Freq: Every day | ORAL | Status: DC | PRN
  Administered 2018-04-03: 30 mL via ORAL
  Filled 2018-04-02: qty 30

## 2018-04-02 MED ORDER — LORATADINE 10 MG PO TABS
10.0000 mg | ORAL_TABLET | Freq: Every day | ORAL | Status: DC
  Administered 2018-04-03 – 2018-04-04 (×2): 10 mg via ORAL
  Filled 2018-04-02 (×2): qty 1

## 2018-04-02 MED ORDER — PROPOFOL 500 MG/50ML IV EMUL
INTRAVENOUS | Status: AC
  Filled 2018-04-02: qty 50

## 2018-04-02 MED ORDER — KETOROLAC TROMETHAMINE 15 MG/ML IJ SOLN
INTRAMUSCULAR | Status: AC
  Administered 2018-04-02: 15 mg via INTRAVENOUS
  Filled 2018-04-02: qty 1

## 2018-04-02 MED ORDER — METOCLOPRAMIDE HCL 5 MG/ML IJ SOLN: 5.0000 mg | Freq: Three times a day (TID) | INTRAMUSCULAR | Status: DC | PRN

## 2018-04-02 MED ORDER — ATORVASTATIN CALCIUM 20 MG PO TABS
20.0000 mg | ORAL_TABLET | Freq: Every day | ORAL | Status: DC
  Administered 2018-04-03 – 2018-04-04 (×2): 20 mg via ORAL
  Filled 2018-04-02 (×2): qty 1

## 2018-04-02 MED ORDER — TRANEXAMIC ACID 1000 MG/10ML IV SOLN
INTRAVENOUS | Status: AC
  Filled 2018-04-02: qty 10

## 2018-04-02 MED ORDER — CLINDAMYCIN PHOSPHATE 900 MG/50ML IV SOLN
INTRAVENOUS | Status: AC
  Filled 2018-04-02: qty 50

## 2018-04-02 MED ORDER — CARVEDILOL 3.125 MG PO TABS
6.2500 mg | ORAL_TABLET | Freq: Two times a day (BID) | ORAL | Status: DC
  Administered 2018-04-02 – 2018-04-04 (×4): 6.25 mg via ORAL
  Filled 2018-04-02 (×6): qty 2

## 2018-04-02 MED ORDER — DIPHENHYDRAMINE HCL 12.5 MG/5ML PO ELIX: 12.5000 mg | ORAL_SOLUTION | ORAL | Status: DC | PRN

## 2018-04-02 MED ORDER — NEOMYCIN-POLYMYXIN B GU 40-200000 IR SOLN
Status: AC
  Filled 2018-04-02: qty 20

## 2018-04-02 MED ORDER — SERTRALINE HCL 50 MG PO TABS
25.0000 mg | ORAL_TABLET | Freq: Every day | ORAL | Status: DC
  Administered 2018-04-03 – 2018-04-04 (×2): 25 mg via ORAL
  Filled 2018-04-02 (×2): qty 1

## 2018-04-02 MED ORDER — GARLIC 705 MG PO CAPS: 1000.0000 mg | ORAL_CAPSULE | Freq: Every day | ORAL | Status: DC

## 2018-04-02 MED ORDER — KETOROLAC TROMETHAMINE 15 MG/ML IJ SOLN
7.5000 mg | Freq: Four times a day (QID) | INTRAMUSCULAR | Status: AC
  Administered 2018-04-02 – 2018-04-03 (×4): 7.5 mg via INTRAVENOUS
  Filled 2018-04-02 (×4): qty 1

## 2018-04-02 MED ORDER — AMLODIPINE BESY-BENAZEPRIL HCL 10-20 MG PO CAPS: 1.0000 | ORAL_CAPSULE | Freq: Every day | ORAL | Status: DC

## 2018-04-02 MED ORDER — ONDANSETRON HCL 4 MG/2ML IJ SOLN: 4.0000 mg | Freq: Four times a day (QID) | INTRAMUSCULAR | Status: DC | PRN

## 2018-04-02 MED ORDER — LACTATED RINGERS IV SOLN
INTRAVENOUS | Status: DC
  Administered 2018-04-02 (×3): via INTRAVENOUS

## 2018-04-02 MED ORDER — NEOMYCIN-POLYMYXIN B GU 40-200000 IR SOLN
Status: DC | PRN
  Administered 2018-04-02: 14 mL

## 2018-04-02 MED ORDER — FAMOTIDINE 20 MG PO TABS
ORAL_TABLET | ORAL | Status: AC
  Administered 2018-04-02: 20 mg via ORAL
  Filled 2018-04-02: qty 1

## 2018-04-02 MED ORDER — PHENYLEPHRINE HCL 10 MG/ML IJ SOLN
INTRAMUSCULAR | Status: AC
  Filled 2018-04-02: qty 1

## 2018-04-02 MED ORDER — NOREPINEPHRINE BITARTRATE 1 MG/ML IV SOLN
2.0000 ug/min | INTRAVENOUS | Status: DC
  Filled 2018-04-02: qty 4

## 2018-04-02 MED ORDER — BUPIVACAINE HCL (PF) 0.5 % IJ SOLN
INTRAMUSCULAR | Status: DC | PRN
  Administered 2018-04-02: 3 mL

## 2018-04-02 MED ORDER — BISACODYL 10 MG RE SUPP: 10.0000 mg | Freq: Every day | RECTAL | Status: DC | PRN

## 2018-04-02 MED ORDER — KETAMINE HCL 50 MG/ML IJ SOLN
INTRAMUSCULAR | Status: DC | PRN
  Administered 2018-04-02 (×2): 25 mg via INTRAMUSCULAR

## 2018-04-02 MED ORDER — KETAMINE HCL 50 MG/ML IJ SOLN
INTRAMUSCULAR | Status: AC
  Filled 2018-04-02: qty 10

## 2018-04-02 MED ORDER — ONDANSETRON HCL 4 MG/2ML IJ SOLN: 4.0000 mg | Freq: Once | INTRAMUSCULAR | Status: DC | PRN

## 2018-04-02 MED ORDER — ACETAMINOPHEN 325 MG PO TABS
325.0000 mg | ORAL_TABLET | Freq: Four times a day (QID) | ORAL | Status: DC | PRN
  Administered 2018-04-04: 650 mg via ORAL
  Filled 2018-04-02: qty 2

## 2018-04-02 MED ORDER — APIXABAN 2.5 MG PO TABS
2.5000 mg | ORAL_TABLET | Freq: Two times a day (BID) | ORAL | Status: DC
  Administered 2018-04-03 – 2018-04-04 (×3): 2.5 mg via ORAL
  Filled 2018-04-02 (×3): qty 1

## 2018-04-02 MED ORDER — MIDAZOLAM HCL 2 MG/2ML IJ SOLN
INTRAMUSCULAR | Status: AC
  Filled 2018-04-02: qty 2

## 2018-04-02 MED ORDER — FLEET ENEMA 7-19 GM/118ML RE ENEM: 1.0000 | ENEMA | Freq: Once | RECTAL | Status: DC | PRN

## 2018-04-02 MED ORDER — BUPIVACAINE-EPINEPHRINE (PF) 0.5% -1:200000 IJ SOLN
INTRAMUSCULAR | Status: DC | PRN
  Administered 2018-04-02: 30 mL via PERINEURAL

## 2018-04-02 SURGICAL SUPPLY — 57 items
BLADE SAGITTAL WIDE XTHICK NO (BLADE) ×2 IMPLANT
BLADE SURG SZ20 CARB STEEL (BLADE) ×2 IMPLANT
CANISTER SUCT 1200ML W/VALVE (MISCELLANEOUS) ×2 IMPLANT
CANISTER SUCT 3000ML PPV (MISCELLANEOUS) ×4 IMPLANT
CHLORAPREP W/TINT 26ML (MISCELLANEOUS) ×2 IMPLANT
DRAPE IMP U-DRAPE 54X76 (DRAPES) ×2 IMPLANT
DRAPE INCISE IOBAN 66X60 STRL (DRAPES) ×2 IMPLANT
DRAPE SHEET LG 3/4 BI-LAMINATE (DRAPES) ×2 IMPLANT
DRAPE SURG 17X11 SM STRL (DRAPES) ×4 IMPLANT
DRAPE TABLE BACK 80X90 (DRAPES) ×2 IMPLANT
DRSG OPSITE POSTOP 4X10 (GAUZE/BANDAGES/DRESSINGS) ×2 IMPLANT
DRSG OPSITE POSTOP 4X12 (GAUZE/BANDAGES/DRESSINGS) IMPLANT
DRSG OPSITE POSTOP 4X14 (GAUZE/BANDAGES/DRESSINGS) IMPLANT
ELECT BLADE 6.5 EXT (BLADE) ×2 IMPLANT
ELECT CAUTERY BLADE 6.4 (BLADE) ×2 IMPLANT
GLOVE BIO SURGEON STRL SZ7.5 (GLOVE) ×8 IMPLANT
GLOVE BIO SURGEON STRL SZ8 (GLOVE) ×8 IMPLANT
GLOVE BIOGEL PI IND STRL 8 (GLOVE) ×1 IMPLANT
GLOVE BIOGEL PI INDICATOR 8 (GLOVE) ×1
GLOVE INDICATOR 8.0 STRL GRN (GLOVE) ×2 IMPLANT
GOWN STRL REUS W/ TWL LRG LVL3 (GOWN DISPOSABLE) ×1 IMPLANT
GOWN STRL REUS W/ TWL XL LVL3 (GOWN DISPOSABLE) ×1 IMPLANT
GOWN STRL REUS W/TWL LRG LVL3 (GOWN DISPOSABLE) ×1
GOWN STRL REUS W/TWL XL LVL3 (GOWN DISPOSABLE) ×1
HEAD CERAMIC BIOLOX DELTA 36MM (Head) ×2 IMPLANT
HIP SHELL ACETAB 3H 56MM F (Shell) ×2 IMPLANT
HOOD PEEL AWAY FLYTE STAYCOOL (MISCELLANEOUS) ×6 IMPLANT
KIT TURNOVER KIT A (KITS) ×2 IMPLANT
LINER ACETABULAR G7 SZ36 F (Hips) ×2 IMPLANT
NDL SAFETY ECLIPSE 18X1.5 (NEEDLE) ×2 IMPLANT
NEEDLE FILTER BLUNT 18X 1/2SAF (NEEDLE) ×1
NEEDLE FILTER BLUNT 18X1 1/2 (NEEDLE) ×1 IMPLANT
NEEDLE HYPO 18GX1.5 SHARP (NEEDLE) ×2
NEEDLE SPNL 20GX3.5 QUINCKE YW (NEEDLE) ×2 IMPLANT
PACK HIP PROSTHESIS (MISCELLANEOUS) ×2 IMPLANT
PILLOW ABDUC SM (MISCELLANEOUS) ×2 IMPLANT
PIN STEINMAN 3/16 (PIN) ×2 IMPLANT
PIN STEINMANN 3/16X9 BAY 6PK (Pin) ×1 IMPLANT
PULSAVAC PLUS IRRIG FAN TIP (DISPOSABLE) ×2
SHELL ACETAB HIP 3H 56MM F (Shell) ×1 IMPLANT
SOL .9 NS 3000ML IRR  AL (IV SOLUTION) ×1
SOL .9 NS 3000ML IRR UROMATIC (IV SOLUTION) ×1 IMPLANT
SPONGE LAP 18X18 5 PK (GAUZE/BANDAGES/DRESSINGS) IMPLANT
ST PIN 3/16X9 BAY 6PK (Pin) ×2 IMPLANT
STAPLER SKIN PROX 35W (STAPLE) ×2 IMPLANT
STEM FEMORAL SZ12 140MM (Stem) ×2 IMPLANT
SUT TICRON 2-0 30IN 311381 (SUTURE) ×6 IMPLANT
SUT VIC AB 0 CT1 36 (SUTURE) ×2 IMPLANT
SUT VIC AB 1 CT1 36 (SUTURE) ×4 IMPLANT
SUT VIC AB 2-0 CT1 (SUTURE) ×8 IMPLANT
SYR 10ML LL (SYRINGE) ×2 IMPLANT
SYR 20CC LL (SYRINGE) ×2 IMPLANT
SYR 30ML LL (SYRINGE) ×6 IMPLANT
TAPE TRANSPORE STRL 2 31045 (GAUZE/BANDAGES/DRESSINGS) ×2 IMPLANT
TIP FAN IRRIG PULSAVAC PLUS (DISPOSABLE) ×1 IMPLANT
TRAY FOLEY W/METER SILVER 16FR (SET/KITS/TRAYS/PACK) ×2 IMPLANT
WATER STERILE IRR 1000ML POUR (IV SOLUTION) ×2 IMPLANT

## 2018-04-02 NOTE — NC FL2 (Signed)
[place] LEVEL OF CARE SCREENING TOOL     IDENTIFICATION  Patient Name: Gary Kramer Birthdate: 1946/03/26 Sex: male Admission Date (Current Location): 04/02/2018  [place] and [place] Number:  Engineering geologist and Address:  [facility], [address] [place], [place] [address]      Provider Number: [id number]  Attending Physician Name and Address:  Corky Mull, MD  Relative Name and Phone Number:       Current Level of Care: Hospital Recommended Level of Care: [place] Prior Approval Number:    Date Approved/Denied:   PASRR Number: [id number])  Discharge Plan: SNF    Current Diagnoses: Patient Active Problem List   Diagnosis Date Noted  . Status post total hip replacement, right 04/02/2018  . Recurrent major depressive disorder, in partial remission ([place]) 01/16/2018  . Chronic allergic rhinitis 01/05/2017  . Essential hypertension 07/20/2015  . Hyperlipidemia 07/20/2015  . Erectile dysfunction 07/20/2015  . Depression 07/20/2015  . Allergic rhinitis due to pollen 07/20/2015  . Degenerative joint disease of hand 07/20/2015    Orientation RESPIRATION BLADDER Height & Weight     Self, Time, Place, Situation  Normal Continent Weight: 236 lb 8.9 oz (107.3 kg) Height:  6' (182.9 cm)  BEHAVIORAL SYMPTOMS/MOOD NEUROLOGICAL BOWEL NUTRITION STATUS      Continent Diet(Diet: Regular. )  AMBULATORY STATUS COMMUNICATION OF NEEDS Skin   Extensive Assist Verbally Surgical wounds(Incision: Right Hip. )                       Personal Care Assistance Level of Assistance  Bathing, Feeding, Dressing Bathing Assistance: Limited assistance Feeding assistance: Independent Dressing Assistance: Limited assistance     Functional Limitations Info  Sight, Hearing, Speech Sight Info: Adequate Hearing Info: Adequate Speech Info: Adequate    SPECIAL CARE FACTORS FREQUENCY  PT (By licensed PT), OT (By  licensed OT)     PT Frequency: (5) OT Frequency: (5)            Contractures      Additional Factors Info  Code Status, Allergies Code Status Info: (Full Code. ) Allergies Info: (Cefaclor, Penicillin V Potassium)           Current Medications (04/02/2018):  This is the current hospital active medication list Current Facility-Administered Medications  Medication Dose Route Frequency Provider Last Rate Last Dose  . 0.9 %  sodium chloride infusion   Intravenous Continuous Poggi, [place] Cork, MD 75 mL/hr at 04/02/18 1325    . acetaminophen (TYLENOL) tablet 1,000 mg  1,000 mg Oral Q6H Poggi, [place] Cork, MD      . Derrill Memo ON 04/03/2018] acetaminophen (TYLENOL) tablet 325-650 mg  325-650 mg Oral Q6H PRN Poggi, [place] Cork, MD      . amLODipine (NORVASC) tablet 10 mg  10 mg Oral Daily Maccia, Melissa D, RPH       And  . benazepril (LOTENSIN) tablet 20 mg  20 mg Oral Daily Ramond Dial, RPH      . [START ON 04/03/2018] apixaban (ELIQUIS) tablet 2.5 mg  2.5 mg Oral BID Poggi, [place] Cork, MD      . aspirin EC tablet 81 mg  81 mg Oral Daily Poggi, [place] Cork, MD      . Derrill Memo ON 04/03/2018] atorvastatin (LIPITOR) tablet 20 mg  20 mg Oral Daily Poggi, [place] Cork, MD      . bisacodyl (DULCOLAX) suppository 10 mg  10 mg Rectal  Daily PRN Poggi, [place] Cork, MD      . carvedilol (COREG) tablet 6.25 mg  6.25 mg Oral BID Poggi, [place] Cork, MD      . cholecalciferol (VITAMIN D) tablet 1,000 Units  1,000 Units Oral Daily Poggi, [place] Cork, MD      . clindamycin (CLEOCIN) IVPB 900 mg  900 mg Intravenous Q6H Poggi, [place] Cork, MD      . diphenhydrAMINE (BENADRYL) 12.5 MG/5ML elixir 12.5-25 mg  12.5-25 mg Oral Q4H PRN Poggi, [place] Cork, MD      . docusate sodium (COLACE) capsule 100 mg  100 mg Oral BID Poggi, [place] Cork, MD      . Derrill Memo ON 04/03/2018] fenofibrate tablet 160 mg  160 mg Oral Daily Poggi, [place] Cork, MD      . fluticasone (FLONASE) 50 MCG/ACT nasal spray 1 spray  1 spray Each Nare Daily PRN Poggi, [place] Cork, MD      . Derrill Memo ON 04/03/2018]  hydrochlorothiazide (MICROZIDE) capsule 12.5 mg  12.5 mg Oral Daily Poggi, [place] Cork, MD      . HYDROmorphone (DILAUDID) injection 0.5-1 mg  0.5-1 mg Intravenous Q4H PRN Poggi, [place] Cork, MD      . ketorolac (TORADOL) 15 MG/ML injection 7.5 mg  7.5 mg Intravenous Q6H Poggi, [place] Cork, MD      . loratadine (CLARITIN) tablet 10 mg  10 mg Oral Daily Poggi, [place] Cork, MD      . magnesium hydroxide (MILK OF MAGNESIA) suspension 30 mL  30 mL Oral Daily PRN Poggi, [place] Cork, MD      . metoCLOPramide (REGLAN) tablet 5-10 mg  5-10 mg Oral Q8H PRN Poggi, [place] Cork, MD       Or  . metoCLOPramide (REGLAN) injection 5-10 mg  5-10 mg Intravenous Q8H PRN Poggi, [place] Cork, MD      . Derrill Memo ON 04/03/2018] multivitamin with minerals tablet 1 tablet  1 tablet Oral Q0600 Poggi, [place] Cork, MD      . omega-3 acid ethyl esters (LOVAZA) capsule 2 g  2 g Oral Daily Poggi, [place] Cork, MD      . ondansetron (ZOFRAN) tablet 4 mg  4 mg Oral Q6H PRN Poggi, [place] Cork, MD       Or  . ondansetron (ZOFRAN) injection 4 mg  4 mg Intravenous Q6H PRN Poggi, [place] Cork, MD      . oxyCODONE (Oxy IR/ROXICODONE) immediate release tablet 5-10 mg  5-10 mg Oral Q4H PRN Poggi, [place] Cork, MD      . pantoprazole (PROTONIX) EC tablet 40 mg  40 mg Oral Daily Poggi, [place] Cork, MD      . Derrill Memo ON 04/03/2018] sertraline (ZOLOFT) tablet 25 mg  25 mg Oral Q0600 Poggi, [place] Cork, MD      . sodium phosphate (FLEET) 7-19 GM/118ML enema 1 enema  1 enema Rectal Once PRN Poggi, [place] Cork, MD      . traMADol Veatrice Bourbon) tablet 50 mg  50 mg Oral Q6H PRN Poggi, [place] Cork, MD      . vitamin C (ASCORBIC ACID) tablet 1,000 mg  1,000 mg Oral Daily Poggi, [place] Cork, MD         Discharge Medications: Please see discharge summary for a list of discharge medications.  Relevant Imaging Results:  Relevant Lab Results:   Additional Information (SSN: [id number])  Dianara Smullen, Veronia Beets, LCSW

## 2018-04-02 NOTE — Transfer of Care (Signed)
Immediate Anesthesia Transfer of Care Note  Patient: Gary Kramer  Procedure(s) Performed: TOTAL HIP ARTHROPLASTY (Right Hip)  Patient Location: PACU  Anesthesia Type:Spinal  Level of Consciousness: drowsy  Airway & Oxygen Therapy: Patient Spontanous Breathing and Patient connected to nasal cannula oxygen  Post-op Assessment: Report given to RN and Post -op Vital signs reviewed and stable  Post vital signs: Reviewed and stable  Last Vitals:  Vitals Value Taken Time  BP 76/48 04/02/2018 10:13 AM  Temp    Pulse 70 04/02/2018 10:15 AM  Resp 12 04/02/2018 10:15 AM  SpO2 94 % 04/02/2018 10:15 AM  Vitals shown include unvalidated device data.  Last Pain:  Vitals:   04/02/18 0639  TempSrc: Tympanic  PainSc: 7          Complications: No apparent anesthesia complications

## 2018-04-02 NOTE — Op Note (Signed)
04/02/2018  10:13 AM  Patient:   Gary Kramer  Pre-Op Diagnosis:   Degenerative joint disease, right hip.  Post-Op Diagnosis:   Same.  Procedure:   Right total hip arthroplasty.  Surgeon:   Pascal Lux, MD  Assistant:   Cameron Proud, PA-C  Anesthesia:   Spinal  Findings:   As above.  Complications:   None  EBL:   300 cc  Fluids:   2800 cc crystalloid  UOP:   300 cc  TT:   None  Drains:   None  Closure:   Staples  Implants:   Biomet press-fit system with a #12 laterally offset Echo femoral stem, a 56 mm acetabular shell with an E-poly hi-wall liner, and a 36 mm ceramic head with a +0 mm neck.  Brief Clinical Note:   The patient is a 72 year old male with a long history of progressively worsening right hip pain.  His symptoms have progressed despite medications, activity modification, etc.  His history and examination are consistent with advanced degenerative joint disease confirmed by plain radiographs.  The patient presents at this time for a right total hip arthroplasty.   Procedure:   The patient was brought into the operating room. After adequate spinal anesthesia was obtained, the patient was repositioned in the left lateral decubitus position and secured using a lateral hip positioner. The right hip and lower extremity were prepped with ChloroPrep solution before being draped sterilely. Preoperative antibiotics were administered. A timeout was performed to verify the appropriate surgical site before a standard posterior approach to the hip was made through an approximately 4-5 inch incision. The incision was carried down through the subcutaneous tissues to expose the gluteal fascia and proximal end of the iliotibial band. These structures were split the length of the incision and the Charnley self-retaining hip retractor placed. The bursal tissues were swept posteriorly to expose the short external rotators. The anterior border of the piriformis tendon was  identified and this plane developed down through the capsule to enter the joint. A flap of tissue was elevated off the posterior aspect of the femoral neck and greater trochanter and retracted posteriorly. This flap included the piriformis tendon, the short external rotators, and the posterior capsule. The soft tissues were elevated off the lateral aspect of the ilium and a large Steinmann pin placed bicortically. With the right leg aligned over the left, a drill bit was placed into the greater trochanter parallel to the Steinmann pin and the distance between these two pins measured in order to optimize leg lengths postoperatively. The drill bit was removed and the hip dislocated. The piriformis fossa was debrided of soft tissues before the intramedullary canal was accessed through this point using a triple step reamer. The canal was reamed sequentially beginning with a #7 tapered reamer and progressing to a #12 tapered reamer. This provided excellent circumferential chatter. Using the appropriate guide, a femoral neck cut was made 10-12 mm above the lesser trochanter. The femoral head was removed.  Attention was directed to the acetabular side. The labrum was debrided circumferentially before the ligamentum teres was removed using a large curette. A line was drawn on the drapes corresponding to the native version of the acetabulum. This line was used as a guide while the acetabulum was reamed sequentially beginning with a 43 mm reamer and progressing to a 55 mm reamer. This provided excellent circumferential chatter. The 55 mm trial acetabulum was positioned and found to fit quite well. Therefore, the 56 mm  acetabular shell was selected and impacted into place with care taken to maintain the appropriate version. The trial high wall liner was inserted.  Attention was redirected to the femoral side. A box osteotome was used to establish version before the canal was broached sequentially beginning with a #9 broach  and progressing to a #12 broach. This was left in place and several trial reductions performed using both a standard and laterally offset neck options, as well as the -6 mm, +0 mm, and +3 mm neck lengths. The permanent E-polyethylene hi-wall liner was impacted into the acetabular shell and its locking mechanism verified using a quarter-inch osteotome. Next, the #12 lateral offset femoral stem was impacted into place with care taken to maintain the appropriate version. A repeat trial reduction was performed using the +0 mm and +3 mm neck lengths. The +0 mm neck length demonstrated excellent stability both in extension and external rotation as well as with flexion to 90 and internal rotation beyond 70. It also was stable in the position of sleep. In addition, leg lengths appeared to be restored appropriately, both by reassessing the position of the right leg over the left, as well as by measuring the distance between the Steinmann pin and the drill bit. The 36 mm ceramic head with the +0 mm neck was impacted onto the stem of the femoral component. The Morse taper locking mechanism was verified using manual distraction before the head was relocated and placed through a range of motion with the findings as described above.  The wound was copiously irrigated with bacitracin saline solution via the jet lavage system before the peri-incisional and pericapsular tissues were injected with 30 cc of 0.5% Sensorcaine with epinephrine and 20 cc of Exparel diluted out to 60 cc with normal saline to help with postoperative analgesia. The posterior flap was reapproximated to the posterior aspect of the greater trochanter using #2 Tycron interrupted sutures placed through drill holes. Several additional #2 Tycron interrupted sutures were used to reinforce this layer of closure. The iliotibial band was reapproximated using #1 Vicryl interrupted sutures before the gluteal fascia was closed using a running #1 Vicryl suture. At this  point, 1 g of transexemic acid in 10 cc of normal saline was injected into the joint to help reduce postoperative bleeding. The subcutaneous tissues were closed in several layers using 2-0 Vicryl interrupted sutures before the skin was closed using staples. A sterile occlusive dressing was applied to the wound before the patient was placed into an abduction wedge pillow. The patient was then rolled back into the supine position on his hospital bed before being awakened and returned to the recovery room in satisfactory condition after tolerating the procedure well.

## 2018-04-02 NOTE — Anesthesia Post-op Follow-up Note (Signed)
Anesthesia QCDR form completed.        

## 2018-04-02 NOTE — Evaluation (Signed)
Physical Therapy Evaluation Patient Details Name: Gary Kramer MRN: [id number] DOB: Mar 22, 1946 Today's Date: 04/02/2018   History of Present Illness  72 y/o male s/p R total hip replacement 5/7 (posterior approach).  Pt had L knee scope in February.  Clinical Impression  Pt did very well with POD0 PT exam.  He was able to rise to standing and do most of ~10 minutes of exercises (apart from exam) with out assist and even tolerated some resistance with hip ABD and AROM for SLRs.  He had some limp and hesitation with ~17ft of ambulation but had minimal actual pain t/o the session.  Pt hoping to go home (lives alone, but son and neighbor will be able to assist) and is eager to work hard with PT.  Pt may need new rolling walker secondary to his old one being "pretty beat up."    Follow Up Recommendations Home health PT(per continued improvement)    Equipment Recommendations  Rolling walker with 5" wheels    Recommendations for Other Services       Precautions / Restrictions Precautions Precautions: Posterior Hip;Fall Precaution Booklet Issued: Yes (comment) Restrictions Weight Bearing Restrictions: Yes RLE Weight Bearing: Weight bearing as tolerated      Mobility  Bed Mobility Overal bed mobility: Needs Assistance Bed Mobility: Supine to Sit     Supine to sit: Min assist     General bed mobility comments: light assist to elevate trunk and insure R hip precautions are maintained  Transfers Overall transfer level: Modified independent Equipment used: Rolling walker (2 wheeled)             General transfer comment: Pt needed extra cuing/explaination to use UEs appropriate and mind precautions but overall was able to rise once he was set up properly  Ambulation/Gait Ambulation/Gait assistance: Supervision Ambulation Distance (Feet): 25 Feet Assistive device: Rolling walker (2 wheeled)       General Gait Details: Pt did well with in-room ambulation but was reliant  on the walker and despite good strength did have noticible limp on the R.  No LOBs, dizziness, etc but he did fatigue with the effort.   Stairs            Wheelchair Mobility    Modified Rankin (Stroke Patients Only)       Balance Overall balance assessment: Modified Independent                                           Pertinent Vitals/Pain Pain Assessment: 0-10 Pain Score: 1     Home Living Family/patient expects to be discharged to:: Private residence Living Arrangements: Alone Available Help at Discharge: Family;Neighbor   Home Access: Stairs to enter Entrance Stairs-Rails: Can reach both Entrance Stairs-Number of Steps: [place]: [place] - 2 wheels;Bedside commode(reports walker is in bad shape/worn out)      Prior Function Level of Independence: Independent         Comments: Pt reports that he is out of the home almost daily and generally stays pretty active      Hand Dominance        Extremity/Trunk Assessment   Upper Extremity Assessment Upper Extremity Assessment: Overall WFL for tasks assessed    Lower Extremity Assessment Lower Extremity Assessment: Overall WFL for tasks assessed(expected R LE post-op weakness, but AROM in all planes)  Communication   Communication: No difficulties  Cognition Arousal/Alertness: Awake/alert Behavior During Therapy: WFL for tasks assessed/performed Overall Cognitive Status: Within Functional Limits for tasks assessed                                        General Comments      Exercises Total Joint Exercises Ankle Circles/Pumps: Strengthening;10 reps Quad Sets: Strengthening;10 reps Gluteal Sets: Strengthening;10 reps Short Arc Quad: AROM;10 reps Heel Slides: AROM;10 reps Hip ABduction/ADduction: Strengthening;10 reps Straight Leg Raises: AROM;10 reps(able to lift against gravity on 1st reps with min warm up)   Assessment/Plan    PT Assessment  Patient needs continued PT services  PT Problem List Decreased strength;Decreased range of motion;Decreased activity tolerance;Decreased balance;Decreased mobility;Decreased coordination;Decreased knowledge of use of DME;Decreased safety awareness;Decreased knowledge of precautions       PT Treatment Interventions DME instruction;Stair training;Gait training;Functional mobility training;Therapeutic activities;Therapeutic exercise;Neuromuscular re-education;Balance training;Patient/family education    PT Goals (Current goals can be found in the Care Plan section)  Acute Rehab PT Goals Patient Stated Goal: go home if he can PT Goal Formulation: With patient Time For Goal Achievement: 04/16/18 Potential to Achieve Goals: Good    Frequency BID   Barriers to discharge        Co-evaluation               AM-PAC PT "6 Clicks" Daily Activity  Outcome Measure Difficulty turning over in bed (including adjusting bedclothes, sheets and blankets)?: A Little Difficulty moving from lying on back to sitting on the side of the bed? : Unable Difficulty sitting down on and standing up from a chair with arms (e.g., wheelchair, bedside commode, etc,.)?: A Little Help needed moving to and from a bed to chair (including a wheelchair)?: A Little Help needed walking in hospital room?: A Little Help needed climbing 3-5 steps with a railing? : A Lot 6 Click Score: 15    End of Session Equipment Utilized During Treatment: Gait belt Activity Tolerance: Patient tolerated treatment well Patient left: with chair alarm set;with call bell/phone within reach;with nursing/sitter in room Nurse Communication: Mobility status PT Visit Diagnosis: Muscle weakness (generalized) (M62.81);Difficulty in walking, not elsewhere classified (R26.2)    Time: 1062-6948 PT Time Calculation (min) (ACUTE ONLY): 27 min   Charges:   PT Evaluation $PT Eval Low Complexity: 1 Low PT Treatments $Therapeutic Exercise: 8-22  mins   PT G Codes:        Kreg Shropshire, DPT 04/02/2018, 5:48 PM

## 2018-04-02 NOTE — H&P (Signed)
Paper H&P to be scanned into permanent record. H&P reviewed and patient re-examined. No changes. 

## 2018-04-02 NOTE — Progress Notes (Signed)
PHARMACIST - PHYSICIAN ORDER COMMUNICATION  CONCERNING: P&T Medication Policy on Herbal Medications  DESCRIPTION:  This patient's order for:  garlic  has been noted.  This product(s) is classified as an "herbal" or natural product. Due to a lack of definitive safety studies or FDA approval, nonstandard manufacturing practices, plus the potential risk of unknown drug-drug interactions while on inpatient medications, the Pharmacy and Therapeutics Committee does not permit the use of "herbal" or natural products of this type within [employer].   ACTION TAKEN: The pharmacy department is unable to verify this order at this time and your patient has been informed of this safety policy. Please reevaluate patient's clinical condition at discharge and address if the herbal or natural product(s) should be resumed at that time.   

## 2018-04-02 NOTE — Anesthesia Procedure Notes (Signed)
Spinal  Patient location during procedure: OR Start time: 04/02/2018 7:45 AM End time: 04/02/2018 7:52 AM Staffing Resident/CRNA: Bernardo Heater, CRNA Performed: resident/CRNA  Preanesthetic Checklist Completed: patient identified, site marked, surgical consent, pre-op evaluation, timeout performed, IV checked, risks and benefits discussed and monitors and equipment checked Spinal Block Patient position: sitting Prep: ChloraPrep Patient monitoring: heart rate, continuous pulse ox, blood pressure and cardiac monitor Approach: midline Location: L2-3 Injection technique: single-shot Needle Needle type: Introducer and Pencan  Needle gauge: 24 G Needle length: 9 cm Additional Notes Negative paresthesia. Negative blood return. Positive free-flowing CSF. Expiration date of kit checked and confirmed. Patient tolerated procedure well, without complications.

## 2018-04-02 NOTE — Anesthesia Preprocedure Evaluation (Signed)
Anesthesia Evaluation  Patient identified by MRN, date of birth, ID band Patient awake    Reviewed: Allergy & Precautions, NPO status , Patient's Chart, lab work & pertinent test results, reviewed documented beta blocker date and time   Airway Mallampati: III  TM Distance: >3 FB     Dental  (+) Chipped   Pulmonary former smoker,           Cardiovascular hypertension, Pt. on medications and Pt. on home beta blockers      Neuro/Psych PSYCHIATRIC DISORDERS Depression    GI/Hepatic   Endo/Other    Renal/GU      Musculoskeletal  (+) Arthritis ,   Abdominal   Peds  Hematology   Anesthesia Other Findings   Reproductive/Obstetrics                             Anesthesia Physical Anesthesia Plan  ASA: III  Anesthesia Plan: Spinal   Post-op Pain Management:    Induction:   PONV Risk Score and Plan:   Airway Management Planned:   Additional Equipment:   Intra-op Plan:   Post-operative Plan:   Informed Consent: I have reviewed the patients History and Physical, chart, labs and discussed the procedure including the risks, benefits and alternatives for the proposed anesthesia with the patient or authorized representative who has indicated his/her understanding and acceptance.     Plan Discussed with: CRNA  Anesthesia Plan Comments:         Anesthesia Quick Evaluation

## 2018-04-03 LAB — BASIC METABOLIC PANEL
Anion gap: 5 (ref 5–15)
BUN: 27 mg/dL — ABNORMAL HIGH (ref 6–20)
CALCIUM: 8.2 mg/dL — AB (ref 8.9–10.3)
CO2: 25 mmol/L (ref 22–32)
Chloride: 101 mmol/L (ref 101–111)
Creatinine, Ser: 1.66 mg/dL — ABNORMAL HIGH (ref 0.61–1.24)
GFR calc Af Amer: 47 mL/min — ABNORMAL LOW (ref >60)
GFR calc non Af Amer: 40 mL/min — ABNORMAL LOW (ref >60)
GLUCOSE: 129 mg/dL — AB (ref 65–99)
Potassium: 3.8 mmol/L (ref 3.5–5.1)
Sodium: 131 mmol/L — ABNORMAL LOW (ref 135–145)

## 2018-04-03 LAB — CBC WITH DIFFERENTIAL/PLATELET
BASOS PCT: 0 %
Basophils Absolute: 0 10*3/uL (ref 0–0.1)
EOS ABS: 0 10*3/uL (ref 0–0.7)
EOS PCT: 0 %
HEMATOCRIT: 29.7 % — AB (ref 40.0–52.0)
Hemoglobin: 10.3 g/dL — ABNORMAL LOW (ref 13.0–18.0)
Lymphocytes Relative: 9 %
Lymphs Abs: 1.1 10*3/uL (ref 1.0–3.6)
MCH: 29.7 pg (ref 26.0–34.0)
MCHC: 34.6 g/dL (ref 32.0–36.0)
MCV: 85.9 fL (ref 80.0–100.0)
MONO ABS: 1.3 10*3/uL — AB (ref 0.2–1.0)
Monocytes Relative: 11 %
NEUTROS ABS: 9.2 10*3/uL — AB (ref 1.4–6.5)
Neutrophils Relative %: 80 %
Platelets: 261 10*3/uL (ref 150–440)
RBC: 3.46 MIL/uL — ABNORMAL LOW (ref 4.40–5.90)
RDW: 14.1 % (ref 11.5–14.5)
WBC: 11.6 10*3/uL — ABNORMAL HIGH (ref 3.8–10.6)

## 2018-04-03 NOTE — Progress Notes (Signed)
Clinical Education officer, museum (CSW) received SNF consult. PT is recommending home health. RN case manager aware of above. Please reconsult it future social work needs arise. CSW signing off.   [employer], LCSW [phone]

## 2018-04-03 NOTE — Progress Notes (Signed)
Physical Therapy Treatment Patient Details Name: Gary Kramer MRN: [id number] DOB: [birth year] Today's Date: 04/03/2018    History of Present Illness 72 y/o male s/p R total hip replacement 5/7 (posterior approach).  Pt had L knee scope in February.    PT Comments    Patient requested to defer step training until tomorrow due to feeling "sore and worn out" from treatment this morning.  PT reviewed appropriate stair negotiation and pt expressed understanding. He is familiar due to recent knee surgery and PT following.  Pt's BP should be monitored prior to stair negotiation due to being hypotensive since surgery.  PT reviewed HEP and pt was able to complete sets of 15-20 of all exercises without report of pain increase.  Pt still demonstrates a tendency to flex hip to 90 during functional mobility and PT advised pt to practice STS's repeatedly with focus on hip precautions.  Pt was able to perform without difficulty.  PT answered pt's questions concerning timeline of rehabilitation and what to expect in the next few weeks.  Pt will continue to benefit from skilled PT with focus on strength, functional mobility, safe use of AD and gait training.   Follow Up Recommendations  Home health PT(per continued improvement)     Equipment Recommendations  None recommended by PT    Recommendations for Other Services       Precautions / Restrictions Precautions Precautions: Posterior Hip;Fall Precaution Booklet Issued: Yes (comment) Restrictions Weight Bearing Restrictions: Yes RLE Weight Bearing: Weight bearing as tolerated    Mobility  Bed Mobility Overal bed mobility: (Pt in chair upon PT arrival.)                Transfers Overall transfer level: Needs assistance Equipment used: Rolling walker (2 wheeled) Transfers: Sit to/from Stand Sit to Stand: Supervision         General transfer comment: Demonstrated greater ability to perform STS transfer minding hip precautions.  Pt  demosntrated good UE strength to control ascent and descent.  Ambulation/Gait Ambulation/Gait assistance: (Did not perform.)               Stairs             Wheelchair Mobility    Modified Rankin (Stroke Patients Only)       Balance Overall balance assessment: Modified Independent                                          Cognition Arousal/Alertness: Awake/alert Behavior During Therapy: WFL for tasks assessed/performed Overall Cognitive Status: Within Functional Limits for tasks assessed                                        Exercises Total Joint Exercises Quad Sets: Strengthening;Right;15 reps;Seated Long Arc Quad: Strengthening;15 reps;Right;Seated Other Exercises Other Exercises: Seated marching with VC's for pt lean back to maintain hip precautions. Other Exercises: Reviewed HEP to ensure that pt is comfortable.  Pt expressed understanding. Other Exercises: Discussed stair negotiation which pt asked to defer until tomorrow due to soreness and fatigue. Other Exercises: STS from recliner x3 using RW and focusing on hip precautions    General Comments        Pertinent Vitals/Pain Pain Assessment: Faces Faces Pain Scale: Hurts even more Pain Location: Pt asked  to defer steps until tomorrow stating that he is sore and "worn out" from treatment this morning. Pain Descriptors / Indicators: Aching Pain Intervention(s): Limited activity within patient's tolerance;Monitored during session;Patient requesting pain meds-RN notified    Home Living                      Prior Function            PT Goals (current goals can now be found in the care plan section) Acute Rehab PT Goals Patient Stated Goal: go home if he can PT Goal Formulation: With patient Time For Goal Achievement: 04/16/18 Potential to Achieve Goals: Good    Frequency    BID      PT Plan Current plan remains appropriate    Co-evaluation               AM-PAC PT "6 Clicks" Daily Activity  Outcome Measure  Difficulty turning over in bed (including adjusting bedclothes, sheets and blankets)?: A Little Difficulty moving from lying on back to sitting on the side of the bed? : A Lot Difficulty sitting down on and standing up from a chair with arms (e.g., wheelchair, bedside commode, etc,.)?: A Little Help needed moving to and from a bed to chair (including a wheelchair)?: A Little Help needed walking in hospital room?: A Little Help needed climbing 3-5 steps with a railing? : A Little 6 Click Score: 17    End of Session Equipment Utilized During Treatment: Gait belt Activity Tolerance: Patient tolerated treatment well Patient left: with chair alarm set;with call bell/phone within reach;in chair Nurse Communication: Mobility status;Patient requests pain meds PT Visit Diagnosis: Muscle weakness (generalized) (M62.81);Difficulty in walking, not elsewhere classified (R26.2);Pain Pain - Right/Left: Right Pain - part of body: Hip     Time: 1420-1500 PT Time Calculation (min) (ACUTE ONLY): 40 min  Charges:  $Therapeutic Exercise: 8-22 mins $Therapeutic Activity: 8-22 mins                    G Codes:  Functional Assessment Tool Used: AM-PAC 6 Clicks Basic Mobility    Roxanne Gates, PT, DPT    Roxanne Gates 04/03/2018, 3:21 PM

## 2018-04-03 NOTE — Progress Notes (Signed)
Physical Therapy Treatment Patient Details Name: Gary Kramer MRN: [id number] DOB: May 03, 1946 Today's Date: 04/03/2018    History of Present Illness 72 y/o male s/p R total hip replacement 5/7 (posterior approach).  Pt had L knee scope in February.    PT Comments    Pt able to progress ambulatory distance and demonstrate improved gait today.  PT reviewed hip precautions and instructed pt in performing repeated STS from recliner with appropriate body mechanics.  Appropriate car transfer information also introduced and pt expressed understanding.  PT introduced seated exercises and reviewed previous HEP and pt demonstrated ability to perform all there ex.  Pt required supervision for STS transfer and ambulation for proper body mechanics and safety when using RW.  Pt gait speed improved as ambulation progressed but PT advised pt in being mindful of movement and educated pt concerning fall prevention.  Pt expressed understanding and reported aching pain of 6/10 during mobility.  0/10 pain reported at rest.  Pt will continue to benefit from skilled PT with focus on safe management of AD, gait training, stair negotiation, strength and pain management.  Follow Up Recommendations  Home health PT(per continued improvement)     Equipment Recommendations       Recommendations for Other Services       Precautions / Restrictions Precautions Precautions: Posterior Hip;Fall Precaution Booklet Issued: Yes (comment) Restrictions Weight Bearing Restrictions: Yes RLE Weight Bearing: Weight bearing as tolerated    Mobility  Bed Mobility Overal bed mobility: (Pt in chair upon PT arrival.)                Transfers Overall transfer level: Needs assistance Equipment used: Rolling walker (2 wheeled) Transfers: Sit to/from Stand Sit to Stand: Supervision         General transfer comment: Pt able to physically perform STS but required frequent reminders for body mechanics and to be  mindful of hip precautions.  Ambulation/Gait Ambulation/Gait assistance: Supervision Ambulation Distance (Feet): 200 Feet Assistive device: Rolling walker (2 wheeled)     Gait velocity interpretation: 1.31 - 2.62 ft/sec, indicative of limited community ambulator General Gait Details: Gait pattern improving-step to with L LE and decreased stance time on RLE, frequent VC's for avoidance of pushing RW anteriorly away from body.  Pt expressed understanding and PT provided education concerning safe use of RW and hip precautions.   Stairs             Wheelchair Mobility    Modified Rankin (Stroke Patients Only)       Balance Overall balance assessment: Modified Independent                                          Cognition Arousal/Alertness: Awake/alert Behavior During Therapy: WFL for tasks assessed/performed Overall Cognitive Status: Within Functional Limits for tasks assessed                                        Exercises Total Joint Exercises Ankle Circles/Pumps: 20 reps;AROM;Both;Seated Quad Sets: Strengthening;Right;10 reps;Seated Long Arc Quad: Strengthening;Right;10 reps;Seated Other Exercises Other Exercises: Seated march with VC's for pt to lean back to maintain hip precautions.  pt was able to complete 10 without pain increase. Other Exercises: STS x4 to practice maintaining hip precautions. Other Exercises: Educated pt concerning safe  car transfers and pt expressed understanding.    General Comments        Pertinent Vitals/Pain Pain Assessment: 0-10 Pain Score: 6  Pain Descriptors / Indicators: Aching Pain Intervention(s): Monitored during session;Limited activity within patient's tolerance    Home Living                      Prior Function            PT Goals (current goals can now be found in the care plan section) Acute Rehab PT Goals Patient Stated Goal: go home if he can PT Goal Formulation:  With patient Time For Goal Achievement: 04/16/18 Potential to Achieve Goals: Good Progress towards PT goals: Progressing toward goals    Frequency    BID      PT Plan Current plan remains appropriate    Co-evaluation              AM-PAC PT "6 Clicks" Daily Activity  Outcome Measure  Difficulty turning over in bed (including adjusting bedclothes, sheets and blankets)?: A Little Difficulty moving from lying on back to sitting on the side of the bed? : A Lot Difficulty sitting down on and standing up from a chair with arms (e.g., wheelchair, bedside commode, etc,.)?: A Little Help needed moving to and from a bed to chair (including a wheelchair)?: A Little Help needed walking in hospital room?: A Little Help needed climbing 3-5 steps with a railing? : A Little 6 Click Score: 17    End of Session Equipment Utilized During Treatment: Gait belt Activity Tolerance: Patient tolerated treatment well Patient left: with chair alarm set;with call bell/phone within reach;in chair   PT Visit Diagnosis: Muscle weakness (generalized) (M62.81);Difficulty in walking, not elsewhere classified (R26.2);Pain Pain - Right/Left: Right Pain - part of body: Hip     Time: 1000-1030 PT Time Calculation (min) (ACUTE ONLY): 30 min  Charges:  $Gait Training: 8-22 mins $Therapeutic Exercise: 8-22 mins                    G Codes:  Functional Assessment Tool Used: AM-PAC 6 Clicks Basic Mobility    Roxanne Gates, PT, DPT    Roxanne Gates 04/03/2018, 10:46 AM

## 2018-04-03 NOTE — Anesthesia Postprocedure Evaluation (Signed)
Anesthesia Post Note  Patient: Gary Kramer  Procedure(s) Performed: TOTAL HIP ARTHROPLASTY (Right Hip)  Patient location during evaluation: Nursing Unit Anesthesia Type: Spinal Level of consciousness: oriented and awake and alert Pain management: pain level controlled Vital Signs Assessment: post-procedure vital signs reviewed and stable Respiratory status: spontaneous breathing and respiratory function stable Cardiovascular status: blood pressure returned to baseline and stable Postop Assessment: no headache, no backache and no apparent nausea or vomiting Anesthetic complications: no     Last Vitals:  Vitals:   04/02/18 2346 04/03/18 0402  BP: 113/63 98/67  Pulse: 74 84  Resp: 16   Temp: 36.9 C 36.9 C  SpO2: 98% 98%    Last Pain:  Vitals:   04/03/18 0746  TempSrc:   PainSc: 3                  Brantley Fling

## 2018-04-03 NOTE — Progress Notes (Signed)
Assessment done. Awake and sitting in chair. Pt states hes to to go home tomorrow. Denies needs at present. Call bell in reach.

## 2018-04-03 NOTE — Progress Notes (Signed)
  Subjective: 1 Day Post-Op Procedure(s) (LRB): TOTAL HIP ARTHROPLASTY (Right) Patient reports pain as mild.   Patient is well, and has had no acute complaints or problems Plan is to go Home after hospital stay. Negative for chest pain and shortness of breath Fever: no Gastrointestinal:Negative for nausea and vomiting  Objective: Vital signs in last 24 hours: Temp:  [96.1 F (35.6 C)-98.5 F (36.9 C)] 98.4 F (36.9 C) (05/08 0402) Pulse Rate:  [66-84] 84 (05/08 0402) Resp:  [12-21] 16 (05/07 2346) BP: (76-125)/(48-72) 98/67 (05/08 0402) SpO2:  [94 %-100 %] 98 % (05/08 0402) Weight:  [107.3 kg (236 lb 8.9 oz)] 107.3 kg (236 lb 8.9 oz) (05/07 1358)  Intake/Output from previous day:  Intake/Output Summary (Last 24 hours) at 04/03/2018 0800 Last data filed at 04/03/2018 0746 Gross per 24 hour  Intake 5450.25 ml  Output 1650 ml  Net 3800.25 ml    Intake/Output this shift: Total I/O In: 1090 [I.V.:1090] Out: -   Labs: Recent Labs    04/03/18 0456  HGB 10.3*   Recent Labs    04/03/18 0456  WBC 11.6*  RBC 3.46*  HCT 29.7*  PLT 261   Recent Labs    04/03/18 0456  NA 131*  K 3.8  CL 101  CO2 25  BUN 27*  CREATININE 1.66*  GLUCOSE 129*  CALCIUM 8.2*   No results for input(s): LABPT, INR in the last 72 hours.   EXAM General - Patient is Alert, Appropriate and Oriented Extremity - ABD soft Sensation intact distally Intact pulses distally Dorsiflexion/Plantar flexion intact Incision: dressing C/D/I No cellulitis present Dressing/Incision - clean, dry, no drainage Motor Function - intact, moving foot and toes well on exam.  Abdomen soft with normal BS.  Past Medical History:  Diagnosis Date  . Allergy   . Arthritis   . Depression   . Erectile dysfunction   . Hyperlipidemia   . Hypertension     Assessment/Plan: 1 Day Post-Op Procedure(s) (LRB): TOTAL HIP ARTHROPLASTY (Right) Active Problems:   Status post total hip replacement, right  Estimated  body mass index is 32.08 kg/m as calculated from the following:   Height as of this encounter: 6' (1.829 m).   Weight as of this encounter: 107.3 kg (236 lb 8.9 oz). Advance diet Up with therapy D/C IV fluids when tolerating po intake.  Labs reviewed, Cr 1.66, BUN 27.  Pt has history of elevated kidney labs, encouraged increased PO intake, will d/c IVF when tolerating. Na 131, will continue to monitor. Up with therapy today, currently recommending HHPT. Begin working on having a BM. Plan will be for discharge home tomorrow, if doing well this afternoon can possibly discharge later today.  DVT Prophylaxis - Foot Pumps, TED hose and Eliquis Weight-Bearing as tolerated to right leg  J. Cameron Proud, PA-C [facility] 04/03/2018, 8:00 AM

## 2018-04-03 NOTE — Care Management Note (Signed)
Case Management Note  Patient Details  Name: Gary Kramer MRN: [id number] Date of Birth: [birth year]  Subjective/Objective: POD # 1 right hip arthroplasty. Met with patient at bedside to discuss discharge planning. He lives alone but has family and neighbors that will be checking in on him. He has a walker and a bsc. Offered choice of home care providers. Referral to [facility] with [facility] for HHPT. He is on Eliquis. It is anticipated patient will discharge home tomorrow.                    Action/Plan: [facility] for HHPT.   Expected Discharge Date:                  Expected Discharge Plan:  [place]  In-House Referral:     Discharge planning Services  CM Consult  Post Acute Care Choice:  Home Health Choice offered to:  Patient  DME Arranged:    DME Agency:     HH Arranged:  PT [place]:  [place]  Status of Service:  In process, will continue to follow  If discussed at Long Length of Stay Meetings, dates discussed:    Additional Comments:  Jolly Mango, RN 04/03/2018, 3:02 PM

## 2018-04-04 ENCOUNTER — Encounter: Payer: Self-pay | Admitting: Surgery

## 2018-04-04 LAB — BASIC METABOLIC PANEL
Anion gap: 4 — ABNORMAL LOW (ref 5–15)
BUN: 27 mg/dL — AB (ref 6–20)
CALCIUM: 8.6 mg/dL — AB (ref 8.9–10.3)
CO2: 27 mmol/L (ref 22–32)
Chloride: 101 mmol/L (ref 101–111)
Creatinine, Ser: 1.72 mg/dL — ABNORMAL HIGH (ref 0.61–1.24)
GFR calc Af Amer: 45 mL/min — ABNORMAL LOW (ref >60)
GFR, EST NON AFRICAN AMERICAN: 39 mL/min — AB (ref >60)
GLUCOSE: 119 mg/dL — AB (ref 65–99)
Potassium: 4 mmol/L (ref 3.5–5.1)
Sodium: 132 mmol/L — ABNORMAL LOW (ref 135–145)

## 2018-04-04 LAB — CBC
HCT: 28.5 % — ABNORMAL LOW (ref 40.0–52.0)
Hemoglobin: 9.7 g/dL — ABNORMAL LOW (ref 13.0–18.0)
MCH: 29.3 pg (ref 26.0–34.0)
MCHC: 34.1 g/dL (ref 32.0–36.0)
MCV: 85.9 fL (ref 80.0–100.0)
PLATELETS: 264 10*3/uL (ref 150–440)
RBC: 3.32 MIL/uL — ABNORMAL LOW (ref 4.40–5.90)
RDW: 14.2 % (ref 11.5–14.5)
WBC: 11.3 10*3/uL — ABNORMAL HIGH (ref 3.8–10.6)

## 2018-04-04 LAB — SURGICAL PATHOLOGY

## 2018-04-04 MED ORDER — APIXABAN 2.5 MG PO TABS: 2.5000 mg | ORAL_TABLET | Freq: Two times a day (BID) | ORAL | 0 refills | Status: DC

## 2018-04-04 MED ORDER — OXYCODONE HCL 5 MG PO TABS: 5.0000 mg | ORAL_TABLET | ORAL | 0 refills | Status: DC | PRN

## 2018-04-04 NOTE — Care Management (Signed)
Eliquis coupon given 

## 2018-04-04 NOTE — Care Management Note (Signed)
Case Management Note  Patient Details  Name: Mikail Goostree MRN: [id number] Date of Birth: [birth year]  Subjective/Objective:  Discharging today                  Action/Plan: [facility] made aware of discharge.    Expected Discharge Date:  04/04/18               Expected Discharge Plan:  [place]  In-House Referral:     Discharge planning Services  CM Consult  Post Acute Care Choice:  Home Health Choice offered to:  Patient  DME Arranged:    DME Agency:     HH Arranged:  PT [place]:  [place]  Status of Service:  Completed, signed off  If discussed at [place] of Stay Meetings, dates discussed:    Additional Comments:  Jolly Mango, RN 04/04/2018, 11:04 AM

## 2018-04-04 NOTE — Progress Notes (Signed)
  Subjective: 2 Days Post-Op Procedure(s) (LRB): TOTAL HIP ARTHROPLASTY (Right) Patient reports pain as mild.   Patient is well, and has had no acute complaints or problems Plan is to go Home after hospital stay. Negative for chest pain and shortness of breath Fever: Mild fever last night. Gastrointestinal:Negative for nausea and vomiting  Objective: Vital signs in last 24 hours: Temp:  [98.7 F (37.1 C)-99.1 F (37.3 C)] 98.7 F (37.1 C) (05/09 0843) Pulse Rate:  [81-91] 81 (05/09 0843) Resp:  [18] 18 (05/09 0005) BP: (105-113)/(53-57) 113/53 (05/09 0843) SpO2:  [95 %-100 %] 100 % (05/09 0843)  Intake/Output from previous day:  Intake/Output Summary (Last 24 hours) at 04/04/2018 1020 Last data filed at 04/04/2018 0019 Gross per 24 hour  Intake 720 ml  Output 500 ml  Net 220 ml    Intake/Output this shift: No intake/output data recorded.  Labs: Recent Labs    04/03/18 0456 04/04/18 0758  HGB 10.3* 9.7*   Recent Labs    04/03/18 0456 04/04/18 0758  WBC 11.6* 11.3*  RBC 3.46* 3.32*  HCT 29.7* 28.5*  PLT 261 264   Recent Labs    04/03/18 0456 04/04/18 0454  NA 131* 132*  K 3.8 4.0  CL 101 101  CO2 25 27  BUN 27* 27*  CREATININE 1.66* 1.72*  GLUCOSE 129* 119*  CALCIUM 8.2* 8.6*   No results for input(s): LABPT, INR in the last 72 hours.   EXAM General - Patient is Alert, Appropriate and Oriented Extremity - ABD soft Sensation intact distally Intact pulses distally Dorsiflexion/Plantar flexion intact Incision: dressing C/D/I No cellulitis present Dressing/Incision - clean, dry, no drainage Motor Function - intact, moving foot and toes well on exam.  Abdomen soft with normal BS.  Past Medical History:  Diagnosis Date  . Allergy   . Arthritis   . Depression   . Erectile dysfunction   . Hyperlipidemia   . Hypertension     Assessment/Plan: 2 Days Post-Op Procedure(s) (LRB): TOTAL HIP ARTHROPLASTY (Right) Active Problems:   Status post  total hip replacement, right  Estimated body mass index is 32.08 kg/m as calculated from the following:   Height as of this encounter: 6' (1.829 m).   Weight as of this encounter: 107.3 kg (236 lb 8.9 oz). Advance diet Up with therapy D/C IV fluids when tolerating po intake.  Labs reviewed this AM, Cr and BUN remain elevated but patient has history of elevated Cr and BUN.  States that he is urinating well. Pt has done well with therapy, has cleared stairs. Pt has had a BM. Plan will be for discharge home this afternoon.  DVT Prophylaxis - Foot Pumps, TED hose and Eliquis Weight-Bearing as tolerated to right leg  J. Cameron Proud, PA-C [facility] 04/04/2018, 10:20 AM

## 2018-04-04 NOTE — Progress Notes (Signed)
Pt awake in chair. States he is more comfortable in chair than bed. Medicated for pain per mar. Call bell in reach.

## 2018-04-04 NOTE — Plan of Care (Signed)
  Problem: Education: Goal: Knowledge of General Education information will improve Outcome: Adequate for Discharge   Problem: Health Behavior/Discharge Planning: Goal: Ability to manage health-related needs will improve Outcome: Adequate for Discharge   Problem: Clinical Measurements: Goal: Ability to maintain clinical measurements within normal limits will improve Outcome: Adequate for Discharge Goal: Will remain free from infection Outcome: Adequate for Discharge Goal: Diagnostic test results will improve Outcome: Adequate for Discharge Goal: Respiratory complications will improve Outcome: Adequate for Discharge Goal: Cardiovascular complication will be avoided Outcome: Adequate for Discharge   Problem: Activity: Goal: Risk for activity intolerance will decrease Outcome: Adequate for Discharge   Problem: Nutrition: Goal: Adequate nutrition will be maintained Outcome: Adequate for Discharge   Problem: Coping: Goal: Level of anxiety will decrease Outcome: Adequate for Discharge   Problem: Elimination: Goal: Will not experience complications related to bowel motility Outcome: Adequate for Discharge Goal: Will not experience complications related to urinary retention Outcome: Adequate for Discharge   Problem: Pain Managment: Goal: General experience of comfort will improve Outcome: Adequate for Discharge   Problem: Safety: Goal: Ability to remain free from injury will improve Outcome: Adequate for Discharge   Problem: Skin Integrity: Goal: Risk for impaired skin integrity will decrease Outcome: Adequate for Discharge   Problem: Education: Goal: Knowledge of the prescribed therapeutic regimen will improve Outcome: Adequate for Discharge Goal: Understanding of discharge needs will improve Outcome: Adequate for Discharge   Problem: Activity: Goal: Ability to avoid complications of mobility impairment will improve Outcome: Adequate for Discharge Goal: Ability to  tolerate increased activity will improve Outcome: Adequate for Discharge   Problem: Clinical Measurements: Goal: Postoperative complications will be avoided or minimized Outcome: Adequate for Discharge   Problem: Pain Management: Goal: Pain level will decrease with appropriate interventions Outcome: Adequate for Discharge   Problem: Skin Integrity: Goal: Signs of wound healing will improve Outcome: Adequate for Discharge   

## 2018-04-04 NOTE — Discharge Instructions (Signed)
Instructions after Total Hip Replacement     J. Jeffrey Poggi, M.D.  J. Lance Jasiel Apachito, PA-C     Dept. of Orthopaedics & Sports Medicine  [facility]  [address]  [place], [place]  [address]  Phone: [phone]   Fax: [phone]    DIET: . Drink plenty of non-alcoholic fluids. . Resume your normal diet. Include foods high in fiber.  ACTIVITY:  . You may use crutches or a walker with weight-bearing as tolerated, unless instructed otherwise. . You may be weaned off of the walker or crutches by your Physical Therapist.  . Do NOT reach below the level of your knees or cross your legs until allowed.    . Continue doing gentle exercises. Exercising will reduce the pain and swelling, increase motion, and prevent muscle weakness.   . Please continue to use the TED compression stockings for 6 weeks. You may remove the stockings at night, but should reapply them in the morning. . Do not drive or operate any equipment until instructed.  WOUND CARE:  . Continue to use ice packs periodically to reduce pain and swelling. . Keep the incision clean and dry. . You may bathe or shower after the staples are removed at the first office visit following surgery.  MEDICATIONS: . You may resume your regular medications. . Please take the pain medication as prescribed on the medication. . Do not take pain medication on an empty stomach. . You have been given a prescription for a blood thinner to prevent blood clots. Please take the medication as instructed. (NOTE: After completing a 2 week course of Lovenox, take one Enteric-coated aspirin once a day.) . Pain medications and iron supplements can cause constipation. Use a stool softener (Senokot or Colace) on a daily basis and a laxative (dulcolax or miralax) as needed. . Do not drive or drink alcoholic beverages when taking pain medications.  CALL THE OFFICE FOR: . Temperature above 101 degrees . Excessive bleeding or drainage on the  dressing. . Excessive swelling, coldness, or paleness of the toes. . Persistent nausea and vomiting.  FOLLOW-UP:  . You should have an appointment to return to the office in 2 weeks after surgery. . Arrangements have been made for continuation of Physical Therapy (either home therapy or outpatient therapy).  

## 2018-04-04 NOTE — Progress Notes (Signed)
Physical Therapy Treatment Patient Details Name: Gary Kramer MRN: [id number] DOB: 25-Nov-1946 Today's Date: 04/04/2018    History of Present Illness 72 y/o male s/p R total hip replacement 5/7 (posterior approach).  Pt had L knee scope in February.    PT Comments    Pt in recliner, premedicated, anxious for discharge.  Participated in exercises as described below.  Ambulated to bathroom to void in standing.  One LOB noted but he was able to recover without assist.  He continued on to rehab gym for stair training after a short seated rest in gym.  Navigated without difficulty.  Returned to room with some increased soreness.  Pt with come general safety concerns that were reviewed during session.  They include hand placement, turning fully before sitting and ways to improve walking safety.  As pt fatigues speed increases.  Pt voiced understanding of all and stated he is "used to it" from prior knee surgery.  Has no further questions or concerns.    Of note, BP checked before session 98/57 but pt with no c/o dizziness.   Follow Up Recommendations  Home health PT     Equipment Recommendations  None recommended by PT    Recommendations for Other Services       Precautions / Restrictions Precautions Precautions: Posterior Hip;Fall Restrictions Weight Bearing Restrictions: Yes RLE Weight Bearing: Weight bearing as tolerated    Mobility  Bed Mobility               General bed mobility comments: in recliner  Transfers Overall transfer level: Needs assistance Equipment used: Rolling walker (2 wheeled) Transfers: Sit to/from Stand Sit to Stand: Supervision;Min guard         General transfer comment: pt did grab onto front of walker upon standing today.  education provided and he voiced understanding "I know"  Ambulation/Gait Ambulation/Gait assistance: Min guard;Supervision Ambulation Distance (Feet): 200 Feet Assistive device: Rolling walker (2 wheeled) Gait  Pattern/deviations: Step-through pattern;Decreased step length - right;Decreased step length - left;Trunk flexed   Gait velocity interpretation: 1.31 - 2.62 ft/sec, indicative of limited community ambulator General Gait Details: choppy steps with heavy reliance on walker, limited by pain/soreness   Stairs Stairs: Yes Stairs assistance: Min guard Stair Management: Two rails Number of Stairs: 4 General stair comments: navigated without difficulty   Wheelchair Mobility    Modified Rankin (Stroke Patients Only)       Balance Overall balance assessment: Modified Independent                                          Cognition Arousal/Alertness: Awake/alert Behavior During Therapy: WFL for tasks assessed/performed Overall Cognitive Status: Within Functional Limits for tasks assessed                                        Exercises Other Exercises Other Exercises: standing arom prior to gait for hip/knee flexion and SLR  Other Exercises: to commode to void in standing.  One LOB noted but pt caugth himself without assist.    General Comments        Pertinent Vitals/Pain Pain Assessment: Faces Pain Score: 4  Pain Location: r hip and l knee Pain Descriptors / Indicators: Sore Pain Intervention(s): Limited activity within patient's tolerance;Premedicated before session  Home Living                      Prior Function            PT Goals (current goals can now be found in the care plan section) Progress towards PT goals: Progressing toward goals    Frequency    BID      PT Plan Current plan remains appropriate    Co-evaluation              AM-PAC PT "6 Clicks" Daily Activity  Outcome Measure  Difficulty turning over in bed (including adjusting bedclothes, sheets and blankets)?: None Difficulty moving from lying on back to sitting on the side of the bed? : A Little Difficulty sitting down on and standing up  from a chair with arms (e.g., wheelchair, bedside commode, etc,.)?: A Little Help needed moving to and from a bed to chair (including a wheelchair)?: A Little Help needed walking in hospital room?: A Little Help needed climbing 3-5 steps with a railing? : A Little 6 Click Score: 19    End of Session Equipment Utilized During Treatment: Gait belt Activity Tolerance: Patient tolerated treatment well;Patient limited by pain Patient left: in chair;with chair alarm set;with call bell/phone within reach Nurse Communication: Mobility status;Other (comment) Pain - Right/Left: Right Pain - part of body: Hip     Time: 4888-9169 PT Time Calculation (min) (ACUTE ONLY): 17 min  Charges:  $Gait Training: 8-22 mins                    G Codes:       Chesley Noon, PTA 04/04/18, 10:52 AM

## 2018-04-04 NOTE — Progress Notes (Signed)
Pt has been in chair this shift. As requested. No distress, resp easy. Call bell in reach.

## 2018-04-04 NOTE — Discharge Summary (Signed)
Physician Discharge Summary  Patient ID: Gary Kramer MRN: [id number] DOB/AGE: 70-Jun-1947 70 y.o.  Admit date: 04/02/2018 Discharge date: 04/04/2018  Admission Diagnoses:  PRIMARY OSTEOARTHRITIS OF RIGHT HIP  Discharge Diagnoses: Patient Active Problem List   Diagnosis Date Noted  . Status post total hip replacement, right 04/02/2018  . Recurrent major depressive disorder, in partial remission ([place]) 01/16/2018  . Chronic allergic rhinitis 01/05/2017  . Essential hypertension 07/20/2015  . Hyperlipidemia 07/20/2015  . Erectile dysfunction 07/20/2015  . Depression 07/20/2015  . Allergic rhinitis due to pollen 07/20/2015  . Degenerative joint disease of hand 07/20/2015   Past Medical History:  Diagnosis Date  . Allergy   . Arthritis   . Depression   . Erectile dysfunction   . Hyperlipidemia   . Hypertension      Transfusion: None.   Consultants (if any):   Discharged Condition: Improved  Hospital Course: Gary Kramer is an 72 y.o. male who was admitted 04/02/2018 with a diagnosis of right hip osteoarthritis and went to the operating room on 04/02/2018 and underwent the above named procedures.    Surgeries: Procedure(s): TOTAL HIP ARTHROPLASTY on 04/02/2018 Patient tolerated the surgery well. Taken to PACU where she was stabilized and then transferred to the orthopedic floor.  Started on Eliquis 2.5mg  twice daily. Foot pumps applied bilaterally at 80 mm. Heels elevated on bed with rolled towels. No evidence of DVT. Negative Homan. Physical therapy started on day #1 for gait training and transfer. OT started day #1 for ADL and assisted devices.  Patient's IV was removed on POD1, and the patient's foley was removed following surgery.  Implants: Biomet press-fit system with a #12 laterally offset Echo femoral stem, a 56 mm acetabular shell with an E-poly hi-wall liner, and a 36 mm ceramic head with a +0 mm neck.  He was given perioperative antibiotics:   Anti-infectives (From admission, onward)   Start     Dose/Rate Route Frequency Ordered Stop   04/02/18 1400  clindamycin (CLEOCIN) IVPB 900 mg     900 mg 100 mL/hr over 30 Minutes Intravenous Every 6 hours 04/02/18 1237 04/03/18 0221   04/02/18 0552  clindamycin (CLEOCIN) 900 MG/50ML IVPB    Note to Pharmacy:  Phineas Real   : cabinet override      04/02/18 0552 04/02/18 0816   04/01/18 2200  clindamycin (CLEOCIN) IVPB 900 mg     900 mg 100 mL/hr over 30 Minutes Intravenous  Once 04/01/18 2159 04/02/18 0831    .  He was given sequential compression devices, early ambulation, and Eliquis for DVT prophylaxis.  He benefited maximally from the hospital stay and there were no complications.    Recent vital signs:  Vitals:   04/04/18 0005 04/04/18 0843  BP: (!) 112/57 (!) 113/53  Pulse: 91 81  Resp: 18   Temp: 99.1 F (37.3 C) 98.7 F (37.1 C)  SpO2: 95% 100%    Recent laboratory studies:  Lab Results  Component Value Date   HGB 9.7 (L) 04/04/2018   HGB 10.3 (L) 04/03/2018   HGB 13.8 03/20/2018   Lab Results  Component Value Date   WBC 11.3 (H) 04/04/2018   PLT 264 04/04/2018   Lab Results  Component Value Date   INR 0.93 03/20/2018   Lab Results  Component Value Date   NA 132 (L) 04/04/2018   K 4.0 04/04/2018   CL 101 04/04/2018   CO2 27 04/04/2018   BUN 27 (H) 04/04/2018   CREATININE  1.72 (H) 04/04/2018   GLUCOSE 119 (H) 04/04/2018    Discharge Medications:   Allergies as of 04/04/2018      Reactions   Cefaclor Itching   Penicillin V Potassium Rash   Has patient had a PCN reaction causing immediate rash, facial/tongue/throat swelling, SOB or lightheadedness with hypotension: No Has patient had a PCN reaction causing severe rash involving mucus membranes or skin necrosis: Unknown Has patient had a PCN reaction that required hospitalization: No Has patient had a PCN reaction occurring within the last 10 years: No If all of the above answers are "NO",  then may proceed with Cephalosporin use.      Medication List    STOP taking these medications   HYDROcodone-acetaminophen 5-325 MG tablet Commonly known as:  NORCO/VICODIN     TAKE these medications   amLODipine-benazepril 10-20 MG capsule Commonly known as:  LOTREL TAKE ONE (1) CAPSULE EACH DAY. What changed:    how much to take  how to take this  when to take this  additional instructions   apixaban 2.5 MG Tabs tablet Commonly known as:  ELIQUIS Take 1 tablet (2.5 mg total) by mouth 2 (two) times daily.   aspirin EC 81 MG tablet Take 81 mg by mouth daily.   atorvastatin 20 MG tablet Commonly known as:  LIPITOR Take 1 tablet (20 mg total) by mouth at bedtime. What changed:  when to take this   carvedilol 6.25 MG tablet Commonly known as:  COREG TAKE (1) TABLET BY MOUTH TWICE DAILY What changed:    how much to take  how to take this  when to take this  additional instructions   cholecalciferol 400 units Tabs tablet Commonly known as:  VITAMIN D Take 1,000 Units by mouth daily.   diclofenac 75 MG EC tablet Commonly known as:  VOLTAREN TAKE ONE (1) TABLET BY MOUTH ONCE DAILY   diclofenac sodium 1 % Gel Commonly known as:  VOLTAREN Apply 2 g topically 2 (two) times daily. What changed:    when to take this  reasons to take this   fenofibrate 145 MG tablet Commonly known as:  TRICOR TAKE 1 TABLET EVERY MORNING AT 6AM.   fluticasone 50 MCG/ACT nasal spray Commonly known as:  FLONASE Place 1 spray into both nostrils daily. What changed:    when to take this  reasons to take this   Garlic 546 MG Caps Take 1,000 mg by mouth daily.   hydrochlorothiazide 12.5 MG capsule Commonly known as:  MICROZIDE TAKE (1) CAPSULE BY MOUTH EVERY DAY AT 6AM   hydrochlorothiazide 12.5 MG capsule Commonly known as:  MICROZIDE TAKE 1 CAPSULE EVERY MORNING AT 6AM.   ibuprofen 200 MG tablet Commonly known as:  ADVIL,MOTRIN Take 400 mg by mouth every 8  (eight) hours as needed (for pain.).   loratadine 10 MG tablet Commonly known as:  CLARITIN Take 1 tablet (10 mg total) by mouth daily.   MULTI-VITAMINS Tabs Take 1 tablet by mouth daily at 6 (six) AM.   omega-3 acid ethyl esters 1 g capsule Commonly known as:  LOVAZA TAKE TWO CAPSULES TWICE DAILY. What changed:  See the new instructions.   oxyCODONE 5 MG immediate release tablet Commonly known as:  Oxy IR/ROXICODONE Take 1-2 tablets (5-10 mg total) by mouth every 4 (four) hours as needed for moderate pain.   sertraline 50 MG tablet Commonly known as:  ZOLOFT Take 1 tablet (50 mg total) by mouth daily at 6 (six) AM. What  changed:  how much to take   sildenafil 100 MG tablet Commonly known as:  VIAGRA Take 1 tablet (100 mg total) by mouth daily as needed for erectile dysfunction.   vitamin C 100 MG tablet Take 1,000 mg by mouth daily.            Durable Medical Equipment  (From admission, onward)        Start     Ordered   04/02/18 1238  DME Walker rolling  Once    Question:  Patient needs a walker to treat with the following condition  Answer:  Status post total hip replacement, right   04/02/18 1237   04/02/18 1238  DME 3 n 1  Once     04/02/18 1237   04/02/18 1238  DME Bedside commode  Once    Question:  Patient needs a bedside commode to treat with the following condition  Answer:  Status post total hip replacement, right   04/02/18 1237      Diagnostic Studies: Dg Chest 2 View  Result Date: 03/21/2018 CLINICAL DATA:  Right hip surgery. EXAM: CHEST - 2 VIEW COMPARISON:  01/24/2012. FINDINGS: Mediastinum hilar structures normal. Lungs are clear. Mild bibasilar subsegmental atelectasis. No pleural effusion or pneumothorax. Thoracic spine degenerative change. IMPRESSION: Mild bibasilar subsegmental atelectasis and/or scarring. Chest is stable from 01/24/2012. Electronically Signed   By: Marcello Moores  Register   On: 03/21/2018 08:05   Dg Hip Unilat W Or W/o Pelvis 2-3  Views Right  Result Date: 04/02/2018 CLINICAL DATA:  Status post right total hip joint prosthesis placement. EXAM: DG HIP (WITH OR WITHOUT PELVIS) 2-3V RIGHT COMPARISON:  Right femur series of June 25, 2017 FINDINGS: The patient has undergone right total hip joint prosthesis placement. Radiographic positioning of the prosthetic components appears good. The interface with the native bone is normal. There surgical skin staples present. IMPRESSION: No immediate postprocedure complication following right total hip joint prosthesis placement. Electronically Signed   By: David  Martinique M.D.   On: 04/02/2018 11:59   Disposition: Plan will be for discharge home following PT today.  Follow-up Information    Lattie Corns, PA-C Follow up in 14 day(s).   Specialty:  Physician Assistant Why:  Electa Sniff information: [place] [address] [phone]          Signed: Judson Roch PA-C 04/04/2018, 10:23 AM

## 2018-04-04 NOTE — Progress Notes (Signed)
Pt ready for discharge home with home health. Reviewed all discharge instructions including prescriptions and f/u appointments. Pt will have home health. Son transporting pt today

## 2018-04-05 DIAGNOSIS — E785 Hyperlipidemia, unspecified: Secondary | ICD-10-CM | POA: Diagnosis not present

## 2018-04-05 DIAGNOSIS — Z471 Aftercare following joint replacement surgery: Secondary | ICD-10-CM | POA: Diagnosis not present

## 2018-04-05 DIAGNOSIS — R69 Illness, unspecified: Secondary | ICD-10-CM | POA: Diagnosis not present

## 2018-04-05 DIAGNOSIS — M19049 Primary osteoarthritis, unspecified hand: Secondary | ICD-10-CM | POA: Diagnosis not present

## 2018-04-05 DIAGNOSIS — Z7902 Long term (current) use of antithrombotics/antiplatelets: Secondary | ICD-10-CM | POA: Diagnosis not present

## 2018-04-05 DIAGNOSIS — I10 Essential (primary) hypertension: Secondary | ICD-10-CM | POA: Diagnosis not present

## 2018-04-05 DIAGNOSIS — Z7951 Long term (current) use of inhaled steroids: Secondary | ICD-10-CM | POA: Diagnosis not present

## 2018-04-05 DIAGNOSIS — Z96641 Presence of right artificial hip joint: Secondary | ICD-10-CM | POA: Diagnosis not present

## 2018-04-05 DIAGNOSIS — Z7982 Long term (current) use of aspirin: Secondary | ICD-10-CM | POA: Diagnosis not present

## 2018-04-08 DIAGNOSIS — Z7901 Long term (current) use of anticoagulants: Secondary | ICD-10-CM | POA: Diagnosis not present

## 2018-04-08 DIAGNOSIS — J309 Allergic rhinitis, unspecified: Secondary | ICD-10-CM | POA: Diagnosis not present

## 2018-04-08 DIAGNOSIS — N529 Male erectile dysfunction, unspecified: Secondary | ICD-10-CM | POA: Diagnosis not present

## 2018-04-08 DIAGNOSIS — R69 Illness, unspecified: Secondary | ICD-10-CM | POA: Diagnosis not present

## 2018-04-08 DIAGNOSIS — E785 Hyperlipidemia, unspecified: Secondary | ICD-10-CM | POA: Diagnosis not present

## 2018-04-08 DIAGNOSIS — Z7982 Long term (current) use of aspirin: Secondary | ICD-10-CM | POA: Diagnosis not present

## 2018-04-08 DIAGNOSIS — Z791 Long term (current) use of non-steroidal anti-inflammatories (NSAID): Secondary | ICD-10-CM | POA: Diagnosis not present

## 2018-04-08 DIAGNOSIS — G8929 Other chronic pain: Secondary | ICD-10-CM | POA: Diagnosis not present

## 2018-04-08 DIAGNOSIS — M1990 Unspecified osteoarthritis, unspecified site: Secondary | ICD-10-CM | POA: Diagnosis not present

## 2018-04-08 DIAGNOSIS — I10 Essential (primary) hypertension: Secondary | ICD-10-CM | POA: Diagnosis not present

## 2018-04-09 DIAGNOSIS — E785 Hyperlipidemia, unspecified: Secondary | ICD-10-CM | POA: Diagnosis not present

## 2018-04-09 DIAGNOSIS — Z96641 Presence of right artificial hip joint: Secondary | ICD-10-CM | POA: Diagnosis not present

## 2018-04-09 DIAGNOSIS — M19049 Primary osteoarthritis, unspecified hand: Secondary | ICD-10-CM | POA: Diagnosis not present

## 2018-04-09 DIAGNOSIS — Z7902 Long term (current) use of antithrombotics/antiplatelets: Secondary | ICD-10-CM | POA: Diagnosis not present

## 2018-04-09 DIAGNOSIS — I10 Essential (primary) hypertension: Secondary | ICD-10-CM | POA: Diagnosis not present

## 2018-04-09 DIAGNOSIS — Z7982 Long term (current) use of aspirin: Secondary | ICD-10-CM | POA: Diagnosis not present

## 2018-04-09 DIAGNOSIS — Z471 Aftercare following joint replacement surgery: Secondary | ICD-10-CM | POA: Diagnosis not present

## 2018-04-09 DIAGNOSIS — R69 Illness, unspecified: Secondary | ICD-10-CM | POA: Diagnosis not present

## 2018-04-09 DIAGNOSIS — Z7951 Long term (current) use of inhaled steroids: Secondary | ICD-10-CM | POA: Diagnosis not present

## 2018-04-11 DIAGNOSIS — Z7951 Long term (current) use of inhaled steroids: Secondary | ICD-10-CM | POA: Diagnosis not present

## 2018-04-11 DIAGNOSIS — Z7902 Long term (current) use of antithrombotics/antiplatelets: Secondary | ICD-10-CM | POA: Diagnosis not present

## 2018-04-11 DIAGNOSIS — M19049 Primary osteoarthritis, unspecified hand: Secondary | ICD-10-CM | POA: Diagnosis not present

## 2018-04-11 DIAGNOSIS — R69 Illness, unspecified: Secondary | ICD-10-CM | POA: Diagnosis not present

## 2018-04-11 DIAGNOSIS — I10 Essential (primary) hypertension: Secondary | ICD-10-CM | POA: Diagnosis not present

## 2018-04-11 DIAGNOSIS — Z471 Aftercare following joint replacement surgery: Secondary | ICD-10-CM | POA: Diagnosis not present

## 2018-04-11 DIAGNOSIS — E785 Hyperlipidemia, unspecified: Secondary | ICD-10-CM | POA: Diagnosis not present

## 2018-04-11 DIAGNOSIS — Z7982 Long term (current) use of aspirin: Secondary | ICD-10-CM | POA: Diagnosis not present

## 2018-04-11 DIAGNOSIS — Z96641 Presence of right artificial hip joint: Secondary | ICD-10-CM | POA: Diagnosis not present

## 2018-04-18 DIAGNOSIS — Z471 Aftercare following joint replacement surgery: Secondary | ICD-10-CM | POA: Diagnosis not present

## 2018-05-15 DIAGNOSIS — Z96641 Presence of right artificial hip joint: Secondary | ICD-10-CM | POA: Diagnosis not present

## 2018-05-22 ENCOUNTER — Other Ambulatory Visit: Payer: Self-pay | Admitting: Family Medicine

## 2018-05-22 DIAGNOSIS — I10 Essential (primary) hypertension: Secondary | ICD-10-CM

## 2018-06-06 DIAGNOSIS — Z96652 Presence of left artificial knee joint: Secondary | ICD-10-CM | POA: Diagnosis not present

## 2018-06-06 DIAGNOSIS — E669 Obesity, unspecified: Secondary | ICD-10-CM | POA: Diagnosis not present

## 2018-06-18 DIAGNOSIS — R69 Illness, unspecified: Secondary | ICD-10-CM | POA: Diagnosis not present

## 2018-07-01 DIAGNOSIS — D2230 Melanocytic nevi of unspecified part of face: Secondary | ICD-10-CM | POA: Diagnosis not present

## 2018-07-01 DIAGNOSIS — D225 Melanocytic nevi of trunk: Secondary | ICD-10-CM | POA: Diagnosis not present

## 2018-07-01 DIAGNOSIS — D1800 Hemangioma unspecified site: Secondary | ICD-10-CM | POA: Diagnosis not present

## 2018-07-01 DIAGNOSIS — L821 Other seborrheic keratosis: Secondary | ICD-10-CM | POA: Diagnosis not present

## 2018-07-01 DIAGNOSIS — L820 Inflamed seborrheic keratosis: Secondary | ICD-10-CM | POA: Diagnosis not present

## 2018-07-01 DIAGNOSIS — Z1283 Encounter for screening for malignant neoplasm of skin: Secondary | ICD-10-CM | POA: Diagnosis not present

## 2018-07-01 DIAGNOSIS — L812 Freckles: Secondary | ICD-10-CM | POA: Diagnosis not present

## 2018-07-01 DIAGNOSIS — I788 Other diseases of capillaries: Secondary | ICD-10-CM | POA: Diagnosis not present

## 2018-07-01 DIAGNOSIS — D2260 Melanocytic nevi of unspecified upper limb, including shoulder: Secondary | ICD-10-CM | POA: Diagnosis not present

## 2018-07-01 DIAGNOSIS — D2270 Melanocytic nevi of unspecified lower limb, including hip: Secondary | ICD-10-CM | POA: Diagnosis not present

## 2018-07-10 ENCOUNTER — Other Ambulatory Visit: Payer: Self-pay | Admitting: Family Medicine

## 2018-07-10 DIAGNOSIS — M19042 Primary osteoarthritis, left hand: Principal | ICD-10-CM

## 2018-07-10 DIAGNOSIS — M19041 Primary osteoarthritis, right hand: Secondary | ICD-10-CM

## 2018-07-16 ENCOUNTER — Encounter: Payer: Self-pay | Admitting: Family Medicine

## 2018-07-16 ENCOUNTER — Ambulatory Visit (INDEPENDENT_AMBULATORY_CARE_PROVIDER_SITE_OTHER): Payer: Medicare HMO | Admitting: Family Medicine

## 2018-07-16 DIAGNOSIS — E785 Hyperlipidemia, unspecified: Secondary | ICD-10-CM

## 2018-07-16 DIAGNOSIS — F3341 Major depressive disorder, recurrent, in partial remission: Secondary | ICD-10-CM

## 2018-07-16 DIAGNOSIS — M19041 Primary osteoarthritis, right hand: Secondary | ICD-10-CM

## 2018-07-16 DIAGNOSIS — I10 Essential (primary) hypertension: Secondary | ICD-10-CM | POA: Diagnosis not present

## 2018-07-16 DIAGNOSIS — M19042 Primary osteoarthritis, left hand: Secondary | ICD-10-CM

## 2018-07-16 DIAGNOSIS — J301 Allergic rhinitis due to pollen: Secondary | ICD-10-CM | POA: Diagnosis not present

## 2018-07-16 DIAGNOSIS — R69 Illness, unspecified: Secondary | ICD-10-CM | POA: Diagnosis not present

## 2018-07-16 MED ORDER — SERTRALINE HCL 50 MG PO TABS
50.0000 mg | ORAL_TABLET | Freq: Every day | ORAL | 5 refills | Status: DC
Start: 2018-07-16 — End: 2019-01-16

## 2018-07-16 MED ORDER — DICLOFENAC SODIUM 75 MG PO TBEC: DELAYED_RELEASE_TABLET | ORAL | 1 refills | Status: DC

## 2018-07-16 MED ORDER — CARVEDILOL 6.25 MG PO TABS: ORAL_TABLET | ORAL | 5 refills | Status: DC

## 2018-07-16 MED ORDER — HYDROCHLOROTHIAZIDE 12.5 MG PO CAPS: ORAL_CAPSULE | ORAL | 5 refills | Status: DC

## 2018-07-16 MED ORDER — FLUTICASONE PROPIONATE 50 MCG/ACT NA SUSP: 1.0000 | Freq: Every day | NASAL | 1 refills | Status: DC | PRN

## 2018-07-16 MED ORDER — DICLOFENAC SODIUM 1 % TD GEL: 2.0000 g | Freq: Two times a day (BID) | TRANSDERMAL | 2 refills | Status: DC | PRN

## 2018-07-16 MED ORDER — AMLODIPINE BESY-BENAZEPRIL HCL 10-20 MG PO CAPS: ORAL_CAPSULE | ORAL | 5 refills | Status: DC

## 2018-07-16 MED ORDER — ATORVASTATIN CALCIUM 20 MG PO TABS: 20.0000 mg | ORAL_TABLET | Freq: Every day | ORAL | 5 refills | Status: DC

## 2018-07-16 MED ORDER — FENOFIBRATE 145 MG PO TABS: ORAL_TABLET | ORAL | 5 refills | Status: DC

## 2018-07-16 NOTE — Assessment & Plan Note (Signed)
Chronic controlled Recently switched lovasa to omega 3. Continue fenofibrate 145 mg daily and atorvastatin 20 mg daily Will check lipid panel.

## 2018-07-16 NOTE — Progress Notes (Signed)
Name: Gary Kramer   MRN: [id number]    DOB: 01/31/1946   Date:07/16/2018       Progress Note  Subjective  Chief Complaint  Chief Complaint  Patient presents with  . Hypertension  . Hyperlipidemia  . Depression  . Allergic Rhinitis     Hypertension  This is a chronic problem. The current episode started more than 1 year ago. The problem is unchanged. The problem is controlled. Pertinent negatives include no anxiety, blurred vision, chest pain, headaches, malaise/fatigue, neck pain, orthopnea, palpitations, peripheral edema, PND, shortness of breath or sweats. There are no associated agents to hypertension. Risk factors for coronary artery disease include dyslipidemia, diabetes mellitus and post-menopausal state. Past treatments include ACE inhibitors, calcium channel blockers, diuretics and beta blockers. The current treatment provides moderate improvement. There are no compliance problems.  There is no history of angina, kidney disease, CAD/MI, CVA, heart failure, left ventricular hypertrophy, PVD or retinopathy. There is no history of chronic renal disease, a hypertension causing med or renovascular disease.  Hyperlipidemia  This is a chronic problem. The current episode started more than 1 year ago. The problem is controlled. Recent lipid tests were reviewed and are normal. He has no history of chronic renal disease, diabetes, hypothyroidism, liver disease, obesity or nephrotic syndrome. There are no known factors aggravating his hyperlipidemia. Pertinent negatives include no chest pain, focal sensory loss, focal weakness, leg pain, myalgias or shortness of breath. Current antihyperlipidemic treatment includes statins and fibric acid derivatives. The current treatment provides moderate improvement of lipids. There are no compliance problems.  Risk factors for coronary artery disease include hypertension and dyslipidemia.  Depression         The patient presents with no depression.  This is  a chronic problem.  The current episode started more than 1 year ago.   The onset quality is gradual.   The problem occurs daily.The problem is unchanged.  Associated symptoms include no decreased concentration, no fatigue, no helplessness, no hopelessness, does not have insomnia, not irritable, no restlessness, no decreased interest, no appetite change, no body aches, no myalgias, no headaches, no indigestion, not sad and no suicidal ideas.  Past treatments include SSRIs - Selective serotonin reuptake inhibitors.  Compliance with treatment is good.   Pertinent negatives include no hypothyroidism, no anxiety and no depression. Arthritis  Presents for follow-up visit. He complains of stiffness. The symptoms have been improving. Affected locations include the left DIP, right PIP, left MCP, left PIP, right DIP and right MCP. His pain is at a severity of 5/10. Pertinent negatives include no diarrhea, dry eyes, dry mouth, dysuria, fatigue, fever, pain while resting, rash, uveitis or weight loss.  URI   This is a chronic (allergic rhinitis) problem. The current episode started more than 1 year ago. The problem has been waxing and waning. Associated symptoms include congestion and rhinorrhea. Pertinent negatives include no abdominal pain, chest pain, coughing, diarrhea, dysuria, ear pain, headaches, joint pain, nausea, neck pain, rash, sore throat or wheezing.    Essential hypertension Chronic Controlled Continue Lotrel 10-20 daily ,Coreg 6.25 bid , and HCTZ 12.5 mg daily. Will check renal panel.   Allergic rhinitis due to pollen Recurrent seasonal Cont fluticasone prn.  Degenerative joint disease of hand Chronic relative control on medications. Continue etodolac bid and voltaren gel.  Hyperlipidemia Chronic controlled Recently switched lovasa to omega 3. Continue fenofibrate 145 mg daily and atorvastatin 20 mg daily Will check lipid panel.  Recurrent major depressive disorder, in  partial remission  ([facility]) Chronic Stable Continue sertraline 50 mg daily.    Past Medical History:  Diagnosis Date  . Allergy   . Arthritis   . Depression   . Erectile dysfunction   . Hyperlipidemia   . Hypertension     Past Surgical History:  Procedure Laterality Date  . COLONOSCOPY  2016   cleared for 5 yrs- Dr Candace Cruise  . HERNIA REPAIR    . 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   Procedure: TOTAL HIP ARTHROPLASTY;  Surgeon: Corky Mull, MD;  Location: [facility] ORS;  Service: Orthopedics;  Laterality: Right;    Family History  Problem Relation Age of Onset  . Cancer Mother        brain  . Stroke Father     Social History   Socioeconomic History  . Marital status: Widowed    Spouse name: Not on file  . Number of children: 2  . Years of education: Not on file  . Highest education level: Bachelor's degree (e.g., BA, AB, BS)  Occupational History  . Occupation: Retired  Scientific laboratory technician  . Financial resource strain: Not hard at all  . Food insecurity:    Worry: Never true    Inability: Never true  . Transportation needs:    Medical: No    Non-medical: No  Tobacco Use  . Smoking status: Former Smoker    Packs/day: 1.50    Years: 30.00    Pack years: 45.00    Types: Cigarettes    Last attempt to quit: 1996    Years since quitting: 23.6  . Smokeless tobacco: Never Used  . Tobacco comment: smoking cessatioinn materials not required  Substance and Sexual Activity  . Alcohol use: No    Alcohol/week: 0.0 standard drinks    Frequency: Never  . Drug use: No  . Sexual activity: Not Currently  Lifestyle  . Physical activity:    Days per week: 0 days    Minutes per session: 0 min  . Stress: Not at all  Relationships  . Social connections:    Talks on phone: Patient refused    Gets together: Patient refused    Attends religious service: Patient refused    Active member of club or organization: Patient refused    Attends meetings of clubs or  organizations: Patient refused    Relationship status: Widowed  . Intimate partner violence:    Fear of current or ex partner: No    Emotionally abused: No    Physically abused: No    Forced sexual activity: No  Other Topics Concern  . Not on file  Social History Narrative  . Not on file    Allergies  Allergen Reactions  . Cefaclor Itching  . Penicillin V Potassium Rash    Has patient had a PCN reaction causing immediate rash, facial/tongue/throat swelling, SOB or lightheadedness with hypotension: No Has patient had a PCN reaction causing severe rash involving mucus membranes or skin necrosis: Unknown Has patient had a PCN reaction that required hospitalization: No Has patient had a PCN reaction occurring within the last 10 years: No If all of the above answers are "NO", then may proceed with Cephalosporin use.     Outpatient Medications Prior to Visit  Medication Sig Dispense Refill  . Ascorbic Acid (VITAMIN C) 100 MG tablet Take 1,000 mg by mouth daily.     Marland Kitchen aspirin EC 81 MG tablet Take 81 mg by  mouth daily.     . cholecalciferol (VITAMIN D) 400 units TABS tablet Take 1,000 Units by mouth daily.     . Garlic 671 MG CAPS Take 1,000 mg by mouth daily.     Marland Kitchen loratadine (CLARITIN) 10 MG tablet Take 1 tablet (10 mg total) by mouth daily. 90 tablet 3  . Multiple Vitamin (MULTI-VITAMINS) TABS Take 1 tablet by mouth daily at 6 (six) AM.    . omega-3 acid ethyl esters (LOVAZA) 1 g capsule TAKE TWO CAPSULES TWICE DAILY. (Patient taking differently: Take 1 g by mouth daily. otc) 120 capsule 1  . amLODipine-benazepril (LOTREL) 10-20 MG capsule TAKE ONE (1) CAPSULE EACH DAY. (Patient taking differently: Take 1 capsule by mouth daily. TAKE ONE (1) CAPSULE EACH DAY.) 30 capsule 6  . atorvastatin (LIPITOR) 20 MG tablet Take 1 tablet (20 mg total) by mouth at bedtime. (Patient taking differently: Take 20 mg by mouth daily. ) 30 tablet 6  . carvedilol (COREG) 6.25 MG tablet TAKE (1) TABLET BY  MOUTH TWICE DAILY 60 tablet 1  . diclofenac (VOLTAREN) 75 MG EC tablet TAKE (1) TABLET BY MOUTH EVERY DAY 30 tablet 0  . diclofenac sodium (VOLTAREN) 1 % GEL Apply 2 g topically 2 (two) times daily. (Patient taking differently: Apply 2 g topically 2 (two) times daily as needed (for pain in hands). ) 100 g 0  . fenofibrate (TRICOR) 145 MG tablet TAKE 1 TABLET EVERY MORNING AT 6AM. 30 tablet 1  . fluticasone (FLONASE) 50 MCG/ACT nasal spray Place 1 spray into both nostrils daily. (Patient taking differently: Place 1 spray into both nostrils daily as needed (for sinus/allergy issues.). ) 1 g 11  . hydrochlorothiazide (MICROZIDE) 12.5 MG capsule TAKE (1) CAPSULE BY MOUTH EVERY DAY AT 6AM 30 capsule 6  . sertraline (ZOLOFT) 50 MG tablet Take 1 tablet (50 mg total) by mouth daily at 6 (six) AM. (Patient taking differently: Take 25 mg by mouth daily at 6 (six) AM. ) 30 tablet 6  . sildenafil (VIAGRA) 100 MG tablet Take 1 tablet (100 mg total) by mouth daily as needed for erectile dysfunction. (Patient not taking: Reported on 03/14/2018) 10 tablet 5  . apixaban (ELIQUIS) 2.5 MG TABS tablet Take 1 tablet (2.5 mg total) by mouth 2 (two) times daily. 40 tablet 0  . carvedilol (COREG) 6.25 MG tablet TAKE (1) TABLET BY MOUTH TWICE DAILY (Patient taking differently: Take 6.25 mg by mouth 2 (two) times daily. ) 60 tablet 6  . hydrochlorothiazide (MICROZIDE) 12.5 MG capsule TAKE 1 CAPSULE EVERY MORNING AT 6AM. 30 capsule 0  . ibuprofen (ADVIL,MOTRIN) 200 MG tablet Take 400 mg by mouth every 8 (eight) hours as needed (for pain.).    Marland Kitchen oxyCODONE (OXY IR/ROXICODONE) 5 MG immediate release tablet Take 1-2 tablets (5-10 mg total) by mouth every 4 (four) hours as needed for moderate pain. 60 tablet 0   No facility-administered medications prior to visit.     Review of Systems  Constitutional: Negative for appetite change, chills, fatigue, fever, malaise/fatigue and weight loss.  HENT: Positive for congestion and  rhinorrhea. Negative for ear discharge, ear pain and sore throat.   Eyes: Negative for blurred vision.  Respiratory: Negative for cough, sputum production, shortness of breath and wheezing.   Cardiovascular: Negative for chest pain, palpitations, orthopnea, leg swelling and PND.  Gastrointestinal: Negative for abdominal pain, blood in stool, constipation, diarrhea, heartburn, melena and nausea.  Genitourinary: Negative for dysuria, frequency, hematuria and urgency.  Musculoskeletal: Positive  for arthritis and stiffness. Negative for back pain, joint pain, myalgias and neck pain.  Skin: Negative for rash.  Neurological: Negative for dizziness, tingling, sensory change, focal weakness and headaches.  Endo/Heme/Allergies: Negative for environmental allergies and polydipsia. Does not bruise/bleed easily.  Psychiatric/Behavioral: Positive for depression. Negative for decreased concentration and suicidal ideas. The patient is not nervous/anxious and does not have insomnia.      Objective  Vitals:   07/16/18 1326  BP: 120/76  Pulse: 72  Weight: 232 lb (105.2 kg)  Height: 6' (1.829 m)    Physical Exam  Constitutional: He is oriented to person, place, and time. He is not irritable.  HENT:  Head: Normocephalic.  Right Ear: External ear normal.  Left Ear: External ear normal.  Nose: Nose normal.  Mouth/Throat: Oropharynx is clear and moist.  Eyes: Pupils are equal, round, and reactive to light. Conjunctivae and EOM are normal. Right eye exhibits no discharge. Left eye exhibits no discharge. No scleral icterus.  Neck: Normal range of motion. Neck supple. No JVD present. No tracheal deviation present. No thyromegaly present.  Cardiovascular: Normal rate, regular rhythm, normal heart sounds and intact distal pulses. Exam reveals no gallop and no friction rub.  No murmur heard. Pulmonary/Chest: Breath sounds normal. No respiratory distress. He has no wheezes. He has no rales.  Abdominal: Soft.  Bowel sounds are normal. He exhibits no mass. There is no hepatosplenomegaly. There is no tenderness. There is no rebound, no guarding and no CVA tenderness.  Musculoskeletal: Normal range of motion. He exhibits no edema or tenderness.  Lymphadenopathy:    He has no cervical adenopathy.  Neurological: He is alert and oriented to person, place, and time. He has normal strength and normal reflexes. No cranial nerve deficit.  Skin: Skin is warm. No rash noted.  Nursing note and vitals reviewed.     Assessment & Plan  Problem List Items Addressed This Visit      Cardiovascular and Mediastinum   Essential hypertension    Chronic Controlled Continue Lotrel 10-20 daily ,Coreg 6.25 bid , and HCTZ 12.5 mg daily. Will check renal panel.       Relevant Medications   amLODipine-benazepril (LOTREL) 10-20 MG capsule   atorvastatin (LIPITOR) 20 MG tablet   carvedilol (COREG) 6.25 MG tablet   fenofibrate (TRICOR) 145 MG tablet   hydrochlorothiazide (MICROZIDE) 12.5 MG capsule   Other Relevant Orders   Renal Function Panel     Respiratory   Allergic rhinitis due to pollen    Recurrent seasonal Cont fluticasone prn.      Relevant Medications   fluticasone (FLONASE) 50 MCG/ACT nasal spray     Musculoskeletal and Integument   Degenerative joint disease of hand    Chronic relative control on medications. Continue etodolac bid and voltaren gel.      Relevant Medications   diclofenac (VOLTAREN) 75 MG EC tablet   diclofenac sodium (VOLTAREN) 1 % GEL     Other   Hyperlipidemia    Chronic controlled Recently switched lovasa to omega 3. Continue fenofibrate 145 mg daily and atorvastatin 20 mg daily Will check lipid panel.      Relevant Medications   amLODipine-benazepril (LOTREL) 10-20 MG capsule   atorvastatin (LIPITOR) 20 MG tablet   carvedilol (COREG) 6.25 MG tablet   fenofibrate (TRICOR) 145 MG tablet   hydrochlorothiazide (MICROZIDE) 12.5 MG capsule   Other Relevant Orders   Lipid  panel   Recurrent major depressive disorder, in partial remission ([place])  Chronic Stable Continue sertraline 50 mg daily.       Relevant Medications   sertraline (ZOLOFT) 50 MG tablet    20 mg daily. Will check   Meds ordered this encounter  Medications  . amLODipine-benazepril (LOTREL) 10-20 MG capsule    Sig: TAKE ONE (1) CAPSULE EACH DAY.    Dispense:  30 capsule    Refill:  5  . atorvastatin (LIPITOR) 20 MG tablet    Sig: Take 1 tablet (20 mg total) by mouth daily.    Dispense:  30 tablet    Refill:  5  . carvedilol (COREG) 6.25 MG tablet    Sig: TAKE (1) TABLET BY MOUTH TWICE DAILY    Dispense:  60 tablet    Refill:  5  . diclofenac (VOLTAREN) 75 MG EC tablet    Sig: TAKE (1) TABLET BY MOUTH EVERY DAY    Dispense:  30 tablet    Refill:  1  . diclofenac sodium (VOLTAREN) 1 % GEL    Sig: Apply 2 g topically 2 (two) times daily as needed (for pain in hands).    Dispense:  30 g    Refill:  2  . fenofibrate (TRICOR) 145 MG tablet    Sig: TAKE 1 TABLET EVERY MORNING AT 6AM.    Dispense:  30 tablet    Refill:  5  . fluticasone (FLONASE) 50 MCG/ACT nasal spray    Sig: Place 1 spray into both nostrils daily as needed (for sinus/allergy issues.).    Dispense:  16 g    Refill:  1  . hydrochlorothiazide (MICROZIDE) 12.5 MG capsule    Sig: TAKE (1) CAPSULE BY MOUTH EVERY DAY AT 6AM    Dispense:  30 capsule    Refill:  5  . sertraline (ZOLOFT) 50 MG tablet    Sig: Take 1 tablet (50 mg total) by mouth daily at 6 (six) AM.    Dispense:  30 tablet    Refill:  5      Dr. Rida Loudin [facility]  07/16/18

## 2018-07-16 NOTE — Assessment & Plan Note (Signed)
Chronic relative control on medications. Continue etodolac bid and voltaren gel.

## 2018-07-16 NOTE — Assessment & Plan Note (Addendum)
Chronic Controlled Continue Lotrel 10-20 daily ,Coreg 6.25 bid , and HCTZ 12.5 mg daily. Will check renal panel.

## 2018-07-16 NOTE — Assessment & Plan Note (Signed)
Chronic Stable Continue sertraline 50 mg daily.

## 2018-07-16 NOTE — Assessment & Plan Note (Signed)
Recurrent seasonal Cont fluticasone prn.

## 2018-07-17 LAB — RENAL FUNCTION PANEL
Albumin: 4.6 g/dL (ref 3.5–4.8)
BUN / CREAT RATIO: 16 (ref 10–24)
BUN: 22 mg/dL (ref 8–27)
CALCIUM: 10.1 mg/dL (ref 8.6–10.2)
CHLORIDE: 105 mmol/L (ref 96–106)
CO2: 20 mmol/L (ref 20–29)
Creatinine, Ser: 1.39 mg/dL — ABNORMAL HIGH (ref 0.76–1.27)
GFR calc non Af Amer: 51 mL/min/{1.73_m2} — ABNORMAL LOW (ref >59)
GFR, EST AFRICAN AMERICAN: 59 mL/min/{1.73_m2} — AB (ref >59)
GLUCOSE: 105 mg/dL — AB (ref 65–99)
POTASSIUM: 4.6 mmol/L (ref 3.5–5.2)
Phosphorus: 3.7 mg/dL (ref 2.5–4.5)
SODIUM: 143 mmol/L (ref 134–144)

## 2018-07-17 LAB — LIPID PANEL
CHOLESTEROL TOTAL: 156 mg/dL (ref 100–199)
Chol/HDL Ratio: 4.1 ratio (ref 0.0–5.0)
HDL: 38 mg/dL — ABNORMAL LOW (ref >39)
LDL Calculated: 90 mg/dL (ref 0–99)
Triglycerides: 142 mg/dL (ref 0–149)
VLDL Cholesterol Cal: 28 mg/dL (ref 5–40)

## 2018-08-05 ENCOUNTER — Ambulatory Visit (INDEPENDENT_AMBULATORY_CARE_PROVIDER_SITE_OTHER): Payer: Medicare HMO

## 2018-08-05 DIAGNOSIS — Z23 Encounter for immunization: Secondary | ICD-10-CM

## 2018-09-24 ENCOUNTER — Other Ambulatory Visit: Payer: Self-pay | Admitting: Family Medicine

## 2018-09-24 DIAGNOSIS — F3341 Major depressive disorder, recurrent, in partial remission: Secondary | ICD-10-CM

## 2018-10-02 DIAGNOSIS — M1611 Unilateral primary osteoarthritis, right hip: Secondary | ICD-10-CM | POA: Diagnosis not present

## 2018-10-02 DIAGNOSIS — Z96641 Presence of right artificial hip joint: Secondary | ICD-10-CM | POA: Diagnosis not present

## 2018-10-08 DIAGNOSIS — M25562 Pain in left knee: Secondary | ICD-10-CM | POA: Diagnosis not present

## 2018-10-08 DIAGNOSIS — Z96652 Presence of left artificial knee joint: Secondary | ICD-10-CM | POA: Diagnosis not present

## 2018-11-04 ENCOUNTER — Other Ambulatory Visit: Payer: Self-pay | Admitting: Family Medicine

## 2018-11-04 DIAGNOSIS — M19041 Primary osteoarthritis, right hand: Secondary | ICD-10-CM

## 2018-11-04 DIAGNOSIS — M19042 Primary osteoarthritis, left hand: Principal | ICD-10-CM

## 2018-11-23 ENCOUNTER — Other Ambulatory Visit: Payer: Self-pay | Admitting: Family Medicine

## 2018-12-23 DIAGNOSIS — R69 Illness, unspecified: Secondary | ICD-10-CM | POA: Diagnosis not present

## 2019-01-08 ENCOUNTER — Ambulatory Visit (INDEPENDENT_AMBULATORY_CARE_PROVIDER_SITE_OTHER): Payer: Medicare HMO

## 2019-01-08 VITALS — BP 120/70 | HR 67 | Temp 98.0°F | Resp 16 | Ht 72.0 in | Wt 241.0 lb

## 2019-01-08 DIAGNOSIS — Z1159 Encounter for screening for other viral diseases: Secondary | ICD-10-CM

## 2019-01-08 DIAGNOSIS — Z0000 Encounter for general adult medical examination without abnormal findings: Secondary | ICD-10-CM | POA: Diagnosis not present

## 2019-01-08 NOTE — Progress Notes (Signed)
Subjective:   Gary Kramer is a 73 y.o. male who presents for Medicare Annual/Subsequent preventive examination.  Review of Systems:   Cardiac Risk Factors include: advanced age (>26men, >14 women);male gender;dyslipidemia;hypertension;obesity (BMI >30kg/m2)     Objective:    Vitals: BP 120/70 (BP Location: Right Arm, Patient Position: Sitting, Cuff Size: Large)   Pulse 67   Temp 98 F (36.7 C) (Oral)   Resp 16   Ht 6' (1.829 m)   Wt 241 lb (109.3 kg)   SpO2 94%   BMI 32.69 kg/m   Body mass index is 32.69 kg/m.  Advanced Directives 01/08/2019 04/02/2018 03/20/2018 01/07/2018  Does Patient Have a Medical Advance Directive? Yes Yes Yes No  Type of Paramedic of [place];Living will [place];Living will [place];Living will -  Does patient want to make changes to medical advance directive? - No - Patient declined - -  Copy of [place] in Chart? Yes - validated most recent copy scanned in chart (See row information) Yes No - copy requested -  Would patient like information on creating a medical advance directive? - - - Yes (MAU/Ambulatory/Procedural Areas - Information given)    Tobacco Social History   Tobacco Use  Smoking Status Former Smoker  . Packs/day: 1.50  . Years: 30.00  . Pack years: 45.00  . Types: Cigarettes  . Last attempt to quit: 1996  . Years since quitting: 24.1  Smokeless Tobacco Never Used  Tobacco Comment   smoking cessatioinn materials not required     Counseling given: Not Answered Comment: smoking cessatioinn materials not required   Clinical Intake:  Pre-visit preparation completed: Yes  Pain : No/denies pain     BMI - recorded: 32.69 Nutritional Risks: None Diabetes: No  How often do you need to have someone help you when you read instructions, pamphlets, or other written materials from your doctor or pharmacy?: 1 - Never What is the last grade  level you completed in school?: bachelor's degree  Interpreter Needed?: No  Information entered by :: Clemetine Marker LPN  Past Medical History:  Diagnosis Date  . Allergy   . Arthritis   . Depression   . Erectile dysfunction   . Hyperlipidemia   . Hypertension    Past Surgical History:  Procedure Laterality Date  . COLONOSCOPY  2016   cleared for 5 yrs- Dr Candace Cruise  . HERNIA REPAIR    . 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 Cancer Mother        brain  . Stroke Father    Social History   Socioeconomic History  . Marital status: Widowed    Spouse name: Not on file  . Number of children: 2  . Years of education: Not on file  . Highest education level: Bachelor's degree (e.g., BA, AB, BS)  Occupational History  . Occupation: Retired  Scientific laboratory technician  . Financial resource strain: Not hard at all  . Food insecurity:    Worry: Never true    Inability: Never true  . Transportation needs:    Medical: No    Non-medical: No  Tobacco Use  . Smoking status: Former Smoker    Packs/day: 1.50  Years: 30.00    Pack years: 45.00    Types: Cigarettes    Last attempt to quit: 1996    Years since quitting: 24.1  . Smokeless tobacco: Never Used  . Tobacco comment: smoking cessatioinn materials not required  Substance and Sexual Activity  . Alcohol use: No    Alcohol/week: 0.0 standard drinks    Frequency: Never  . Drug use: No  . Sexual activity: Not Currently  Lifestyle  . Physical activity:    Days per week: 0 days    Minutes per session: 0 min  . Stress: Not at all  Relationships  . Social connections:    Talks on phone: More than three times a week    Gets together: Three times a week    Attends religious service: Never    Active member of club or  organization: No    Attends meetings of clubs or organizations: Never    Relationship status: Widowed  Other Topics Concern  . Not on file  Social History Narrative  . Not on file    Outpatient Encounter Medications as of 01/08/2019  Medication Sig  . amLODipine-benazepril (LOTREL) 10-20 MG capsule TAKE ONE (1) CAPSULE EACH DAY.  Marland Kitchen Ascorbic Acid (VITAMIN C) 100 MG tablet Take 1,000 mg by mouth daily.   Marland Kitchen aspirin EC 81 MG tablet Take 81 mg by mouth daily.   Marland Kitchen atorvastatin (LIPITOR) 20 MG tablet Take 1 tablet (20 mg total) by mouth daily.  . carvedilol (COREG) 6.25 MG tablet TAKE (1) TABLET BY MOUTH TWICE DAILY  . cholecalciferol (VITAMIN D) 400 units TABS tablet Take 1,000 Units by mouth daily.   . diclofenac (VOLTAREN) 75 MG EC tablet TAKE (1) TABLET BY MOUTH EVERY DAY  . fenofibrate (TRICOR) 145 MG tablet TAKE 1 TABLET EVERY MORNING AT 6AM.  . fluticasone (FLONASE) 50 MCG/ACT nasal spray Place 1 spray into both nostrils daily as needed (for sinus/allergy issues.).  Marland Kitchen Garlic 161 MG CAPS Take 1,000 mg by mouth daily.   . hydrochlorothiazide (MICROZIDE) 12.5 MG capsule TAKE (1) CAPSULE BY MOUTH EVERY DAY AT 6AM  . loratadine (CLARITIN) 10 MG tablet TAKE (1) TABLET BY MOUTH EVERY DAY  . Multiple Vitamin (MULTI-VITAMINS) TABS Take 1 tablet by mouth daily at 6 (six) AM.  . omega-3 acid ethyl esters (LOVAZA) 1 g capsule TAKE TWO CAPSULES TWICE DAILY. (Patient taking differently: Take 1 g by mouth daily. otc)  . sertraline (ZOLOFT) 50 MG tablet Take 1 tablet (50 mg total) by mouth daily at 6 (six) AM.  . [DISCONTINUED] diclofenac (VOLTAREN) 75 MG EC tablet TAKE (1) TABLET BY MOUTH EVERY DAY  . [DISCONTINUED] sertraline (ZOLOFT) 50 MG tablet TAKE (1) TABLET BY MOUTH EVERY DAY AT 6 IN THE MORNING  . [DISCONTINUED] sildenafil (VIAGRA) 100 MG tablet Take 1 tablet (100 mg total) by mouth daily as needed for erectile dysfunction. (Patient not taking: Reported on 03/14/2018)   No facility-administered  encounter medications on file as of 01/08/2019.     Activities of Daily Living In your present state of health, do you have any difficulty performing the following activities: 01/08/2019 04/02/2018  Hearing? N -  Comment declines hearing aids -  Vision? N -  Comment reading glasses -  Difficulty concentrating or making decisions? N -  Walking or climbing stairs? N -  Dressing or bathing? N -  Doing errands, shopping? N N  Preparing Food and eating ? N -  Using the Toilet? N -  In  the past six months, have you accidently leaked urine? N -  Do you have problems with loss of bowel control? N -  Managing your Medications? N -  Managing your Finances? N -  Housekeeping or managing your Housekeeping? N -  Some recent data might be hidden    Patient Care Team: Juline Patch, MD as PCP - General (Family Medicine) Reche Dixon, PA-C as Consulting Physician (Orthopedic Surgery) Leanor Kail, MD as Consulting Physician (Orthopedic Surgery)   Assessment:   This is a routine wellness examination for [place].  Exercise Activities and Dietary recommendations Current Exercise Habits: The patient does not participate in regular exercise at present, Exercise limited by: orthopedic condition(s);cardiac condition(s)  Goals    . DIET - INCREASE WATER INTAKE     Recommend to drink at least 6-8 8oz glasses of water per day.    . Increase physical activity     Pt would like to increase physical activity over the next year as tolerated.       Fall Risk Fall Risk  01/08/2019 01/07/2018 01/05/2017 07/21/2016 07/20/2015  Falls in the past year? 1 No No No No  Number falls in past yr: 1 - - - -  Injury with Fall? 0 - - - -  Risk for fall due to : History of fall(s) Impaired balance/gait;Impaired mobility;Medication side effect - - -  Risk for fall due to: Comment fell prior to knee and hip replacement Left knee and Right hip pain. Scheduled for surgery - - -  Follow up Falls prevention discussed - -  - -   FALL RISK PREVENTION PERTAINING TO THE HOME:  Any stairs in or around the home? No  If so, are they are without handrails? No   Home free of loose throw rugs in walkways, pet beds, electrical cords, etc? Yes  Adequate lighting in your home to reduce risk of falls? Yes   ASSISTIVE DEVICES UTILIZED TO PREVENT FALLS:  Life alert? No  Use of a cane, walker or w/c? No  Grab bars in the bathroom? No  Shower chair or bench in shower? Yes  Elevated toilet seat or a handicapped toilet? Yes   DME ORDERS:  DME order needed?  No   TIMED UP AND GO:  Was the test performed? Yes .  Length of time to ambulate 10 feet: 5 sec.   GAIT:  Appearance of gait: Gait stead-fast and without the use of an assistive device.    Education: Fall risk prevention has been discussed.  Intervention(s) required? No   Depression Screen PHQ 2/9 Scores 01/08/2019 07/16/2018 01/07/2018 07/10/2017  PHQ - 2 Score 0 0 0 0  PHQ- 9 Score - 0 0 0    Cognitive Function     6CIT Screen 01/08/2019 01/07/2018 01/05/2017  What Year? 0 points 0 points 0 points  What month? 0 points 0 points 0 points  What time? 0 points 0 points 0 points  Count back from 20 0 points 0 points 0 points  Months in reverse 0 points 0 points 0 points  Repeat phrase 0 points 0 points 0 points  Total Score 0 0 0    Immunization History  Administered Date(s) Administered  . Influenza, High Dose Seasonal PF 08/17/2017, 08/05/2018  . Influenza,inj,Quad PF,6+ Mos 09/13/2015, 08/28/2016  . Pneumococcal Conjugate-13 09/18/2014  . Pneumococcal Polysaccharide-23 12/28/2012, 01/07/2018  . Tdap 01/05/2017  . Zoster Recombinat (Shingrix) 02/08/2017, 06/13/2017    Qualifies for Shingles Vaccine? Shingrix series completed 2018.  Tdap: Up to date  Flu Vaccine: Up to date  Pneumococcal Vaccine: Up to date   Screening Tests Health Maintenance  Topic Date Due  . Hepatitis C Screening  17-Jun-1946  . COLONOSCOPY  12/29/2019  .  TETANUS/TDAP  01/05/2027  . INFLUENZA VACCINE  Completed  . PNA vac Low Risk Adult  Completed   Cancer Screenings:  Colorectal Screening: Completed 03/11/15. Repeat every 5 years.   Lung Cancer Screening: (Low Dose CT Chest recommended if Age 64-80 years, 30 pack-year currently smoking OR have quit w/in 15years.) does not qualify.   Additional Screening:  Hepatitis C Screening: does qualify; ordered today for next blood draw.  Vision Screening: Recommended annual ophthalmology exams for early detection of glaucoma and other disorders of the eye. Is the patient up to date with their annual eye exam?  No  Who is the provider or what is the name of the office in which the pt attends annual eye exams? No provider  If pt is not established with a provider, would they like to be referred to a provider to establish care? No .   Dental Screening: Recommended annual dental exams for proper oral hygiene  Community Resource Referral:  CRR required this visit?  No        Plan:      I have personally reviewed and addressed the Medicare Annual Wellness questionnaire and have noted the following in the patient's chart:  A. Medical and social history B. Use of alcohol, tobacco or illicit drugs  C. Current medications and supplements D. Functional ability and status E.  Nutritional status F.  Physical activity G. Advance directives H. List of other physicians I.  Hospitalizations, surgeries, and ER visits in previous 12 months J.  [place] such as hearing and vision if needed, cognitive and depression L. Referrals and appointments   In addition, I have reviewed and discussed with patient certain preventive protocols, quality metrics, and best practice recommendations. A written personalized care plan for preventive services as well as general preventive health recommendations were provided to patient.   Signed,  Clemetine Marker, LPN Nurse Health Advisor   Nurse Notes:  advised pt to schedule follow up appt for med management with Dr. Ronnald Ramp per Baxter Flattery.

## 2019-01-08 NOTE — Patient Instructions (Signed)
Gary Kramer , Thank you for taking time to come for your Medicare Wellness Visit. I appreciate your ongoing commitment to your health goals. Please review the following plan we discussed and let me know if I can assist you in the future.   Screening recommendations/referrals: Colonoscopy: done 12/28/14 Recommended yearly ophthalmology/optometry visit for glaucoma screening and checkup Recommended yearly dental visit for hygiene and checkup  Vaccinations: Influenza vaccine: done 08/05/18 Pneumococcal vaccine: done 01/07/18 Tdap vaccine: done 01/05/17 Shingles vaccine: Shingrix series completed 2018.    Conditions/risks identified: Recommend increasing physical activity as tolerated.   Next appointment: Please follow up in one year for your Medicare Annual Wellness visit.   Preventive Care 71 Years and Older, Male Preventive care refers to lifestyle choices and visits with your health care provider that can promote health and wellness. What does preventive care include?  A yearly physical exam. This is also called an annual well check.  Dental exams once or twice a year.  Routine eye exams. Ask your health care provider how often you should have your eyes checked.  Personal lifestyle choices, including:  Daily care of your teeth and gums.  Regular physical activity.  Eating a healthy diet.  Avoiding tobacco and drug use.  Limiting alcohol use.  Practicing safe sex.  Taking low doses of aspirin every day.  Taking vitamin and mineral supplements as recommended by your health care provider. What happens during an annual well check? The services and screenings done by your health care provider during your annual well check will depend on your age, overall health, lifestyle risk factors, and family history of disease. Counseling  Your health care provider may ask you questions about your:  Alcohol use.  Tobacco use.  Drug use.  Emotional well-being.  Home and relationship  well-being.  Sexual activity.  Eating habits.  History of falls.  Memory and ability to understand (cognition).  Work and work Statistician. Screening  You may have the following tests or measurements:  Height, weight, and BMI.  Blood pressure.  Lipid and cholesterol levels. These may be checked every 5 years, or more frequently if you are over 63 years old.  Skin check.  Lung cancer screening. You may have this screening every year starting at age 78 if you have a 30-pack-year history of smoking and currently smoke or have quit within the past 15 years.  Fecal occult blood test (FOBT) of the stool. You may have this test every year starting at age 52.  Flexible sigmoidoscopy or colonoscopy. You may have a sigmoidoscopy every 5 years or a colonoscopy every 10 years starting at age 59.  Prostate cancer screening. Recommendations will vary depending on your family history and other risks.  Hepatitis C blood test.  Hepatitis B blood test.  Sexually transmitted disease (STD) testing.  Diabetes screening. This is done by checking your blood sugar (glucose) after you have not eaten for a while (fasting). You may have this done every 1-3 years.  Abdominal aortic aneurysm (AAA) screening. You may need this if you are a current or former smoker.  Osteoporosis. You may be screened starting at age 59 if you are at high risk. Talk with your health care provider about your test results, treatment options, and if necessary, the need for more tests. Vaccines  Your health care provider may recommend certain vaccines, such as:  Influenza vaccine. This is recommended every year.  Tetanus, diphtheria, and acellular pertussis (Tdap, Td) vaccine. You may need a Td booster  every 10 years.  Zoster vaccine. You may need this after age 18.  Pneumococcal 13-valent conjugate (PCV13) vaccine. One dose is recommended after age 67.  Pneumococcal polysaccharide (PPSV23) vaccine. One dose is  recommended after age 15. Talk to your health care provider about which screenings and vaccines you need and how often you need them. This information is not intended to replace advice given to you by your health care provider. Make sure you discuss any questions you have with your health care provider. Document Released: 12/10/2015 Document Revised: 08/02/2016 Document Reviewed: 09/14/2015 Elsevier Interactive Patient Education  2017 [place] Prevention in the Home Falls can cause injuries. They can happen to people of all ages. There are many things you can do to make your home safe and to help prevent falls. What can I do on the outside of my home?  Regularly fix the edges of walkways and driveways and fix any cracks.  Remove anything that might make you trip as you walk through a door, such as a raised step or threshold.  Trim any bushes or trees on the path to your home.  Use bright outdoor lighting.  Clear any walking paths of anything that might make someone trip, such as rocks or tools.  Regularly check to see if handrails are loose or broken. Make sure that both sides of any steps have handrails.  Any raised decks and porches should have guardrails on the edges.  Have any leaves, snow, or ice cleared regularly.  Use sand or salt on walking paths during winter.  Clean up any spills in your garage right away. This includes oil or grease spills. What can I do in the bathroom?  Use night lights.  Install grab bars by the toilet and in the tub and shower. Do not use towel bars as grab bars.  Use non-skid mats or decals in the tub or shower.  If you need to sit down in the shower, use a plastic, non-slip stool.  Keep the floor dry. Clean up any water that spills on the floor as soon as it happens.  Remove soap buildup in the tub or shower regularly.  Attach bath mats securely with double-sided non-slip rug tape.  Do not have throw rugs and other things on  the floor that can make you trip. What can I do in the bedroom?  Use night lights.  Make sure that you have a light by your bed that is easy to reach.  Do not use any sheets or blankets that are too big for your bed. They should not hang down onto the floor.  Have a firm chair that has side arms. You can use this for support while you get dressed.  Do not have throw rugs and other things on the floor that can make you trip. What can I do in the kitchen?  Clean up any spills right away.  Avoid walking on wet floors.  Keep items that you use a lot in easy-to-reach places.  If you need to reach something above you, use a strong step stool that has a grab bar.  Keep electrical cords out of the way.  Do not use floor polish or wax that makes floors slippery. If you must use wax, use non-skid floor wax.  Do not have throw rugs and other things on the floor that can make you trip. What can I do with my stairs?  Do not leave any items on the stairs.  Make  sure that there are handrails on both sides of the stairs and use them. Fix handrails that are broken or loose. Make sure that handrails are as long as the stairways.  Check any carpeting to make sure that it is firmly attached to the stairs. Fix any carpet that is loose or worn.  Avoid having throw rugs at the top or bottom of the stairs. If you do have throw rugs, attach them to the floor with carpet tape.  Make sure that you have a light switch at the top of the stairs and the bottom of the stairs. If you do not have them, ask someone to add them for you. What else can I do to help prevent falls?  Wear shoes that:  Do not have high heels.  Have rubber bottoms.  Are comfortable and fit you well.  Are closed at the toe. Do not wear sandals.  If you use a stepladder:  Make sure that it is fully opened. Do not climb a closed stepladder.  Make sure that both sides of the stepladder are locked into place.  Ask someone to  hold it for you, if possible.  Clearly mark and make sure that you can see:  Any grab bars or handrails.  First and last steps.  Where the edge of each step is.  Use tools that help you move around (mobility aids) if they are needed. These include:  Canes.  Walkers.  Scooters.  Crutches.  Turn on the lights when you go into a dark area. Replace any light bulbs as soon as they burn out.  Set up your furniture so you have a clear path. Avoid moving your furniture around.  If any of your floors are uneven, fix them.  If there are any pets around you, be aware of where they are.  Review your medicines with your doctor. Some medicines can make you feel dizzy. This can increase your chance of falling. Ask your doctor what other things that you can do to help prevent falls. This information is not intended to replace advice given to you by your health care provider. Make sure you discuss any questions you have with your health care provider. Document Released: 09/09/2009 Document Revised: 04/20/2016 Document Reviewed: 12/18/2014 Elsevier Interactive Patient Education  2017 [employer].

## 2019-01-16 ENCOUNTER — Ambulatory Visit (INDEPENDENT_AMBULATORY_CARE_PROVIDER_SITE_OTHER): Payer: Medicare HMO | Admitting: Family Medicine

## 2019-01-16 ENCOUNTER — Encounter: Payer: Self-pay | Admitting: Family Medicine

## 2019-01-16 ENCOUNTER — Other Ambulatory Visit: Payer: Self-pay

## 2019-01-16 DIAGNOSIS — I10 Essential (primary) hypertension: Secondary | ICD-10-CM

## 2019-01-16 DIAGNOSIS — F3341 Major depressive disorder, recurrent, in partial remission: Secondary | ICD-10-CM

## 2019-01-16 DIAGNOSIS — E785 Hyperlipidemia, unspecified: Secondary | ICD-10-CM

## 2019-01-16 DIAGNOSIS — M19042 Primary osteoarthritis, left hand: Secondary | ICD-10-CM | POA: Diagnosis not present

## 2019-01-16 DIAGNOSIS — J301 Allergic rhinitis due to pollen: Secondary | ICD-10-CM

## 2019-01-16 DIAGNOSIS — R69 Illness, unspecified: Secondary | ICD-10-CM | POA: Diagnosis not present

## 2019-01-16 DIAGNOSIS — M19041 Primary osteoarthritis, right hand: Secondary | ICD-10-CM | POA: Diagnosis not present

## 2019-01-16 MED ORDER — HYDROCHLOROTHIAZIDE 12.5 MG PO CAPS: ORAL_CAPSULE | ORAL | 5 refills | Status: DC

## 2019-01-16 MED ORDER — OMEGA-3-ACID ETHYL ESTERS 1 G PO CAPS: 1.0000 g | ORAL_CAPSULE | Freq: Every day | ORAL | 3 refills | Status: DC

## 2019-01-16 MED ORDER — DICLOFENAC SODIUM 75 MG PO TBEC: DELAYED_RELEASE_TABLET | ORAL | 1 refills | Status: DC

## 2019-01-16 MED ORDER — FENOFIBRATE 145 MG PO TABS: ORAL_TABLET | ORAL | 5 refills | Status: DC

## 2019-01-16 MED ORDER — SERTRALINE HCL 50 MG PO TABS: 50.0000 mg | ORAL_TABLET | Freq: Every day | ORAL | 5 refills | Status: DC

## 2019-01-16 MED ORDER — AMLODIPINE BESY-BENAZEPRIL HCL 10-20 MG PO CAPS: ORAL_CAPSULE | ORAL | 5 refills | Status: DC

## 2019-01-16 MED ORDER — ATORVASTATIN CALCIUM 20 MG PO TABS: 20.0000 mg | ORAL_TABLET | Freq: Every day | ORAL | 5 refills | Status: DC

## 2019-01-16 MED ORDER — CARVEDILOL 6.25 MG PO TABS: ORAL_TABLET | ORAL | 5 refills | Status: DC

## 2019-01-16 MED ORDER — LORATADINE 10 MG PO TABS: ORAL_TABLET | ORAL | 11 refills | Status: DC

## 2019-01-16 MED ORDER — FLUTICASONE PROPIONATE 50 MCG/ACT NA SUSP: 1.0000 | Freq: Every day | NASAL | 11 refills | Status: DC | PRN

## 2019-01-16 NOTE — Progress Notes (Signed)
Date:  01/16/2019   Name:  Gary Kramer   DOB:  [birth year]   MRN:  [id number]   Chief Complaint: Hypertension  Hypertension  This is a chronic problem. The current episode started more than 1 year ago. The problem is unchanged. The problem is controlled. Pertinent negatives include no anxiety, blurred vision, chest pain, headaches, malaise/fatigue, neck pain, orthopnea, palpitations, peripheral edema, PND, shortness of breath or sweats. Past treatments include calcium channel blockers, beta blockers, diuretics and ACE inhibitors. The current treatment provides moderate improvement. There are no compliance problems.  There is no history of angina, kidney disease, CAD/MI, CVA, heart failure, left ventricular hypertrophy, PVD or retinopathy. There is no history of chronic renal disease, a hypertension causing med or renovascular disease. Hyperaldosteronism: hyperlip.  Hyperlipidemia  This is a chronic problem. The current episode started more than 1 year ago. The problem is controlled. Recent lipid tests were reviewed and are normal. He has no history of chronic renal disease, diabetes, hypothyroidism, liver disease, obesity or nephrotic syndrome. Factors aggravating his hyperlipidemia include thiazides. Pertinent negatives include no chest pain, focal sensory loss, focal weakness, leg pain, myalgias or shortness of breath. Current antihyperlipidemic treatment includes statins and fibric acid derivatives. The current treatment provides moderate improvement of lipids. There are no compliance problems.  Risk factors for coronary artery disease include hypertension, dyslipidemia and male sex.  Depression         This is a chronic problem.  The current episode started more than 1 year ago.   The problem occurs rarely.  The problem has been waxing and waning since onset.  Associated symptoms include no decreased concentration, no fatigue, no helplessness, no hopelessness, does not have insomnia, not  irritable, no restlessness, no decreased interest, no appetite change, no body aches, no myalgias, no headaches, no indigestion, not sad and no suicidal ideas.  Past treatments include SSRIs - Selective serotonin reuptake inhibitors.  Compliance with treatment is good.  Previous treatment provided moderate relief.   Pertinent negatives include no hypothyroidism and no anxiety.   Review of Systems  Constitutional: Negative for appetite change, chills, fatigue, fever and malaise/fatigue.  HENT: Negative for drooling, ear discharge, ear pain and sore throat.   Eyes: Negative for blurred vision.  Respiratory: Negative for cough, shortness of breath and wheezing.   Cardiovascular: Negative for chest pain, palpitations, orthopnea, leg swelling and PND.  Gastrointestinal: Negative for abdominal pain, blood in stool, constipation, diarrhea and nausea.  Endocrine: Negative for polydipsia.  Genitourinary: Negative for dysuria, frequency, hematuria and urgency.  Musculoskeletal: Negative for back pain, myalgias and neck pain.  Skin: Negative for rash.  Allergic/Immunologic: Negative for environmental allergies.  Neurological: Negative for dizziness, focal weakness and headaches.  Hematological: Does not bruise/bleed easily.  Psychiatric/Behavioral: Positive for depression. Negative for decreased concentration and suicidal ideas. The patient is not nervous/anxious and does not have insomnia.     Patient Active Problem List   Diagnosis Date Noted  . Obesity (BMI 30.0-34.9) 06/06/2018  . Status post total hip replacement, right 04/02/2018  . Presence of left artificial knee joint 01/24/2018  . Recurrent major depressive disorder, in partial remission ([place]) 01/16/2018  . Primary osteoarthritis of left knee 12/26/2017  . Chronic allergic rhinitis 01/05/2017  . Essential hypertension 07/20/2015  . Hyperlipidemia 07/20/2015  . Erectile dysfunction 07/20/2015  . Depression 07/20/2015  . Allergic  rhinitis due to pollen 07/20/2015  . Degenerative joint disease of hand 07/20/2015    Allergies  Allergen  Reactions  . Cefaclor Itching  . Penicillin V Potassium Rash    Has patient had a PCN reaction causing immediate rash, facial/tongue/throat swelling, SOB or lightheadedness with hypotension: No Has patient had a PCN reaction causing severe rash involving mucus membranes or skin necrosis: Unknown Has patient had a PCN reaction that required hospitalization: No Has patient had a PCN reaction occurring within the last 10 years: No If all of the above answers are "NO", then may proceed with Cephalosporin use.     Past Surgical History:  Procedure Laterality Date  . COLONOSCOPY  2016   cleared for 5 yrs- Dr Candace Cruise  . HERNIA REPAIR    . 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   Procedure: TOTAL HIP ARTHROPLASTY;  Surgeon: Corky Mull, MD;  Location: [facility] ORS;  Service: Orthopedics;  Laterality: Right;    Social History   Tobacco Use  . Smoking status: Former Smoker    Packs/day: 1.50    Years: 30.00    Pack years: 45.00    Types: Cigarettes    Last attempt to quit: 1996    Years since quitting: 24.1  . Smokeless tobacco: Never Used  . Tobacco comment: smoking cessatioinn materials not required  Substance Use Topics  . Alcohol use: No    Alcohol/week: 0.0 standard drinks    Frequency: Never  . Drug use: No     Medication list has been reviewed and updated.  Current Meds  Medication Sig  . amLODipine-benazepril (LOTREL) 10-20 MG capsule TAKE ONE (1) CAPSULE EACH DAY.  Marland Kitchen Ascorbic Acid (VITAMIN C) 100 MG tablet Take 1,000 mg by mouth daily.   Marland Kitchen aspirin EC 81 MG tablet Take 81 mg by mouth daily.   Marland Kitchen atorvastatin (LIPITOR) 20 MG tablet Take 1 tablet (20 mg total) by mouth daily.  . carvedilol (COREG) 6.25 MG tablet TAKE (1) TABLET BY MOUTH TWICE DAILY  . cholecalciferol (VITAMIN D) 400 units TABS tablet Take 1,000 Units by mouth  daily.   . diclofenac (VOLTAREN) 75 MG EC tablet TAKE (1) TABLET BY MOUTH EVERY DAY  . fenofibrate (TRICOR) 145 MG tablet TAKE 1 TABLET EVERY MORNING AT 6AM.  . fluticasone (FLONASE) 50 MCG/ACT nasal spray Place 1 spray into both nostrils daily as needed (for sinus/allergy issues.).  Marland Kitchen Garlic 932 MG CAPS Take 1,000 mg by mouth daily.   . hydrochlorothiazide (MICROZIDE) 12.5 MG capsule TAKE (1) CAPSULE BY MOUTH EVERY DAY AT 6AM  . loratadine (CLARITIN) 10 MG tablet TAKE (1) TABLET BY MOUTH EVERY DAY  . Multiple Vitamin (MULTI-VITAMINS) TABS Take 1 tablet by mouth daily at 6 (six) AM.  . omega-3 acid ethyl esters (LOVAZA) 1 g capsule TAKE TWO CAPSULES TWICE DAILY. (Patient taking differently: Take 1 g by mouth daily. otc)  . sertraline (ZOLOFT) 50 MG tablet Take 1 tablet (50 mg total) by mouth daily at 6 (six) AM.    PHQ 2/9 Scores 01/08/2019 07/16/2018 01/07/2018 07/10/2017  PHQ - 2 Score 0 0 0 0  PHQ- 9 Score - 0 0 0    Physical Exam Vitals signs and nursing note reviewed.  Constitutional:      General: He is not irritable. HENT:     Head: Normocephalic.     Right Ear: Tympanic membrane, ear canal and external ear normal.     Left Ear: Tympanic membrane, ear canal and external ear normal.     Nose: Nose normal. No congestion or rhinorrhea.  Mouth/Throat:     Mouth: Mucous membranes are moist.  Eyes:     General: No scleral icterus.       Right eye: No discharge.        Left eye: No discharge.     Conjunctiva/sclera: Conjunctivae normal.     Pupils: Pupils are equal, round, and reactive to light.  Neck:     Musculoskeletal: Normal range of motion and neck supple. No neck rigidity or muscular tenderness.     Thyroid: No thyromegaly.     Vascular: No carotid bruit or JVD.     Trachea: No tracheal deviation.  Cardiovascular:     Rate and Rhythm: Normal rate and regular rhythm.     Pulses: Normal pulses.     Heart sounds: Normal heart sounds. No murmur. No friction rub. No gallop.    Pulmonary:     Effort: Pulmonary effort is normal. No respiratory distress.     Breath sounds: Normal breath sounds. No wheezing or rales.  Abdominal:     General: Bowel sounds are normal.     Palpations: Abdomen is soft. There is no mass.     Tenderness: There is no abdominal tenderness. There is no guarding or rebound.  Musculoskeletal: Normal range of motion.        General: No tenderness.  Lymphadenopathy:     Cervical: No cervical adenopathy.  Skin:    General: Skin is warm.     Findings: No rash.  Neurological:     Mental Status: He is alert and oriented to person, place, and time.     Cranial Nerves: No cranial nerve deficit.     Deep Tendon Reflexes: Reflexes are normal and symmetric.     BP 128/82   Pulse 71   Resp 16   Ht 6' (1.829 m)   Wt 241 lb (109.3 kg)   SpO2 97%   BMI 32.69 kg/m   Assessment and Plan:  1. Essential hypertension Chronic.  Controlled.  Continue hydrochlorothiazide 12.5 mg daily carvedilol 6.25 twice a day noted pain benazepril 10-20 mg 1 a day.  Renal panel within normal limits recheck in 6 months. - hydrochlorothiazide (MICROZIDE) 12.5 MG capsule; TAKE (1) CAPSULE BY MOUTH EVERY DAY AT 6AM  Dispense: 30 capsule; Refill: 5 - carvedilol (COREG) 6.25 MG tablet; TAKE (1) TABLET BY MOUTH TWICE DAILY  Dispense: 60 tablet; Refill: 5 - amLODipine-benazepril (LOTREL) 10-20 MG capsule; TAKE ONE (1) CAPSULE EACH DAY.  Dispense: 30 capsule; Refill: 5  2. Hyperlipidemia Chronic.  Controlled.  Continue medical 3 and Lovaza 1 g daily - omega-3 acid ethyl esters (LOVAZA) 1 g capsule; Take 1 capsule (1 g total) by mouth daily. otc  Dispense: 100 capsule; Refill: 3  3. Hyperlipidemia, unspecified hyperlipidemia type Chronic.  Controlled.  Continue atorvastatin 20 mg once a day and fenofibrate 145 once a day - atorvastatin (LIPITOR) 20 MG tablet; Take 1 tablet (20 mg total) by mouth daily.  Dispense: 30 tablet; Refill: 5 - fenofibrate (TRICOR) 145 MG  tablet; TAKE 1 TABLET EVERY MORNING AT 6AM.  Dispense: 30 tablet; Refill: 5  4. Recurrent major depressive disorder, in partial remission (HCC) Chronic.  Stable.  Continue sertraline 50 mg once a day recheck as needed. - sertraline (ZOLOFT) 50 MG tablet; Take 1 tablet (50 mg total) by mouth daily at 6 (six) AM.  Dispense: 30 tablet; Refill: 5  5. Seasonal allergic rhinitis due to pollen Allergic rhinitis controlled with Flonase 50 mg as needed for seasonal  allergies. - fluticasone (FLONASE) 50 MCG/ACT nasal spray; Place 1 spray into both nostrils daily as needed (for sinus/allergy issues.).  Dispense: 16 g; Refill: 11  6. Osteoarthritis of both hands, unspecified osteoarthritis type Osteoarthritic arthritis of both hands controlled with diclofenac 75 mg daily - diclofenac (VOLTAREN) 75 MG EC tablet; TAKE (1) TABLET BY MOUTH EVERY DAY  Dispense: 30 tablet; Refill: 1

## 2019-01-31 ENCOUNTER — Other Ambulatory Visit: Payer: Self-pay | Admitting: Family Medicine

## 2019-01-31 DIAGNOSIS — M19041 Primary osteoarthritis, right hand: Secondary | ICD-10-CM

## 2019-01-31 DIAGNOSIS — M19042 Primary osteoarthritis, left hand: Principal | ICD-10-CM

## 2019-02-17 ENCOUNTER — Other Ambulatory Visit: Payer: Self-pay

## 2019-02-17 DIAGNOSIS — E785 Hyperlipidemia, unspecified: Secondary | ICD-10-CM

## 2019-02-17 DIAGNOSIS — I10 Essential (primary) hypertension: Secondary | ICD-10-CM

## 2019-02-17 DIAGNOSIS — M19042 Primary osteoarthritis, left hand: Secondary | ICD-10-CM

## 2019-02-17 DIAGNOSIS — F3341 Major depressive disorder, recurrent, in partial remission: Secondary | ICD-10-CM

## 2019-02-17 DIAGNOSIS — M19041 Primary osteoarthritis, right hand: Secondary | ICD-10-CM

## 2019-02-17 MED ORDER — FENOFIBRATE 145 MG PO TABS: ORAL_TABLET | ORAL | 1 refills | Status: DC

## 2019-02-17 MED ORDER — SERTRALINE HCL 50 MG PO TABS: 50.0000 mg | ORAL_TABLET | Freq: Every day | ORAL | 1 refills | Status: DC

## 2019-02-17 MED ORDER — DICLOFENAC SODIUM 75 MG PO TBEC: DELAYED_RELEASE_TABLET | ORAL | 1 refills | Status: DC

## 2019-02-17 MED ORDER — AMLODIPINE BESY-BENAZEPRIL HCL 10-20 MG PO CAPS: ORAL_CAPSULE | ORAL | 1 refills | Status: DC

## 2019-02-17 MED ORDER — CARVEDILOL 6.25 MG PO TABS: ORAL_TABLET | ORAL | 1 refills | Status: DC

## 2019-02-17 MED ORDER — ATORVASTATIN CALCIUM 20 MG PO TABS: 20.0000 mg | ORAL_TABLET | Freq: Every day | ORAL | 1 refills | Status: DC

## 2019-02-17 MED ORDER — LORATADINE 10 MG PO TABS: ORAL_TABLET | ORAL | 1 refills | Status: DC

## 2019-02-17 MED ORDER — HYDROCHLOROTHIAZIDE 12.5 MG PO CAPS: ORAL_CAPSULE | ORAL | 1 refills | Status: DC

## 2019-07-04 ENCOUNTER — Encounter: Payer: Self-pay | Admitting: Family Medicine

## 2019-07-04 ENCOUNTER — Other Ambulatory Visit: Payer: Self-pay

## 2019-07-04 ENCOUNTER — Ambulatory Visit (INDEPENDENT_AMBULATORY_CARE_PROVIDER_SITE_OTHER): Payer: Medicare HMO | Admitting: Family Medicine

## 2019-07-04 VITALS — BP 128/74 | HR 69 | Ht 72.0 in | Wt 244.0 lb

## 2019-07-04 DIAGNOSIS — F3341 Major depressive disorder, recurrent, in partial remission: Secondary | ICD-10-CM

## 2019-07-04 DIAGNOSIS — J301 Allergic rhinitis due to pollen: Secondary | ICD-10-CM

## 2019-07-04 DIAGNOSIS — I10 Essential (primary) hypertension: Secondary | ICD-10-CM | POA: Diagnosis not present

## 2019-07-04 DIAGNOSIS — E785 Hyperlipidemia, unspecified: Secondary | ICD-10-CM | POA: Diagnosis not present

## 2019-07-04 DIAGNOSIS — R69 Illness, unspecified: Secondary | ICD-10-CM | POA: Diagnosis not present

## 2019-07-04 MED ORDER — FENOFIBRATE 145 MG PO TABS: ORAL_TABLET | ORAL | 1 refills | Status: DC

## 2019-07-04 MED ORDER — SERTRALINE HCL 50 MG PO TABS: 50.0000 mg | ORAL_TABLET | Freq: Every day | ORAL | 1 refills | Status: DC

## 2019-07-04 MED ORDER — ATORVASTATIN CALCIUM 20 MG PO TABS: 20.0000 mg | ORAL_TABLET | Freq: Every day | ORAL | 1 refills | Status: DC

## 2019-07-04 MED ORDER — LORATADINE 10 MG PO TABS: ORAL_TABLET | ORAL | 1 refills | Status: DC

## 2019-07-04 MED ORDER — CARVEDILOL 6.25 MG PO TABS: ORAL_TABLET | ORAL | 1 refills | Status: DC

## 2019-07-04 MED ORDER — FLUTICASONE PROPIONATE 50 MCG/ACT NA SUSP: 1.0000 | Freq: Every day | NASAL | 11 refills | Status: DC | PRN

## 2019-07-04 MED ORDER — HYDROCHLOROTHIAZIDE 12.5 MG PO CAPS: ORAL_CAPSULE | ORAL | 1 refills | Status: DC

## 2019-07-04 MED ORDER — AMLODIPINE BESY-BENAZEPRIL HCL 10-20 MG PO CAPS: ORAL_CAPSULE | ORAL | 1 refills | Status: DC

## 2019-07-04 NOTE — Progress Notes (Signed)
Date:  07/04/2019   Name:  Gary Kramer   DOB:  1946/04/25   MRN:  [id number]   Chief Complaint: Allergic Rhinitis , Hypertension, Hyperlipidemia, and Depression  Hypertension This is a chronic problem. The current episode started more than 1 year ago. The problem has been gradually improving since onset. The problem is controlled. Pertinent negatives include no anxiety, blurred vision, chest pain, headaches, malaise/fatigue, neck pain, orthopnea, palpitations, peripheral edema, PND, shortness of breath or sweats. There are no associated agents to hypertension. There are no known risk factors for coronary artery disease. Past treatments include ACE inhibitors, calcium channel blockers, beta blockers and diuretics. The current treatment provides moderate improvement. There are no compliance problems.  There is no history of angina, kidney disease, CAD/MI, CVA, heart failure, left ventricular hypertrophy, PVD or retinopathy. There is no history of chronic renal disease, a hypertension causing med or renovascular disease.  Hyperlipidemia This is a chronic problem. The current episode started more than 1 year ago. The problem is controlled. Recent lipid tests were reviewed and are normal. He has no history of chronic renal disease, diabetes, hypothyroidism, liver disease, obesity or nephrotic syndrome. There are no known factors aggravating his hyperlipidemia. Pertinent negatives include no chest pain, focal sensory loss, focal weakness, leg pain, myalgias or shortness of breath. Current antihyperlipidemic treatment includes statins. The current treatment provides mild improvement of lipids. There are no compliance problems.  Risk factors for coronary artery disease include hypertension, male sex and dyslipidemia.  Depression        The patient presents with no depression.  This is a chronic problem.  The current episode started more than 1 year ago.   The onset quality is gradual.   The problem  occurs daily.  Associated symptoms include no decreased concentration, no fatigue, no helplessness, no hopelessness, does not have insomnia, not irritable, no restlessness, no decreased interest, no appetite change, no body aches, no myalgias, no headaches, no indigestion, not sad and no suicidal ideas.     The symptoms are aggravated by nothing.  Past treatments include SSRIs - Selective serotonin reuptake inhibitors.  Compliance with treatment is good.  Previous treatment provided moderate relief.   Pertinent negatives include no fibromyalgia, no hypothyroidism, no anxiety and no depression.   Review of Systems  Constitutional: Negative for appetite change, chills, fatigue, fever and malaise/fatigue.  HENT: Negative for drooling, ear discharge, ear pain and sore throat.   Eyes: Negative for blurred vision.  Respiratory: Negative for cough, shortness of breath and wheezing.   Cardiovascular: Negative for chest pain, palpitations, orthopnea, leg swelling and PND.  Gastrointestinal: Negative for abdominal pain, blood in stool, constipation, diarrhea and nausea.  Endocrine: Negative for polydipsia.  Genitourinary: Negative for dysuria, frequency, hematuria and urgency.  Musculoskeletal: Negative for back pain, myalgias and neck pain.  Skin: Negative for rash.  Allergic/Immunologic: Negative for environmental allergies.  Neurological: Negative for dizziness, focal weakness and headaches.  Hematological: Does not bruise/bleed easily.  Psychiatric/Behavioral: Positive for depression. Negative for decreased concentration and suicidal ideas. The patient is not nervous/anxious and does not have insomnia.     Patient Active Problem List   Diagnosis Date Noted  . Obesity (BMI 30.0-34.9) 06/06/2018  . Status post total hip replacement, right 04/02/2018  . Presence of left artificial knee joint 01/24/2018  . Recurrent major depressive disorder, in partial remission ([place]) 01/16/2018  . Primary  osteoarthritis of left knee 12/26/2017  . Chronic allergic rhinitis 01/05/2017  .  Essential hypertension 07/20/2015  . Hyperlipidemia 07/20/2015  . Erectile dysfunction 07/20/2015  . Depression 07/20/2015  . Allergic rhinitis due to pollen 07/20/2015  . Degenerative joint disease of hand 07/20/2015    Allergies  Allergen Reactions  . Cefaclor Itching  . Penicillin V Potassium Rash    Has patient had a PCN reaction causing immediate rash, facial/tongue/throat swelling, SOB or lightheadedness with hypotension: No Has patient had a PCN reaction causing severe rash involving mucus membranes or skin necrosis: Unknown Has patient had a PCN reaction that required hospitalization: No Has patient had a PCN reaction occurring within the last 10 years: No If all of the above answers are "NO", then may proceed with Cephalosporin use.     Past Surgical History:  Procedure Laterality Date  . COLONOSCOPY  2016   cleared for 5 yrs- Dr Candace Cruise  . HERNIA REPAIR    . 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   Procedure: TOTAL HIP ARTHROPLASTY;  Surgeon: Corky Mull, MD;  Location: [facility] ORS;  Service: Orthopedics;  Laterality: Right;    Social History   Tobacco Use  . Smoking status: Former Smoker    Packs/day: 1.50    Years: 30.00    Pack years: 45.00    Types: Cigarettes    Quit date: 1996    Years since quitting: 24.6  . Smokeless tobacco: Never Used  . Tobacco comment: smoking cessatioinn materials not required  Substance Use Topics  . Alcohol use: No    Alcohol/week: 0.0 standard drinks    Frequency: Never  . Drug use: No     Medication list has been reviewed and updated.  Current Meds  Medication Sig  . amLODipine-benazepril (LOTREL) 10-20 MG capsule TAKE ONE (1) CAPSULE EACH DAY.  Marland Kitchen Ascorbic Acid (VITAMIN C) 100 MG tablet Take 1,000 mg by mouth daily.   Marland Kitchen aspirin EC 81 MG tablet Take 81 mg by mouth daily.   Marland Kitchen atorvastatin (LIPITOR) 20  MG tablet Take 1 tablet (20 mg total) by mouth daily.  . carvedilol (COREG) 6.25 MG tablet TAKE (1) TABLET BY MOUTH TWICE DAILY  . cholecalciferol (VITAMIN D) 400 units TABS tablet Take 1,000 Units by mouth daily.   . diclofenac (VOLTAREN) 75 MG EC tablet TAKE (1) TABLET BY MOUTH EVERY DAY  . fenofibrate (TRICOR) 145 MG tablet TAKE 1 TABLET EVERY MORNING AT 6AM.  . fluticasone (FLONASE) 50 MCG/ACT nasal spray Place 1 spray into both nostrils daily as needed (for sinus/allergy issues.).  Marland Kitchen Garlic 031 MG CAPS Take 1,000 mg by mouth daily.   . hydrochlorothiazide (MICROZIDE) 12.5 MG capsule TAKE (1) CAPSULE BY MOUTH EVERY DAY AT 6AM  . loratadine (CLARITIN) 10 MG tablet TAKE (1) TABLET BY MOUTH EVERY DAY  . Multiple Vitamin (MULTI-VITAMINS) TABS Take 1 tablet by mouth daily at 6 (six) AM.  . omega-3 acid ethyl esters (LOVAZA) 1 g capsule Take 1 capsule (1 g total) by mouth daily. otc  . sertraline (ZOLOFT) 50 MG tablet Take 1 tablet (50 mg total) by mouth daily at 6 (six) AM.    PHQ 2/9 Scores 07/04/2019 01/08/2019 07/16/2018 01/07/2018  PHQ - 2 Score 0 0 0 0  PHQ- 9 Score 0 - 0 0    BP Readings from Last 3 Encounters:  07/04/19 128/74  01/16/19 128/82  01/08/19 120/70    Physical Exam Vitals signs and nursing note reviewed.  Constitutional:      General: He  is not irritable. HENT:     Head: Normocephalic.     Right Ear: Tympanic membrane, ear canal and external ear normal.     Left Ear: Tympanic membrane, ear canal and external ear normal.     Nose: Nose normal. No congestion or rhinorrhea.     Mouth/Throat:     Mouth: Mucous membranes are moist.  Eyes:     General: No scleral icterus.       Right eye: No discharge.        Left eye: No discharge.     Conjunctiva/sclera: Conjunctivae normal.     Pupils: Pupils are equal, round, and reactive to light.  Neck:     Musculoskeletal: Normal range of motion and neck supple.     Thyroid: No thyromegaly.     Vascular: No JVD.      Trachea: No tracheal deviation.  Cardiovascular:     Rate and Rhythm: Normal rate and regular rhythm.     Heart sounds: Normal heart sounds. No murmur. No friction rub. No gallop.   Pulmonary:     Effort: No respiratory distress.     Breath sounds: Normal breath sounds. No stridor. No wheezing, rhonchi or rales.  Abdominal:     General: Bowel sounds are normal.     Palpations: Abdomen is soft. There is no mass.     Tenderness: There is no abdominal tenderness. There is no right CVA tenderness, left CVA tenderness, guarding or rebound.  Musculoskeletal: Normal range of motion.        General: No tenderness.  Lymphadenopathy:     Cervical: No cervical adenopathy.  Skin:    General: Skin is warm.     Capillary Refill: Capillary refill takes less than 2 seconds.     Findings: No rash.  Neurological:     Mental Status: He is alert and oriented to person, place, and time.     Cranial Nerves: No cranial nerve deficit.     Motor: No weakness.     Deep Tendon Reflexes: Reflexes are normal and symmetric.     Wt Readings from Last 3 Encounters:  07/04/19 244 lb (110.7 kg)  01/16/19 241 lb (109.3 kg)  01/08/19 241 lb (109.3 kg)    BP 128/74   Pulse 69   Ht 6' (1.829 m)   Wt 244 lb (110.7 kg)   SpO2 94%   BMI 33.09 kg/m   Assessment and Plan: 1. Seasonal allergic rhinitis due to pollen Chronic.  Episodic and seasonal.  Continue Flonase nasal spray and Claritin 10 mg once a day. - fluticasone (FLONASE) 50 MCG/ACT nasal spray; Place 1 spray into both nostrils daily as needed (for sinus/allergy issues.).  Dispense: 16 g; Refill: 11 - loratadine (CLARITIN) 10 MG tablet; TAKE (1) TABLET BY MOUTH EVERY DAY  Dispense: 90 tablet; Refill: 1  2. Recurrent major depressive disorder, in partial remission (HCC) Chronic.  Recurrent.  Partially remission.  We will continue sertraline 50 mg once a day. - sertraline (ZOLOFT) 50 MG tablet; Take 1 tablet (50 mg total) by mouth daily at 6 (six) AM.   Dispense: 90 tablet; Refill: 1  3. Hyperlipidemia, unspecified hyperlipidemia type Chronic.  Controlled.  Continue atorvastatin 20 mg once a day and fenofibrate 145 mg 1 a day.  Will check lipid panel. - Lipid Panel With LDL/HDL Ratio - atorvastatin (LIPITOR) 20 MG tablet; Take 1 tablet (20 mg total) by mouth daily.  Dispense: 90 tablet; Refill: 1 - fenofibrate (TRICOR) 145 MG  tablet; TAKE 1 TABLET EVERY MORNING AT 6AM.  Dispense: 90 tablet; Refill: 1  4. Essential hypertension Chronic.  Controlled.  Continue hydrochlorothiazide 12.5 mg once a day and carvedilol 6.25 twice a day with amlodipine benazepril 10-20 mg once a day.  Will check renal function panel. - Renal function panel - hydrochlorothiazide (MICROZIDE) 12.5 MG capsule; TAKE (1) CAPSULE BY MOUTH EVERY DAY AT 6AM  Dispense: 90 capsule; Refill: 1 - carvedilol (COREG) 6.25 MG tablet; TAKE (1) TABLET BY MOUTH TWICE DAILY  Dispense: 180 tablet; Refill: 1 - amLODipine-benazepril (LOTREL) 10-20 MG capsule; TAKE ONE (1) CAPSULE EACH DAY.  Dispense: 90 capsule; Refill: 1  5. Taking medication for chronic disease Patient on statin for control of lipid.  Will check hepatic panel for hepatotoxicity. - Hepatic Function Panel (6)

## 2019-07-05 LAB — RENAL FUNCTION PANEL
Albumin: 4.7 g/dL (ref 3.7–4.7)
BUN/Creatinine Ratio: 16 (ref 10–24)
BUN: 25 mg/dL (ref 8–27)
CO2: 23 mmol/L (ref 20–29)
Calcium: 10.1 mg/dL (ref 8.6–10.2)
Chloride: 104 mmol/L (ref 96–106)
Creatinine, Ser: 1.58 mg/dL — ABNORMAL HIGH (ref 0.76–1.27)
GFR calc Af Amer: 50 mL/min/{1.73_m2} — ABNORMAL LOW (ref >59)
GFR calc non Af Amer: 43 mL/min/{1.73_m2} — ABNORMAL LOW (ref >59)
Glucose: 130 mg/dL — ABNORMAL HIGH (ref 65–99)
Phosphorus: 3.5 mg/dL (ref 2.8–4.1)
Potassium: 4.6 mmol/L (ref 3.5–5.2)
Sodium: 140 mmol/L (ref 134–144)

## 2019-07-05 LAB — HEPATIC FUNCTION PANEL (6)
ALT: 21 IU/L (ref 0–44)
AST: 21 IU/L (ref 0–40)
Alkaline Phosphatase: 98 IU/L (ref 39–117)
Bilirubin Total: 0.4 mg/dL (ref 0.0–1.2)
Bilirubin, Direct: 0.12 mg/dL (ref 0.00–0.40)

## 2019-07-05 LAB — LIPID PANEL WITH LDL/HDL RATIO
Cholesterol, Total: 158 mg/dL (ref 100–199)
HDL: 32 mg/dL — ABNORMAL LOW (ref >39)
LDL Calculated: 88 mg/dL (ref 0–99)
LDl/HDL Ratio: 2.8 ratio (ref 0.0–3.6)
Triglycerides: 188 mg/dL — ABNORMAL HIGH (ref 0–149)
VLDL Cholesterol Cal: 38 mg/dL (ref 5–40)

## 2019-07-07 ENCOUNTER — Other Ambulatory Visit: Payer: Self-pay

## 2019-07-07 DIAGNOSIS — D229 Melanocytic nevi, unspecified: Secondary | ICD-10-CM | POA: Diagnosis not present

## 2019-07-07 DIAGNOSIS — D2230 Melanocytic nevi of unspecified part of face: Secondary | ICD-10-CM | POA: Diagnosis not present

## 2019-07-07 DIAGNOSIS — R7989 Other specified abnormal findings of blood chemistry: Secondary | ICD-10-CM

## 2019-07-07 DIAGNOSIS — D1800 Hemangioma unspecified site: Secondary | ICD-10-CM | POA: Diagnosis not present

## 2019-07-07 DIAGNOSIS — L918 Other hypertrophic disorders of the skin: Secondary | ICD-10-CM | POA: Diagnosis not present

## 2019-07-07 DIAGNOSIS — L578 Other skin changes due to chronic exposure to nonionizing radiation: Secondary | ICD-10-CM | POA: Diagnosis not present

## 2019-07-07 DIAGNOSIS — L821 Other seborrheic keratosis: Secondary | ICD-10-CM | POA: Diagnosis not present

## 2019-07-07 DIAGNOSIS — D225 Melanocytic nevi of trunk: Secondary | ICD-10-CM | POA: Diagnosis not present

## 2019-07-07 DIAGNOSIS — L820 Inflamed seborrheic keratosis: Secondary | ICD-10-CM | POA: Diagnosis not present

## 2019-07-07 DIAGNOSIS — Z1283 Encounter for screening for malignant neoplasm of skin: Secondary | ICD-10-CM | POA: Diagnosis not present

## 2019-07-07 DIAGNOSIS — L814 Other melanin hyperpigmentation: Secondary | ICD-10-CM | POA: Diagnosis not present

## 2019-07-07 NOTE — Progress Notes (Unsigned)
Ref to neph

## 2019-08-05 ENCOUNTER — Other Ambulatory Visit: Payer: Self-pay

## 2019-08-05 ENCOUNTER — Ambulatory Visit (INDEPENDENT_AMBULATORY_CARE_PROVIDER_SITE_OTHER): Payer: Medicare HMO

## 2019-08-05 DIAGNOSIS — Z23 Encounter for immunization: Secondary | ICD-10-CM | POA: Diagnosis not present

## 2019-08-07 DIAGNOSIS — I10 Essential (primary) hypertension: Secondary | ICD-10-CM | POA: Diagnosis not present

## 2019-08-07 DIAGNOSIS — N183 Chronic kidney disease, stage 3 (moderate): Secondary | ICD-10-CM | POA: Diagnosis not present

## 2019-08-15 ENCOUNTER — Other Ambulatory Visit: Payer: Self-pay | Admitting: Nephrology

## 2019-08-15 ENCOUNTER — Telehealth: Payer: Self-pay | Admitting: Family Medicine

## 2019-08-15 DIAGNOSIS — N183 Chronic kidney disease, stage 3 unspecified: Secondary | ICD-10-CM

## 2019-08-15 NOTE — Telephone Encounter (Signed)
Pt needs all 7 of his medications called in to [facility]

## 2019-08-26 ENCOUNTER — Other Ambulatory Visit: Payer: Self-pay

## 2019-08-26 ENCOUNTER — Ambulatory Visit
Admission: RE | Admit: 2019-08-26 | Discharge: 2019-08-26 | Disposition: A | Discharge status: Home / Self Care | Payer: Medicare HMO | Source: Ambulatory Visit | Attending: Nephrology | Admitting: Nephrology

## 2019-08-26 DIAGNOSIS — N183 Chronic kidney disease, stage 3 unspecified: Secondary | ICD-10-CM

## 2019-09-02 DIAGNOSIS — N183 Chronic kidney disease, stage 3 unspecified: Secondary | ICD-10-CM | POA: Insufficient documentation

## 2019-09-04 DIAGNOSIS — I10 Essential (primary) hypertension: Secondary | ICD-10-CM | POA: Diagnosis not present

## 2019-09-04 DIAGNOSIS — N1831 Chronic kidney disease, stage 3a: Secondary | ICD-10-CM | POA: Diagnosis not present

## 2019-09-07 IMAGING — CR DG CHEST 2V
1 series · 3 of 3 positions shown · non-contrast
Comparison: 01/24/2012.

CLINICAL DATA: Right hip surgery.

EXAM:
CHEST - 2 VIEW

[Series 1: dg chest 2 view · 0.14mm/px · 3 of 3 slices shown]
[im 1/3]
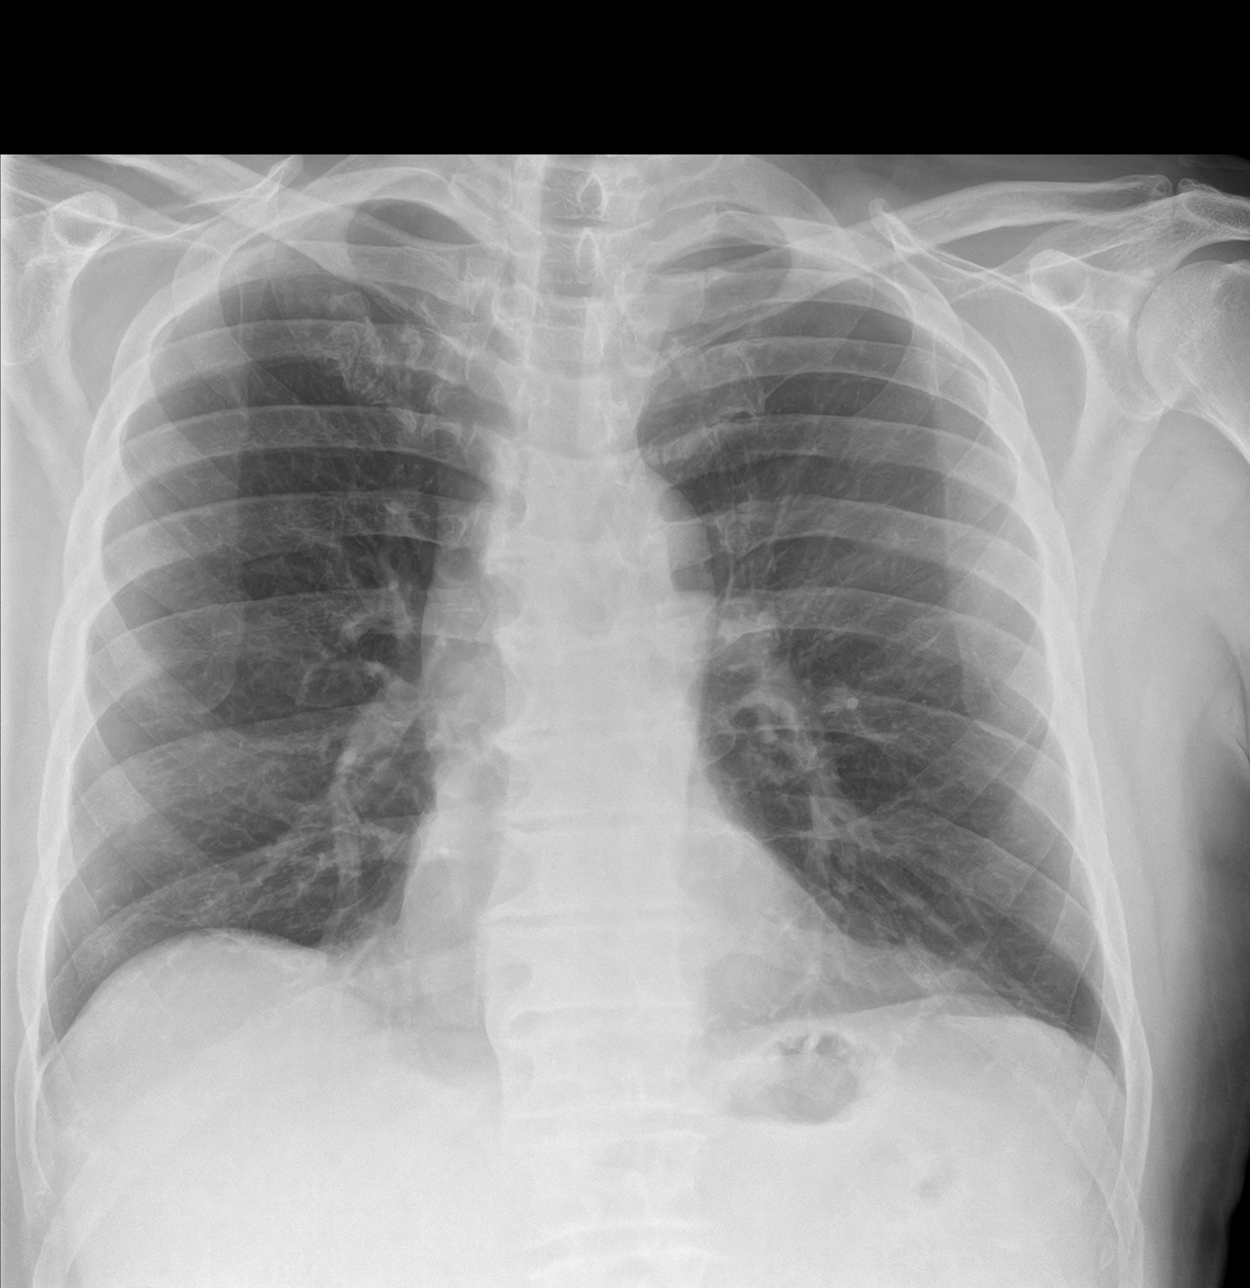
[im 2/3]
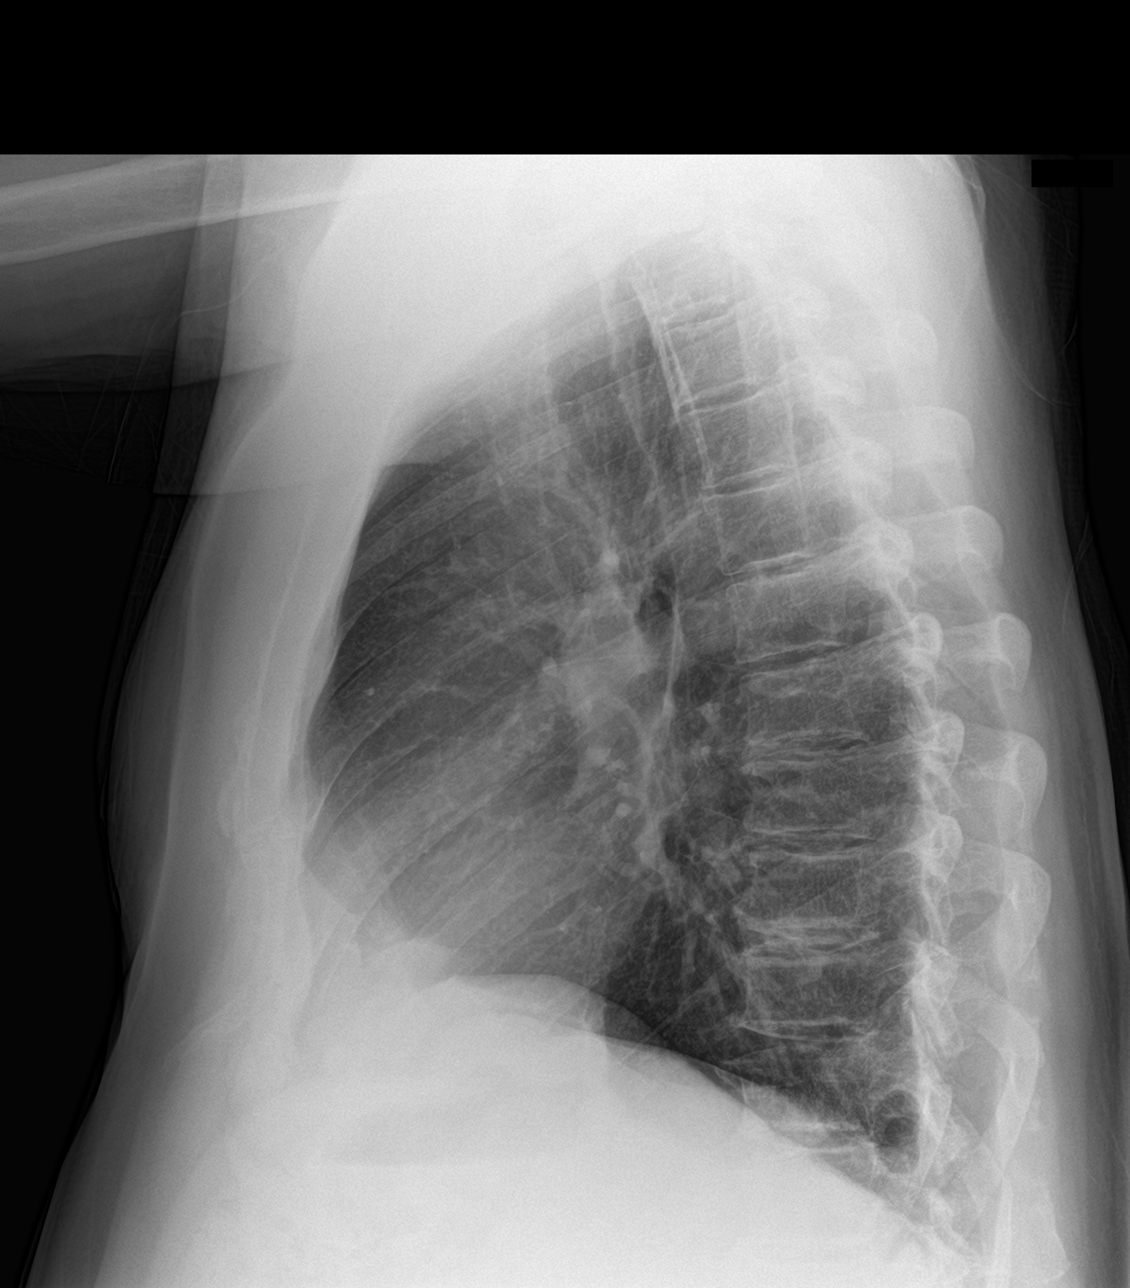
[im 3/3]
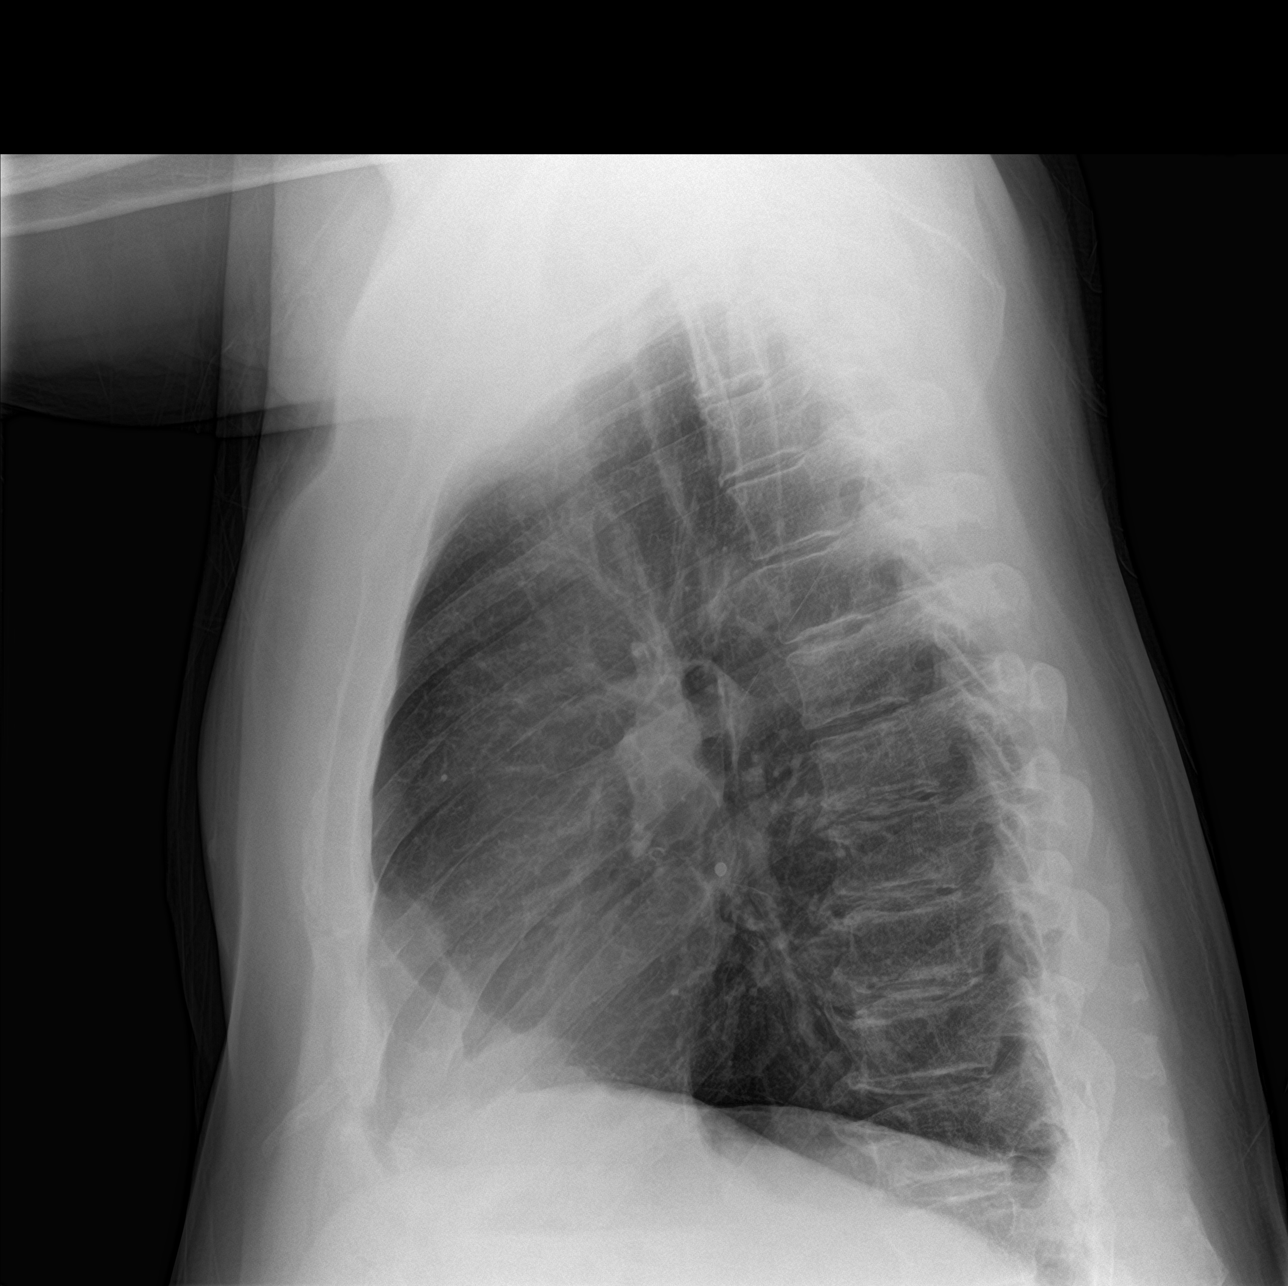

[3 of 3 positions shown; findings below may reference images not displayed]

FINDINGS: Mediastinum hilar structures normal. Lungs are clear. Mild bibasilar
subsegmental atelectasis. No pleural effusion or pneumothorax.
Thoracic spine degenerative change.
IMPRESSION: Mild bibasilar subsegmental atelectasis and/or scarring. Chest is
stable from 01/24/2012.

## 2019-09-20 IMAGING — DX DG HIP (WITH OR WITHOUT PELVIS) 2-3V*R*
2 series · 2 of 2 positions shown · non-contrast
Comparison: Right femur series of June 25, 2017

CLINICAL DATA: Status post right total hip joint prosthesis
placement.

EXAM:
DG HIP (WITH OR WITHOUT PELVIS) 2-3V RIGHT

[pelvis ap]
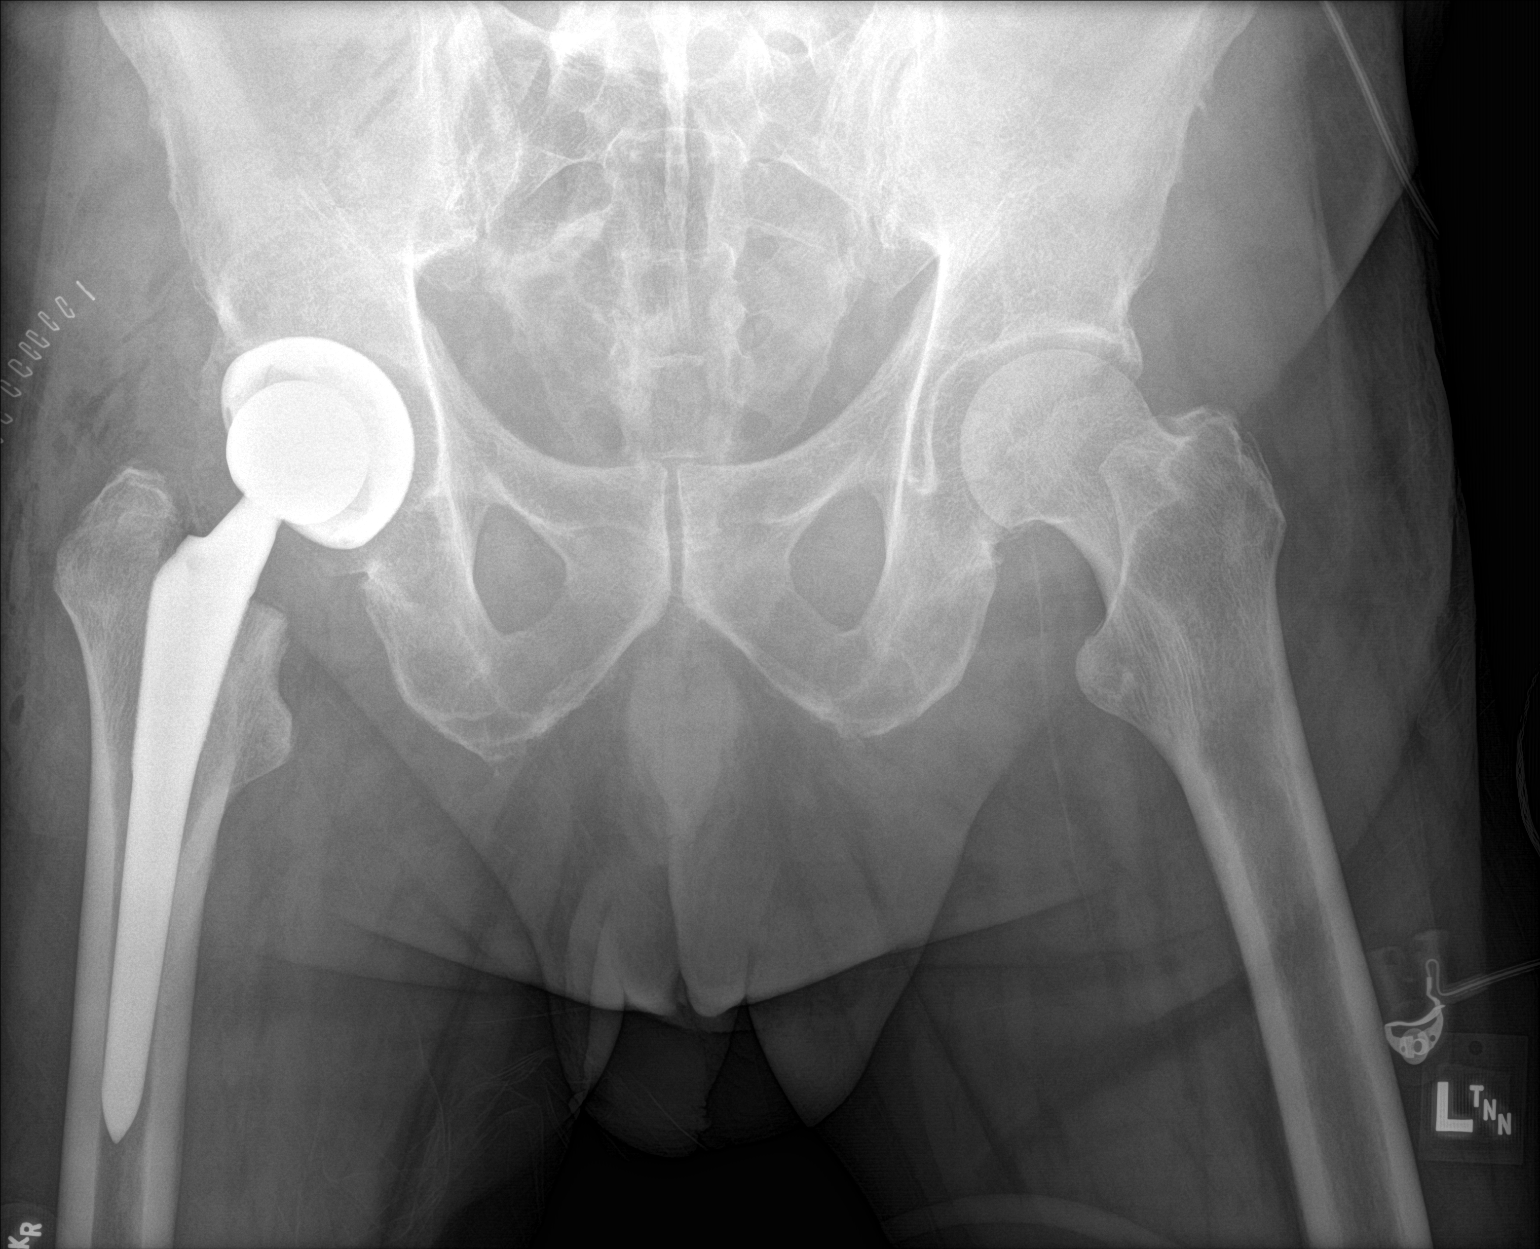

[hip lat]
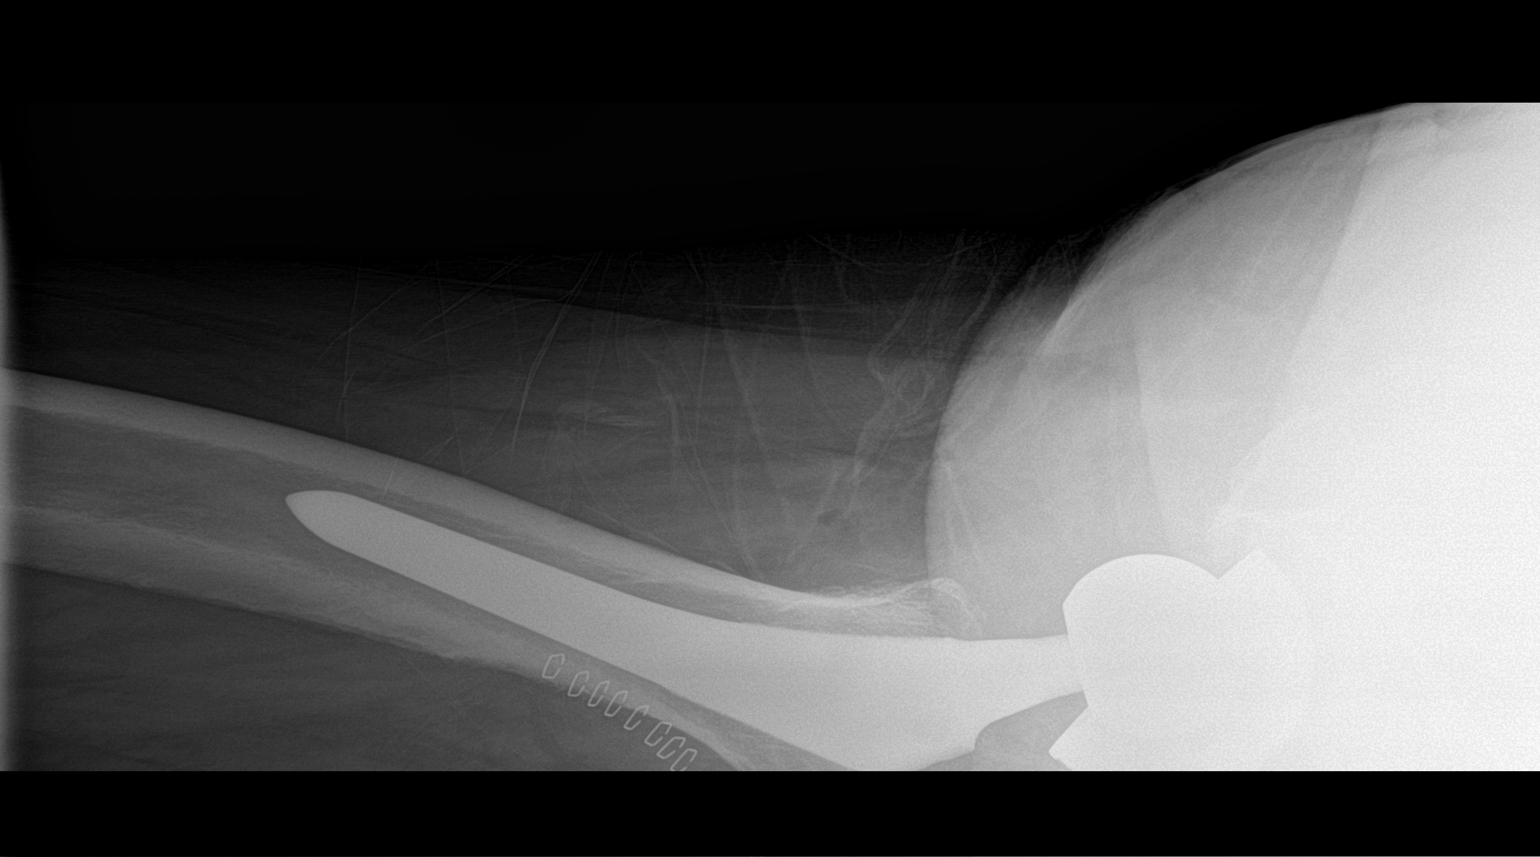

[2 of 2 positions shown; findings below may reference images not displayed]

FINDINGS: The patient has undergone right total hip joint prosthesis
placement. Radiographic positioning of the prosthetic components
appears good. The interface with the native bone is normal. There
surgical skin staples present.
IMPRESSION: No immediate postprocedure complication following right total hip
joint prosthesis placement.

## 2019-10-01 ENCOUNTER — Other Ambulatory Visit: Payer: Self-pay | Admitting: Family Medicine

## 2019-10-01 DIAGNOSIS — M19042 Primary osteoarthritis, left hand: Secondary | ICD-10-CM

## 2019-10-01 DIAGNOSIS — M19041 Primary osteoarthritis, right hand: Secondary | ICD-10-CM

## 2019-10-02 ENCOUNTER — Other Ambulatory Visit: Payer: Self-pay

## 2019-10-02 DIAGNOSIS — M19042 Primary osteoarthritis, left hand: Secondary | ICD-10-CM

## 2019-10-02 DIAGNOSIS — M19041 Primary osteoarthritis, right hand: Secondary | ICD-10-CM

## 2019-10-02 MED ORDER — DICLOFENAC SODIUM 75 MG PO TBEC: DELAYED_RELEASE_TABLET | ORAL | 2 refills | Status: DC

## 2019-10-02 NOTE — Progress Notes (Unsigned)
Sent in #30 diclofenac w/2 refills to [facility]

## 2019-11-12 ENCOUNTER — Encounter: Payer: Self-pay | Admitting: Family Medicine

## 2019-11-12 ENCOUNTER — Ambulatory Visit (INDEPENDENT_AMBULATORY_CARE_PROVIDER_SITE_OTHER): Payer: Medicare HMO | Admitting: Family Medicine

## 2019-11-12 ENCOUNTER — Other Ambulatory Visit: Payer: Self-pay

## 2019-11-12 VITALS — BP 124/62 | HR 64 | Ht 72.0 in | Wt 252.0 lb

## 2019-11-12 DIAGNOSIS — I10 Essential (primary) hypertension: Secondary | ICD-10-CM

## 2019-11-12 MED ORDER — AMLODIPINE BESY-BENAZEPRIL HCL 10-20 MG PO CAPS: ORAL_CAPSULE | ORAL | 1 refills | Status: DC

## 2019-11-12 MED ORDER — HYDROCHLOROTHIAZIDE 12.5 MG PO CAPS: ORAL_CAPSULE | ORAL | 1 refills | Status: DC

## 2019-11-12 MED ORDER — CARVEDILOL 6.25 MG PO TABS: ORAL_TABLET | ORAL | 1 refills | Status: DC

## 2019-11-12 NOTE — Progress Notes (Signed)
Date:  11/12/2019   Name:  Gary Kramer   DOB:  10/15/1946   MRN:  [id number]   Chief Complaint: Hypertension (had 2 elevated readings at home with son's b/p machine and his own)  Hypertension This is a chronic problem. The current episode started more than 1 year ago. The problem has been waxing and waning since onset. The problem is controlled. Pertinent negatives include no anxiety, blurred vision, chest pain, headaches, malaise/fatigue, neck pain, orthopnea, palpitations, peripheral edema, PND, shortness of breath or sweats. There are no associated agents to hypertension. Past treatments include ACE inhibitors and calcium channel blockers. The current treatment provides moderate improvement. There are no compliance problems.  There is no history of angina, kidney disease, CAD/MI, CVA, heart failure, left ventricular hypertrophy or PVD. There is no history of chronic renal disease, hyperaldosteronism or a hypertension causing med.    Lab Results  Component Value Date   CREATININE 1.58 (H) 07/04/2019   BUN 25 07/04/2019   NA 140 07/04/2019   K 4.6 07/04/2019   CL 104 07/04/2019   CO2 23 07/04/2019   Lab Results  Component Value Date   CHOL 158 07/04/2019   HDL 32 (L) 07/04/2019   LDLCALC 88 07/04/2019   TRIG 188 (H) 07/04/2019   CHOLHDL 4.1 07/16/2018   No results found for: TSH No results found for: HGBA1C   Review of Systems  Constitutional: Negative for chills, fever and malaise/fatigue.  HENT: Negative for drooling, ear discharge, ear pain, mouth sores, nosebleeds, postnasal drip, rhinorrhea, sinus pressure, sinus pain and sore throat.   Eyes: Negative for blurred vision.  Respiratory: Negative for cough, choking, chest tightness, shortness of breath and wheezing.   Cardiovascular: Negative for chest pain, palpitations, orthopnea, leg swelling and PND.  Gastrointestinal: Negative for abdominal pain, blood in stool, constipation, diarrhea and nausea.  Endocrine:  Negative for polydipsia.  Genitourinary: Negative for dysuria, frequency, hematuria and urgency.  Musculoskeletal: Negative for back pain, myalgias and neck pain.  Skin: Negative for rash.  Allergic/Immunologic: Negative for environmental allergies.  Neurological: Negative for dizziness and headaches.  Hematological: Does not bruise/bleed easily.  Psychiatric/Behavioral: Negative for suicidal ideas. The patient is not nervous/anxious.     Patient Active Problem List   Diagnosis Date Noted  . Obesity (BMI 30.0-34.9) 06/06/2018  . Status post total hip replacement, right 04/02/2018  . Presence of left artificial knee joint 01/24/2018  . Recurrent major depressive disorder, in partial remission ([place]) 01/16/2018  . Primary osteoarthritis of left knee 12/26/2017  . Chronic allergic rhinitis 01/05/2017  . Essential hypertension 07/20/2015  . Hyperlipidemia 07/20/2015  . Erectile dysfunction 07/20/2015  . Depression 07/20/2015  . Allergic rhinitis due to pollen 07/20/2015  . Degenerative joint disease of hand 07/20/2015    Allergies  Allergen Reactions  . Cefaclor Itching  . Penicillin V Potassium Rash    Has patient had a PCN reaction causing immediate rash, facial/tongue/throat swelling, SOB or lightheadedness with hypotension: No Has patient had a PCN reaction causing severe rash involving mucus membranes or skin necrosis: Unknown Has patient had a PCN reaction that required hospitalization: No Has patient had a PCN reaction occurring within the last 10 years: No If all of the above answers are "NO", then may proceed with Cephalosporin use.     Past Surgical History:  Procedure Laterality Date  . COLONOSCOPY  2016   cleared for 5 yrs- Dr Candace Cruise  . HERNIA REPAIR    . JOINT REPLACEMENT Left  12/2017   TKR  . 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   Procedure: TOTAL HIP ARTHROPLASTY;  Surgeon: Corky Mull, MD;  Location: [facility] ORS;  Service: Orthopedics;  Laterality: Right;     Social History   Tobacco Use  . Smoking status: Former Smoker    Packs/day: 1.50    Years: 30.00    Pack years: 45.00    Types: Cigarettes    Quit date: 1996    Years since quitting: 24.9  . Smokeless tobacco: Never Used  . Tobacco comment: smoking cessatioinn materials not required  Substance Use Topics  . Alcohol use: No    Alcohol/week: 0.0 standard drinks  . Drug use: No     Medication list has been reviewed and updated.  Current Meds  Medication Sig  . amLODipine-benazepril (LOTREL) 10-20 MG capsule TAKE ONE (1) CAPSULE EACH DAY.  Marland Kitchen Ascorbic Acid (VITAMIN C) 100 MG tablet Take 1,000 mg by mouth daily.   Marland Kitchen aspirin EC 81 MG tablet Take 81 mg by mouth daily.   Marland Kitchen atorvastatin (LIPITOR) 20 MG tablet Take 1 tablet (20 mg total) by mouth daily.  . carvedilol (COREG) 6.25 MG tablet TAKE (1) TABLET BY MOUTH TWICE DAILY  . cholecalciferol (VITAMIN D) 400 units TABS tablet Take 1,000 Units by mouth daily.   . diclofenac (VOLTAREN) 75 MG EC tablet TAKE (1) TABLET BY MOUTH EVERY DAY  . fenofibrate (TRICOR) 145 MG tablet TAKE 1 TABLET EVERY MORNING AT 6AM.  . fluticasone (FLONASE) 50 MCG/ACT nasal spray Place 1 spray into both nostrils daily as needed (for sinus/allergy issues.).  Marland Kitchen Garlic [id number] MG CAPS Take 1,000 mg by mouth daily.   . hydrochlorothiazide (MICROZIDE) 12.5 MG capsule TAKE (1) CAPSULE BY MOUTH EVERY DAY AT 6AM  . loratadine (CLARITIN) 10 MG tablet TAKE (1) TABLET BY MOUTH EVERY DAY  . Multiple Vitamin (MULTI-VITAMINS) TABS Take 1 tablet by mouth daily at 6 (six) AM.  . omega-3 acid ethyl esters (LOVAZA) 1 g capsule Take 1 capsule (1 g total) by mouth daily. otc  . sertraline (ZOLOFT) 50 MG tablet Take 1 tablet (50 mg total) by mouth daily at 6 (six) AM.    PHQ 2/9 Scores 11/12/2019 07/04/2019 01/08/2019 07/16/2018  PHQ - 2 Score 0 0 0 0  PHQ- 9 Score 0 0 - 0    BP Readings from Last 3 Encounters:  11/12/19 124/62  07/04/19 128/74  01/16/19 128/82    Physical  Exam Vitals and nursing note reviewed.  HENT:     Head: Normocephalic.     Right Ear: Tympanic membrane, ear canal and external ear normal.     Left Ear: Tympanic membrane, ear canal and external ear normal.     Nose: Nose normal. No congestion or rhinorrhea.     Mouth/Throat:     Mouth: Mucous membranes are moist.  Eyes:     General: No scleral icterus.       Right eye: No discharge.        Left eye: No discharge.     Conjunctiva/sclera: Conjunctivae normal.     Pupils: Pupils are equal, round, and reactive to light.  Neck:     Thyroid: No thyromegaly.     Vascular: No carotid bruit or JVD.     Trachea: No tracheal deviation.  Cardiovascular:     Rate and Rhythm: Normal rate and regular rhythm.     Heart sounds: Normal heart sounds. No murmur. No friction rub. No gallop.  Pulmonary:     Effort: Pulmonary effort is normal. No respiratory distress.     Breath sounds: Normal breath sounds. No stridor. No wheezing, rhonchi or rales.  Abdominal:     General: Bowel sounds are normal.     Palpations: Abdomen is soft. There is no mass.     Tenderness: There is no abdominal tenderness. There is no guarding or rebound.  Musculoskeletal:        General: No tenderness. Normal range of motion.     Cervical back: Normal range of motion and neck supple.  Lymphadenopathy:     Cervical: No cervical adenopathy.  Skin:    General: Skin is warm.     Findings: No rash.  Neurological:     Mental Status: He is alert and oriented to person, place, and time.     Cranial Nerves: No cranial nerve deficit.     Deep Tendon Reflexes: Reflexes are normal and symmetric.     Wt Readings from Last 3 Encounters:  11/12/19 252 lb (114.3 kg)  07/04/19 244 lb (110.7 kg)  01/16/19 241 lb (109.3 kg)    BP 124/62   Pulse 64   Ht 6' (1.829 m)   Wt 252 lb (114.3 kg)   SpO2 94%   BMI 34.18 kg/m   Assessment and Plan:   1. Essential hypertension Patient has taking his own blood pressure and  suboptimal circumstances and has had some elevated readings but only in roughly the 162 87 range.  Reassured patient that these are not significantly elevated in an unsustained situation and could be relative to his positioning or activity prior to taking exam.  We will continue his current regimen of amlodipine benazepril, hydrochlorothiazide, and carvedilol.  Patient was given information on the proper blood pressure taking technique and what needs to be done prior to taking out the pressure in terms of relaxation sitting in a chair and crossing legs and positioning of cough. - amLODipine-benazepril (LOTREL) 10-20 MG capsule; TAKE ONE (1) CAPSULE EACH DAY.  Dispense: 90 capsule; Refill: 1 - hydrochlorothiazide (MICROZIDE) 12.5 MG capsule; TAKE (1) CAPSULE BY MOUTH EVERY DAY AT 6AM  Dispense: 90 capsule; Refill: 1 - carvedilol (COREG) 6.25 MG tablet; TAKE (1) TABLET BY MOUTH TWICE DAILY  Dispense: 180 tablet; Refill: 1

## 2019-11-12 NOTE — Patient Instructions (Signed)
How to Take Your Blood Pressure You can take your blood pressure at home with a machine. You may need to check your blood pressure at home:  To check if you have high blood pressure (hypertension).  To check your blood pressure over time.  To make sure your blood pressure medicine is working. Supplies needed: You will need a blood pressure machine, or monitor. You can buy one at a drugstore or online. When choosing one:  Choose one with an arm cuff.  Choose one that wraps around your upper arm. Only one finger should fit between your arm and the cuff.  Do not choose one that measures your blood pressure from your wrist or finger. Your doctor can suggest a monitor. How to prepare Avoid these things for 30 minutes before checking your blood pressure:  Drinking caffeine.  Drinking alcohol.  Eating.  Smoking.  Exercising. Five minutes before checking your blood pressure:  Pee.  Sit in a dining chair. Avoid sitting in a soft couch or armchair.  Be quiet. Do not talk. How to take your blood pressure Follow the instructions that came with your machine. If you have a digital blood pressure monitor, these may be the instructions: 1. Sit up straight. 2. Place your feet on the floor. Do not cross your ankles or legs. 3. Rest your left arm at the level of your heart. You may rest it on a table, desk, or chair. 4. Pull up your shirt sleeve. 5. Wrap the blood pressure cuff around the upper part of your left arm. The cuff should be 1 inch (2.5 cm) above your elbow. It is best to wrap the cuff around bare skin. 6. Fit the cuff snugly around your arm. You should be able to place only one finger between the cuff and your arm. 7. Put the cord inside the groove of your elbow. 8. Press the power button. 9. Sit quietly while the cuff fills with air and loses air. 10. Write down the numbers on the screen. 11. Wait 2-3 minutes and then repeat steps 1-10. What do the numbers mean? Two  numbers make up your blood pressure. The first number is called systolic pressure. The second is called diastolic pressure. An example of a blood pressure reading is "120 over 80" (or 120/80). If you are an adult and do not have a medical condition, use this guide to find out if your blood pressure is normal: Normal  First number: below 120.  Second number: below 80. Elevated  First number: 120-129.  Second number: below 80. Hypertension stage 1  First number: 130-139.  Second number: 80-89. Hypertension stage 2  First number: 140 or above.  Second number: 90 or above. Your blood pressure is above normal even if only the top or bottom number is above normal. Follow these instructions at home:  Check your blood pressure as often as your doctor tells you to.  Take your monitor to your next doctor's appointment. Your doctor will: ? Make sure you are using it correctly. ? Make sure it is working right.  Make sure you understand what your blood pressure numbers should be.  Tell your doctor if your medicines are causing side effects. Contact a doctor if:  Your blood pressure keeps being high. Get help right away if:  Your first blood pressure number is higher than 180.  Your second blood pressure number is higher than 120. This information is not intended to replace advice given to you by your health   care provider. Make sure you discuss any questions you have with your health care provider. Document Released: 10/26/2008 Document Revised: 10/26/2017 Document Reviewed: 04/21/2016 Elsevier Patient Education  2020 [employer]  

## 2019-12-03 ENCOUNTER — Other Ambulatory Visit: Payer: Self-pay | Admitting: Family Medicine

## 2019-12-03 DIAGNOSIS — M19041 Primary osteoarthritis, right hand: Secondary | ICD-10-CM

## 2019-12-31 DIAGNOSIS — R69 Illness, unspecified: Secondary | ICD-10-CM | POA: Diagnosis not present

## 2020-01-05 ENCOUNTER — Other Ambulatory Visit: Payer: Self-pay

## 2020-01-05 ENCOUNTER — Encounter: Payer: Self-pay | Admitting: Family Medicine

## 2020-01-05 ENCOUNTER — Ambulatory Visit (INDEPENDENT_AMBULATORY_CARE_PROVIDER_SITE_OTHER): Payer: Medicare HMO | Admitting: Family Medicine

## 2020-01-05 VITALS — BP 120/62 | HR 76 | Ht 72.0 in | Wt 250.0 lb

## 2020-01-05 DIAGNOSIS — R69 Illness, unspecified: Secondary | ICD-10-CM | POA: Diagnosis not present

## 2020-01-05 DIAGNOSIS — E785 Hyperlipidemia, unspecified: Secondary | ICD-10-CM

## 2020-01-05 DIAGNOSIS — Z1211 Encounter for screening for malignant neoplasm of colon: Secondary | ICD-10-CM

## 2020-01-05 DIAGNOSIS — F3341 Major depressive disorder, recurrent, in partial remission: Secondary | ICD-10-CM

## 2020-01-05 DIAGNOSIS — I10 Essential (primary) hypertension: Secondary | ICD-10-CM | POA: Diagnosis not present

## 2020-01-05 DIAGNOSIS — M19042 Primary osteoarthritis, left hand: Secondary | ICD-10-CM

## 2020-01-05 DIAGNOSIS — J301 Allergic rhinitis due to pollen: Secondary | ICD-10-CM | POA: Diagnosis not present

## 2020-01-05 DIAGNOSIS — M19041 Primary osteoarthritis, right hand: Secondary | ICD-10-CM | POA: Diagnosis not present

## 2020-01-05 MED ORDER — AMLODIPINE BESY-BENAZEPRIL HCL 10-20 MG PO CAPS: ORAL_CAPSULE | ORAL | 1 refills | Status: DC

## 2020-01-05 MED ORDER — FLUTICASONE PROPIONATE 50 MCG/ACT NA SUSP: 1.0000 | Freq: Every day | NASAL | 5 refills | Status: DC | PRN

## 2020-01-05 MED ORDER — FENOFIBRATE 145 MG PO TABS: ORAL_TABLET | ORAL | 1 refills | Status: DC

## 2020-01-05 MED ORDER — SERTRALINE HCL 50 MG PO TABS: 50.0000 mg | ORAL_TABLET | Freq: Every day | ORAL | 1 refills | Status: DC

## 2020-01-05 MED ORDER — HYDROCHLOROTHIAZIDE 12.5 MG PO CAPS: ORAL_CAPSULE | ORAL | 1 refills | Status: DC

## 2020-01-05 MED ORDER — CARVEDILOL 6.25 MG PO TABS: ORAL_TABLET | ORAL | 1 refills | Status: DC

## 2020-01-05 MED ORDER — ATORVASTATIN CALCIUM 20 MG PO TABS: 20.0000 mg | ORAL_TABLET | Freq: Every day | ORAL | 1 refills | Status: DC

## 2020-01-05 MED ORDER — DICLOFENAC SODIUM 75 MG PO TBEC: DELAYED_RELEASE_TABLET | ORAL | 5 refills | Status: DC

## 2020-01-05 MED ORDER — LORATADINE 10 MG PO TABS: ORAL_TABLET | ORAL | 1 refills | Status: DC

## 2020-01-05 NOTE — Progress Notes (Signed)
Date:  01/05/2020   Name:  Gary Kramer   DOB:  12-04-1945   MRN:  [id number]   Chief Complaint: Allergic Rhinitis , Hypertension, Hyperlipidemia, Depression, and Knee Pain (takes voltaren)  Hypertension This is a chronic problem. The current episode started more than 1 year ago. The problem has been gradually improving since onset. The problem is controlled. Pertinent negatives include no anxiety, blurred vision, chest pain, headaches, malaise/fatigue, neck pain, orthopnea, palpitations, peripheral edema, PND, shortness of breath or sweats. There are no associated agents to hypertension. Risk factors for coronary artery disease include dyslipidemia. Past treatments include ACE inhibitors, calcium channel blockers, beta blockers and diuretics. The current treatment provides moderate improvement. There are no compliance problems.  There is no history of angina, kidney disease, CAD/MI, CVA, heart failure, left ventricular hypertrophy, PVD or retinopathy. There is no history of chronic renal disease, a hypertension causing med or renovascular disease.  Hyperlipidemia This is a chronic problem. The current episode started more than 1 year ago. The problem is controlled. Recent lipid tests were reviewed and are normal. He has no history of chronic renal disease, diabetes, hypothyroidism, liver disease, obesity or nephrotic syndrome. Factors aggravating his hyperlipidemia include beta blockers and thiazides. Pertinent negatives include no chest pain, focal sensory loss, focal weakness, leg pain, myalgias or shortness of breath. Current antihyperlipidemic treatment includes statins and fibric acid derivatives. The current treatment provides moderate improvement of lipids. There are no compliance problems.  Risk factors for coronary artery disease include dyslipidemia and hypertension.  Depression        This is a chronic problem.  The current episode started more than 1 year ago.   The problem occurs  daily.  The problem has been gradually improving since onset.  Associated symptoms include no decreased concentration, no fatigue, no helplessness, no hopelessness, does not have insomnia, not irritable, no restlessness, no decreased interest, no appetite change, no body aches, no myalgias, no headaches, no indigestion, not sad and no suicidal ideas.  Past treatments include SSRIs - Selective serotonin reuptake inhibitors.  Compliance with treatment is good.  Previous treatment provided significant relief.   Pertinent negatives include no hypothyroidism and no anxiety. Knee Pain  The incident occurred more than 1 week ago. There was no injury mechanism. The pain is present in the left knee and right hip. The pain is moderate. The pain has been intermittent since onset.    Lab Results  Component Value Date   CREATININE 1.58 (H) 07/04/2019   BUN 25 07/04/2019   NA 140 07/04/2019   K 4.6 07/04/2019   CL 104 07/04/2019   CO2 23 07/04/2019   Lab Results  Component Value Date   CHOL 158 07/04/2019   HDL 32 (L) 07/04/2019   LDLCALC 88 07/04/2019   TRIG 188 (H) 07/04/2019   CHOLHDL 4.1 07/16/2018   No results found for: TSH No results found for: HGBA1C   Review of Systems  Constitutional: Negative for appetite change, chills, fatigue, fever and malaise/fatigue.  HENT: Negative for drooling, ear discharge, ear pain and sore throat.   Eyes: Negative for blurred vision.  Respiratory: Negative for cough, shortness of breath and wheezing.   Cardiovascular: Negative for chest pain, palpitations, orthopnea, leg swelling and PND.  Gastrointestinal: Negative for abdominal pain, blood in stool, constipation, diarrhea and nausea.  Endocrine: Negative for polydipsia.  Genitourinary: Negative for dysuria, frequency, hematuria and urgency.  Musculoskeletal: Negative for back pain, myalgias and neck pain.  Skin:  Negative for rash.  Allergic/Immunologic: Negative for environmental allergies.    Neurological: Negative for dizziness, focal weakness and headaches.  Hematological: Does not bruise/bleed easily.  Psychiatric/Behavioral: Positive for depression. Negative for decreased concentration and suicidal ideas. The patient is not nervous/anxious and does not have insomnia.     Patient Active Problem List   Diagnosis Date Noted  . Obesity (BMI 30.0-34.9) 06/06/2018  . Status post total hip replacement, right 04/02/2018  . Presence of left artificial knee joint 01/24/2018  . Recurrent major depressive disorder, in partial remission ([place]) 01/16/2018  . Primary osteoarthritis of left knee 12/26/2017  . Chronic allergic rhinitis 01/05/2017  . Essential hypertension 07/20/2015  . Hyperlipidemia 07/20/2015  . Erectile dysfunction 07/20/2015  . Depression 07/20/2015  . Allergic rhinitis due to pollen 07/20/2015  . Degenerative joint disease of hand 07/20/2015    Allergies  Allergen Reactions  . Cefaclor Itching  . Penicillin V Potassium Rash    Has patient had a PCN reaction causing immediate rash, facial/tongue/throat swelling, SOB or lightheadedness with hypotension: No Has patient had a PCN reaction causing severe rash involving mucus membranes or skin necrosis: Unknown Has patient had a PCN reaction that required hospitalization: No Has patient had a PCN reaction occurring within the last 10 years: No If all of the above answers are "NO", then may proceed with Cephalosporin use.     Past Surgical History:  Procedure Laterality Date  . COLONOSCOPY  2016   cleared for 5 yrs- Dr Candace Cruise  . HERNIA REPAIR    . 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   Procedure: TOTAL HIP ARTHROPLASTY;  Surgeon: Corky Mull, MD;  Location: [facility] ORS;  Service: Orthopedics;  Laterality: Right;    Social History   Tobacco Use  . Smoking status: Former Smoker    Packs/day: 1.50    Years: 30.00    Pack years: 45.00    Types: Cigarettes    Quit date:  1996    Years since quitting: 25.1  . Smokeless tobacco: Never Used  . Tobacco comment: smoking cessatioinn materials not required  Substance Use Topics  . Alcohol use: No    Alcohol/week: 0.0 standard drinks  . Drug use: No     Medication list has been reviewed and updated.  Current Meds  Medication Sig  . amLODipine-benazepril (LOTREL) 10-20 MG capsule TAKE ONE (1) CAPSULE EACH DAY.  Marland Kitchen Ascorbic Acid (VITAMIN C) 100 MG tablet Take 1,000 mg by mouth daily.   Marland Kitchen aspirin EC 81 MG tablet Take 81 mg by mouth daily.   Marland Kitchen atorvastatin (LIPITOR) 20 MG tablet Take 1 tablet (20 mg total) by mouth daily.  . carvedilol (COREG) 6.25 MG tablet TAKE (1) TABLET BY MOUTH TWICE DAILY  . cholecalciferol (VITAMIN D) 400 units TABS tablet Take 1,000 Units by mouth daily.   . diclofenac (VOLTAREN) 75 MG EC tablet TAKE (1) TABLET BY MOUTH EVERY DAY  . fenofibrate (TRICOR) 145 MG tablet TAKE 1 TABLET EVERY MORNING AT 6AM.  . fluticasone (FLONASE) 50 MCG/ACT nasal spray Place 1 spray into both nostrils daily as needed (for sinus/allergy issues.).  Marland Kitchen Garlic [id number] MG CAPS Take 1,000 mg by mouth daily.   . hydrochlorothiazide (MICROZIDE) 12.5 MG capsule TAKE (1) CAPSULE BY MOUTH EVERY DAY AT 6AM  . loratadine (CLARITIN) 10 MG tablet TAKE (1) TABLET BY MOUTH EVERY DAY  . Multiple Vitamin (MULTI-VITAMINS) TABS Take 1 tablet by mouth daily at 6 (  six) AM.  . omega-3 acid ethyl esters (LOVAZA) 1 g capsule Take 1 capsule (1 g total) by mouth daily. otc  . sertraline (ZOLOFT) 50 MG tablet Take 1 tablet (50 mg total) by mouth daily at 6 (six) AM.    PHQ 2/9 Scores 01/05/2020 11/12/2019 07/04/2019 01/08/2019  PHQ - 2 Score 0 0 0 0  PHQ- 9 Score 0 0 0 -    BP Readings from Last 3 Encounters:  01/05/20 120/62  11/12/19 124/62  07/04/19 128/74    Physical Exam Vitals and nursing note reviewed.  Constitutional:      General: He is not irritable. HENT:     Head: Normocephalic.     Right Ear: Tympanic membrane, ear  canal and external ear normal.     Left Ear: Tympanic membrane, ear canal and external ear normal.     Nose: Nose normal. No congestion or rhinorrhea.     Mouth/Throat:     Mouth: Mucous membranes are moist.  Eyes:     General: No scleral icterus.       Right eye: No discharge.        Left eye: No discharge.     Extraocular Movements: Extraocular movements intact.     Conjunctiva/sclera: Conjunctivae normal.     Pupils: Pupils are equal, round, and reactive to light.  Neck:     Thyroid: No thyromegaly.     Vascular: No carotid bruit or JVD.     Trachea: No tracheal deviation.  Cardiovascular:     Rate and Rhythm: Normal rate and regular rhythm.     Heart sounds: Normal heart sounds. No murmur. No friction rub. No gallop.   Pulmonary:     Effort: No respiratory distress.     Breath sounds: Normal breath sounds. No wheezing, rhonchi or rales.  Chest:     Chest wall: No tenderness.  Abdominal:     General: Bowel sounds are normal.     Palpations: Abdomen is soft. There is no mass.     Tenderness: There is no abdominal tenderness. There is no guarding or rebound.  Musculoskeletal:        General: No tenderness. Normal range of motion.     Cervical back: Normal range of motion and neck supple. No rigidity or tenderness.  Lymphadenopathy:     Cervical: No cervical adenopathy.  Skin:    General: Skin is warm.     Capillary Refill: Capillary refill takes less than 2 seconds.     Findings: No rash.  Neurological:     Mental Status: He is alert and oriented to person, place, and time.     Cranial Nerves: No cranial nerve deficit.     Sensory: No sensory deficit.     Deep Tendon Reflexes: Reflexes are normal and symmetric.     Wt Readings from Last 3 Encounters:  01/05/20 250 lb (113.4 kg)  11/12/19 252 lb (114.3 kg)  07/04/19 244 lb (110.7 kg)    BP 120/62   Pulse 76   Ht 6' (1.829 m)   Wt 250 lb (113.4 kg)   BMI 33.91 kg/m   Assessment and Plan:  1. Essential  hypertension Chronic.  Controlled.  Stable.  Continue amlodipine benazepril 10-20 mg and hydrochlorothiazide 12.5 mg once a day and carvedilol 6.25 mg 1 twice a day.  Will check renal function panel to assess GFR. - amLODipine-benazepril (LOTREL) 10-20 MG capsule; TAKE ONE (1) CAPSULE EACH DAY.  Dispense: 90 capsule; Refill: 1 -  carvedilol (COREG) 6.25 MG tablet; TAKE (1) TABLET BY MOUTH TWICE DAILY  Dispense: 180 tablet; Refill: 1 - hydrochlorothiazide (MICROZIDE) 12.5 MG capsule; TAKE (1) CAPSULE BY MOUTH EVERY DAY AT 6AM  Dispense: 90 capsule; Refill: 1 - Renal Function Panel  2. Hyperlipidemia, unspecified hyperlipidemia type Chronic.  Controlled.  Stable.  Continue atorvastatin 20 mg once a day and fenofibrate 145 mg once a day. - atorvastatin (LIPITOR) 20 MG tablet; Take 1 tablet (20 mg total) by mouth daily.  Dispense: 90 tablet; Refill: 1 - fenofibrate (TRICOR) 145 MG tablet; TAKE 1 TABLET EVERY MORNING AT 6AM.  Dispense: 90 tablet; Refill: 1  3. Osteoarthritis of both hands, unspecified osteoarthritis type Chronic.  Controlled.  Stable.  Patient has osteoarthritis of both hands as well as in the hips and knees.  Patient will continue on Voltaren 75 mg once a day. - diclofenac (VOLTAREN) 75 MG EC tablet; One tablet qday  Dispense: 30 tablet; Refill: 5  4. Seasonal allergic rhinitis due to pollen Chronic.  Episodic.  Stable.  This is seasonal in nature and responds to daily loratadine 10 mg once a day as well as fluticasone nasal spray 1 spray in both nostrils once a day. - fluticasone (FLONASE) 50 MCG/ACT nasal spray; Place 1 spray into both nostrils daily as needed (for sinus/allergy issues.).  Dispense: 16 g; Refill: 5 - loratadine (CLARITIN) 10 MG tablet; TAKE (1) TABLET BY MOUTH EVERY DAY  Dispense: 90 tablet; Refill: 1  5. Recurrent major depressive disorder, in partial remission (HCC) Chronic.  Controlled.  Stable.  PHQ was noted to be 0 as well as the gad score was 0.  We will  continue sertraline 50 mg once a day.  Will recheck in 6 months. - sertraline (ZOLOFT) 50 MG tablet; Take 1 tablet (50 mg total) by mouth daily at 6 (six) AM.  Dispense: 90 tablet; Refill: 1  6. Colon cancer screening Discussed and patient will undergo referral to gastroenterology for 5-year follow-up for hyperplastic polyp. - Ambulatory referral to Gastroenterology

## 2020-01-06 LAB — RENAL FUNCTION PANEL
Albumin: 4.5 g/dL (ref 3.7–4.7)
BUN/Creatinine Ratio: 16 (ref 10–24)
BUN: 26 mg/dL (ref 8–27)
CO2: 24 mmol/L (ref 20–29)
Calcium: 9.9 mg/dL (ref 8.6–10.2)
Chloride: 106 mmol/L (ref 96–106)
Creatinine, Ser: 1.63 mg/dL — ABNORMAL HIGH (ref 0.76–1.27)
GFR calc Af Amer: 48 mL/min/{1.73_m2} — ABNORMAL LOW (ref >59)
GFR calc non Af Amer: 41 mL/min/{1.73_m2} — ABNORMAL LOW (ref >59)
Glucose: 127 mg/dL — ABNORMAL HIGH (ref 65–99)
Phosphorus: 3.5 mg/dL (ref 2.8–4.1)
Potassium: 5 mmol/L (ref 3.5–5.2)
Sodium: 143 mmol/L (ref 134–144)

## 2020-01-08 DIAGNOSIS — I10 Essential (primary) hypertension: Secondary | ICD-10-CM | POA: Diagnosis not present

## 2020-01-08 DIAGNOSIS — N1831 Chronic kidney disease, stage 3a: Secondary | ICD-10-CM | POA: Diagnosis not present

## 2020-01-12 ENCOUNTER — Telehealth: Payer: Self-pay

## 2020-01-12 ENCOUNTER — Other Ambulatory Visit: Payer: Self-pay

## 2020-01-12 ENCOUNTER — Ambulatory Visit (INDEPENDENT_AMBULATORY_CARE_PROVIDER_SITE_OTHER): Payer: Medicare HMO

## 2020-01-12 VITALS — BP 120/70 | HR 77 | Resp 16 | Ht 72.0 in | Wt 251.4 lb

## 2020-01-12 DIAGNOSIS — Z0000 Encounter for general adult medical examination without abnormal findings: Secondary | ICD-10-CM

## 2020-01-12 DIAGNOSIS — Z86010 Personal history of colonic polyps: Secondary | ICD-10-CM

## 2020-01-12 DIAGNOSIS — Z1211 Encounter for screening for malignant neoplasm of colon: Secondary | ICD-10-CM

## 2020-01-12 MED ORDER — NA SULFATE-K SULFATE-MG SULF 17.5-3.13-1.6 GM/177ML PO SOLN: 354.0000 mL | Freq: Once | ORAL | 0 refills | Status: AC

## 2020-01-12 NOTE — Progress Notes (Signed)
Subjective:   Gary Kramer is a 74 y.o. male who presents for Medicare Annual/Subsequent preventive examination.  Review of Systems:   Cardiac Risk Factors include: advanced age (>70men, >25 women);obesity (BMI >30kg/m2);hypertension;male gender;dyslipidemia     Objective:    Vitals: BP 120/70 (BP Location: Right Arm, Patient Position: Sitting, Cuff Size: Large)   Pulse 77   Resp 16   Ht 6' (1.829 m)   Wt 251 lb 6.4 oz (114 kg)   SpO2 94%   BMI 34.10 kg/m   Body mass index is 34.1 kg/m.  Advanced Directives 01/12/2020 01/08/2019 04/02/2018 03/20/2018 01/07/2018  Does Patient Have a Medical Advance Directive? Yes Yes Yes Yes No  Type of Paramedic of [place];Living will [place];Living will [place];Living will [place];Living will -  Does patient want to make changes to medical advance directive? - - No - Patient declined - -  Copy of [place] in Chart? Yes - validated most recent copy scanned in chart (See row information) Yes - validated most recent copy scanned in chart (See row information) Yes No - copy requested -  Would patient like information on creating a medical advance directive? - - - - Yes (MAU/Ambulatory/Procedural Areas - Information given)    Tobacco Social History   Tobacco Use  Smoking Status Former Smoker  . Packs/day: 1.50  . Years: 30.00  . Pack years: 45.00  . Types: Cigarettes  . Quit date: 62  . Years since quitting: 25.1  Smokeless Tobacco Never Used  Tobacco Comment   smoking cessatioinn materials not required     Counseling given: Not Answered Comment: smoking cessatioinn materials not required   Clinical Intake:  Pre-visit preparation completed: Yes  Pain : No/denies pain     BMI - recorded: 34.1 Nutritional Status: BMI > 30  Obese Nutritional Risks: None Diabetes: No  How often do you need to have someone help you  when you read instructions, pamphlets, or other written materials from your doctor or pharmacy?: 1 - Never  Interpreter Needed?: No  Information entered by :: Clemetine Marker LPN  Past Medical History:  Diagnosis Date  . Allergy   . Arthritis   . Depression   . Erectile dysfunction   . Hyperlipidemia   . Hypertension    Past Surgical History:  Procedure Laterality Date  . COLONOSCOPY  2016   cleared for 5 yrs- Dr Candace Cruise  . HERNIA REPAIR    . 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 Cancer Mother        brain  . Stroke Father    Social History   Socioeconomic History  . Marital status: Widowed    Spouse name: Not on file  . Number of children: 2  . Years of education: Not on file  . Highest education level: Bachelor's degree (e.g., BA, AB, BS)  Occupational History  . Occupation: Retired  Tobacco Use  . Smoking status: Former Smoker    Packs/day: 1.50    Years: 30.00    Pack years: 45.00    Types: Cigarettes    Quit date: 1996    Years since quitting: 25.1  . Smokeless tobacco: Never Used  .  Tobacco comment: smoking cessatioinn materials not required  Substance and Sexual Activity  . Alcohol use: Yes    Alcohol/week: 0.0 standard drinks    Comment: only occasionally  . Drug use: No  . Sexual activity: Not Currently  Other Topics Concern  . Not on file  Social History Narrative  . Not on file   Social Determinants of Health   Financial Resource Strain: Low Risk   . Difficulty of Paying Living Expenses: Not hard at all  Food Insecurity: No Food Insecurity  . Worried About Charity fundraiser in the Last Year: Never true  . Ran Out of Food in the Last Year: Never true  Transportation Needs: No Transportation Needs  . Lack of Transportation  (Medical): No  . Lack of Transportation (Non-Medical): No  Physical Activity: Inactive  . Days of Exercise per Week: 0 days  . Minutes of Exercise per Session: 0 min  Stress: No Stress Concern Present  . Feeling of Stress : Only a little  Social Connections: Moderately Isolated  . Frequency of Communication with Friends and Family: More than three times a week  . Frequency of Social Gatherings with Friends and Family: Once a week  . Attends Religious Services: Never  . Active Member of Clubs or Organizations: No  . Attends Archivist Meetings: Never  . Marital Status: Widowed    Outpatient Encounter Medications as of 01/12/2020  Medication Sig  . amLODipine-benazepril (LOTREL) 10-20 MG capsule TAKE ONE (1) CAPSULE EACH DAY.  Marland Kitchen Ascorbic Acid (VITAMIN C) 100 MG tablet Take 1,000 mg by mouth daily.   Marland Kitchen aspirin EC 81 MG tablet Take 81 mg by mouth daily.   Marland Kitchen atorvastatin (LIPITOR) 20 MG tablet Take 1 tablet (20 mg total) by mouth daily.  . carvedilol (COREG) 6.25 MG tablet TAKE (1) TABLET BY MOUTH TWICE DAILY  . cholecalciferol (VITAMIN D) 400 units TABS tablet Take 1,000 Units by mouth daily.   . diclofenac (VOLTAREN) 75 MG EC tablet One tablet qday  . diclofenac Sodium (VOLTAREN) 1 % GEL Apply topically. PRN  . fenofibrate (TRICOR) 145 MG tablet TAKE 1 TABLET EVERY MORNING AT 6AM.  . fluticasone (FLONASE) 50 MCG/ACT nasal spray Place 1 spray into both nostrils daily as needed (for sinus/allergy issues.).  Marland Kitchen Garlic [id number] MG CAPS Take 1,000 mg by mouth daily.   . hydrochlorothiazide (MICROZIDE) 12.5 MG capsule TAKE (1) CAPSULE BY MOUTH EVERY DAY AT 6AM  . loratadine (CLARITIN) 10 MG tablet TAKE (1) TABLET BY MOUTH EVERY DAY  . Multiple Vitamin (MULTI-VITAMINS) TABS Take 1 tablet by mouth daily at 6 (six) AM.  . Na Sulfate-K Sulfate-Mg Sulf 17.5-3.13-1.6 GM/177ML SOLN Take 354 mLs by mouth once for 1 dose.  . Omega-3 Fatty Acids (FISH OIL MAXIMUM STRENGTH) 1200 MG CPDR   .  sertraline (ZOLOFT) 50 MG tablet Take 1 tablet (50 mg total) by mouth daily at 6 (six) AM.  . [DISCONTINUED] omega-3 acid ethyl esters (LOVAZA) 1 g capsule Take 1 capsule (1 g total) by mouth daily. otc   No facility-administered encounter medications on file as of 01/12/2020.    Activities of Daily Living In your present state of health, do you have any difficulty performing the following activities: 01/12/2020  Hearing? N  Comment declines hearing aids  Vision? N  Difficulty concentrating or making decisions? N  Walking or climbing stairs? N  Dressing or bathing? N  Doing errands, shopping? N  Preparing Food and eating ? N  Using the Toilet? N  In the past six months, have you accidently leaked urine? N  Do you have problems with loss of bowel control? N  Managing your Medications? N  Managing your Finances? N  Housekeeping or managing your Housekeeping? N  Some recent data might be hidden    Patient Care Team: Juline Patch, MD as PCP - General (Family Medicine) Reche Dixon, PA-C as Consulting Physician (Orthopedic Surgery) Leanor Kail, MD (Inactive) as Consulting Physician (Orthopedic Surgery)   Assessment:   This is a routine wellness examination for [place].  Exercise Activities and Dietary recommendations Current Exercise Habits: The patient does not participate in regular exercise at present, Exercise limited by: None identified  Goals    . DIET - INCREASE WATER INTAKE     Recommend to drink at least 6-8 8oz glasses of water per day.    . Increase physical activity     Pt would like to increase physical activity over the next year as tolerated.       Fall Risk Fall Risk  01/12/2020 01/05/2020 11/12/2019 01/08/2019 01/07/2018  Falls in the past year? 1 0 1 1 No  Number falls in past yr: 1 - 0 1 -  Comment tripped on flower pot in the dark - - - -  Injury with Fall? 0 - 0 0 -  Risk for fall due to : No Fall Risks - - History of fall(s) Impaired  balance/gait;Impaired mobility;Medication side effect  Risk for fall due to: Comment - - - fell prior to knee and hip replacement Left knee and Right hip pain. Scheduled for surgery  Follow up Falls prevention discussed Falls evaluation completed Falls evaluation completed Falls prevention discussed -   FALL RISK PREVENTION PERTAINING TO THE HOME:  Any stairs in or around the home? Yes  If so, do they handrails? Yes   Home free of loose throw rugs in walkways, pet beds, electrical cords, etc? Yes  Adequate lighting in your home to reduce risk of falls? Yes   ASSISTIVE DEVICES UTILIZED TO PREVENT FALLS:  Life alert? No  Use of a cane, walker or w/c? No  Grab bars in the bathroom? Yes  Shower chair or bench in shower? No  Elevated toilet seat or a handicapped toilet? No   DME ORDERS:  DME order needed?  No   TIMED UP AND GO:  Was the test performed? Yes .  Length of time to ambulate 10 feet: 5 sec.   GAIT:  Appearance of gait: Gait stead-fast and without the use of an assistive device.    Education: Fall risk prevention has been discussed.  Intervention(s) required? No    Depression Screen PHQ 2/9 Scores 01/12/2020 01/05/2020 11/12/2019 07/04/2019  PHQ - 2 Score 0 0 0 0  PHQ- 9 Score - 0 0 0    Cognitive Function     6CIT Screen 01/12/2020 01/08/2019 01/07/2018 01/05/2017  What Year? 0 points 0 points 0 points 0 points  What month? 0 points 0 points 0 points 0 points  What time? 0 points 0 points 0 points 0 points  Count back from 20 0 points 0 points 0 points 0 points  Months in reverse 0 points 0 points 0 points 0 points  Repeat phrase 0 points 0 points 0 points 0 points  Total Score 0 0 0 0    Immunization History  Administered Date(s) Administered  . Fluad Quad(high Dose 65+) 08/05/2019  . Influenza, High Dose Seasonal PF  08/17/2017, 08/05/2018  . Influenza,inj,Quad PF,6+ Mos 09/13/2015, 08/28/2016  . Pneumococcal Conjugate-13 09/18/2014  . Pneumococcal  Polysaccharide-23 12/28/2012, 01/07/2018  . Tdap 01/05/2017  . Zoster Recombinat (Shingrix) 02/08/2017, 06/13/2017    Qualifies for Shingles Vaccine? Yes  Shingrix series completed 2018.  Tdap: Up to date  Flu Vaccine: Up to date  Pneumococcal Vaccine: Up to date   Screening Tests Health Maintenance  Topic Date Due  . Hepatitis C Screening  07/03/2020 (Originally 04-Mar-1946)  . COLONOSCOPY  01/04/2021 (Originally 12/29/2019)  . TETANUS/TDAP  01/05/2027  . INFLUENZA VACCINE  Completed  . PNA vac Low Risk Adult  Completed   Cancer Screenings:  Colorectal Screening: Completed 03/11/15. Repeat every 5 years; Scheduled for 03/10/20.   Lung Cancer Screening: (Low Dose CT Chest recommended if Age 18-80 years, 30 pack-year currently smoking OR have quit w/in 15years.) does not qualify.   Additional Screening:  Hepatitis C Screening: does qualify; postponed  Vision Screening: Recommended annual ophthalmology exams for early detection of glaucoma and other disorders of the eye. Is the patient up to date with their annual eye exam?  No  Who is the provider or what is the name of the office in which the pt attends annual eye exams? Not established If pt is not established with a provider, would they like to be referred to a provider to establish care? No .   Dental Screening: Recommended annual dental exams for proper oral hygiene  Community Resource Referral:  CRR required this visit?  No       Plan:    I have personally reviewed and addressed the Medicare Annual Wellness questionnaire and have noted the following in the patient's chart:  A. Medical and social history B. Use of alcohol, tobacco or illicit drugs  C. Current medications and supplements D. Functional ability and status E.  Nutritional status F.  Physical activity G. Advance directives H. List of other physicians I.  Hospitalizations, surgeries, and ER visits in previous 12 months J.  [place] such as  hearing and vision if needed, cognitive and depression L. Referrals and appointments   In addition, I have reviewed and discussed with patient certain preventive protocols, quality metrics, and best practice recommendations. A written personalized care plan for preventive services as well as general preventive health recommendations were provided to patient.   Signed,  Clemetine Marker, LPN Nurse Health Advisor   Nurse Notes: pt doing well and appreciative of visit today

## 2020-01-12 NOTE — Telephone Encounter (Signed)
Gastroenterology Pre-Procedure Review    PATIENT REVIEW QUESTIONS: The patient responded to the following health history questions as indicated:    1. Are you having any GI issues? no 2. Do you have a personal history of Polyps? yes 3. Do you have a family history of Colon Cancer or Polyps? no 4. Diabetes Mellitus? no 5. Joint replacements in the past 12 months?no 6. Major health problems in the past 3 months?no 7. Any artificial heart valves, MVP, or defibrillator?no    MEDICATIONS & ALLERGIES:    Patient reports the following regarding taking any anticoagulation/antiplatelet therapy:   Plavix, Coumadin, Eliquis, Xarelto, Lovenox, Pradaxa, Brilinta, or Effient? no Aspirin? Yes 81mg    Patient confirms/reports the following medications:  Current Outpatient Medications  Medication Sig Dispense Refill  . amLODipine-benazepril (LOTREL) 10-20 MG capsule TAKE ONE (1) CAPSULE EACH DAY. 90 capsule 1  . Ascorbic Acid (VITAMIN C) 100 MG tablet Take 1,000 mg by mouth daily.     Marland Kitchen aspirin EC 81 MG tablet Take 81 mg by mouth daily.     Marland Kitchen atorvastatin (LIPITOR) 20 MG tablet Take 1 tablet (20 mg total) by mouth daily. 90 tablet 1  . carvedilol (COREG) 6.25 MG tablet TAKE (1) TABLET BY MOUTH TWICE DAILY 180 tablet 1  . cholecalciferol (VITAMIN D) 400 units TABS tablet Take 1,000 Units by mouth daily.     . diclofenac (VOLTAREN) 75 MG EC tablet One tablet qday 30 tablet 5  . fenofibrate (TRICOR) 145 MG tablet TAKE 1 TABLET EVERY MORNING AT 6AM. 90 tablet 1  . fluticasone (FLONASE) 50 MCG/ACT nasal spray Place 1 spray into both nostrils daily as needed (for sinus/allergy issues.). 16 g 5  . Garlic [id number] MG CAPS Take 1,000 mg by mouth daily.     . hydrochlorothiazide (MICROZIDE) 12.5 MG capsule TAKE (1) CAPSULE BY MOUTH EVERY DAY AT 6AM 90 capsule 1  . loratadine (CLARITIN) 10 MG tablet TAKE (1) TABLET BY MOUTH EVERY DAY 90 tablet 1  . Multiple Vitamin (MULTI-VITAMINS) TABS Take 1 tablet by mouth daily  at 6 (six) AM.    . omega-3 acid ethyl esters (LOVAZA) 1 g capsule Take 1 capsule (1 g total) by mouth daily. otc 100 capsule 3  . sertraline (ZOLOFT) 50 MG tablet Take 1 tablet (50 mg total) by mouth daily at 6 (six) AM. 90 tablet 1   No current facility-administered medications for this visit.    Patient confirms/reports the following allergies:  Allergies  Allergen Reactions  . Cefaclor Itching  . Penicillin V Potassium Rash    Has patient had a PCN reaction causing immediate rash, facial/tongue/throat swelling, SOB or lightheadedness with hypotension: No Has patient had a PCN reaction causing severe rash involving mucus membranes or skin necrosis: Unknown Has patient had a PCN reaction that required hospitalization: No Has patient had a PCN reaction occurring within the last 10 years: No If all of the above answers are "NO", then may proceed with Cephalosporin use.     No orders of the defined types were placed in this encounter.   AUTHORIZATION INFORMATION Primary Insurance: 1D#: Group #:  Secondary Insurance: 1D#: Group #:  SCHEDULE INFORMATION: Date: 03/10/2020 Time: Location:[facility]

## 2020-01-12 NOTE — Patient Instructions (Addendum)
Gary Kramer , Thank you for taking time to come for your Medicare Wellness Visit. I appreciate your ongoing commitment to your health goals. Please review the following plan we discussed and let me know if I can assist you in the future.   Screening recommendations/referrals: Colonoscopy: done 03/11/15. Scheduled for 03/10/20. Recommended yearly ophthalmology/optometry visit for glaucoma screening and checkup Recommended yearly dental visit for hygiene and checkup  Vaccinations: Influenza vaccine: done 08/05/19 Pneumococcal vaccine: done 01/07/18 Tdap vaccine: done 01/05/17 Shingles vaccine: Shingrix series completed 2018    Covid vaccine appointment information:  [facility] [facility] [phone]  Conditions/risks identified: Recommend increasing physical activity.   Next appointment: Please follow up in one year for your Medicare Annual Wellness visit.    Preventive Care 72 Years and Older, Male Preventive care refers to lifestyle choices and visits with your health care provider that can promote health and wellness. What does preventive care include?  A yearly physical exam. This is also called an annual well check.  Dental exams once or twice a year.  Routine eye exams. Ask your health care provider how often you should have your eyes checked.  Personal lifestyle choices, including:  Daily care of your teeth and gums.  Regular physical activity.  Eating a healthy diet.  Avoiding tobacco and drug use.  Limiting alcohol use.  Practicing safe sex.  Taking low doses of aspirin every day.  Taking vitamin and mineral supplements as recommended by your health care provider. What happens during an annual well check? The services and screenings done by your health care provider during your annual well check will depend on your age, overall health, lifestyle risk factors, and family history of disease. Counseling  Your health care  provider may ask you questions about your:  Alcohol use.  Tobacco use.  Drug use.  Emotional well-being.  Home and relationship well-being.  Sexual activity.  Eating habits.  History of falls.  Memory and ability to understand (cognition).  Work and work Statistician. Screening  You may have the following tests or measurements:  Height, weight, and BMI.  Blood pressure.  Lipid and cholesterol levels. These may be checked every 5 years, or more frequently if you are over 72 years old.  Skin check.  Lung cancer screening. You may have this screening every year starting at age 55 if you have a 30-pack-year history of smoking and currently smoke or have quit within the past 15 years.  Fecal occult blood test (FOBT) of the stool. You may have this test every year starting at age 72.  Flexible sigmoidoscopy or colonoscopy. You may have a sigmoidoscopy every 5 years or a colonoscopy every 10 years starting at age 72.  Prostate cancer screening. Recommendations will vary depending on your family history and other risks.  Hepatitis C blood test.  Hepatitis B blood test.  Sexually transmitted disease (STD) testing.  Diabetes screening. This is done by checking your blood sugar (glucose) after you have not eaten for a while (fasting). You may have this done every 1-3 years.  Abdominal aortic aneurysm (AAA) screening. You may need this if you are a current or former smoker.  Osteoporosis. You may be screened starting at age 72 if you are at high risk. Talk with your health care provider about your test results, treatment options, and if necessary, the need for more tests. Vaccines  Your health care provider may recommend certain vaccines, such as:  Influenza vaccine. This is recommended  every year.  Tetanus, diphtheria, and acellular pertussis (Tdap, Td) vaccine. You may need a Td booster every 10 years.  Zoster vaccine. You may need this after age 72.  Pneumococcal  13-valent conjugate (PCV13) vaccine. One dose is recommended after age 72.  Pneumococcal polysaccharide (PPSV23) vaccine. One dose is recommended after age 72. Talk to your health care provider about which screenings and vaccines you need and how often you need them. This information is not intended to replace advice given to you by your health care provider. Make sure you discuss any questions you have with your health care provider. Document Released: 12/10/2015 Document Revised: 08/02/2016 Document Reviewed: 09/14/2015 Elsevier Interactive Patient Education  2017 [place] Prevention in the Home Falls can cause injuries. They can happen to people of all ages. There are many things you can do to make your home safe and to help prevent falls. What can I do on the outside of my home?  Regularly fix the edges of walkways and driveways and fix any cracks.  Remove anything that might make you trip as you walk through a door, such as a raised step or threshold.  Trim any bushes or trees on the path to your home.  Use bright outdoor lighting.  Clear any walking paths of anything that might make someone trip, such as rocks or tools.  Regularly check to see if handrails are loose or broken. Make sure that both sides of any steps have handrails.  Any raised decks and porches should have guardrails on the edges.  Have any leaves, snow, or ice cleared regularly.  Use sand or salt on walking paths during winter.  Clean up any spills in your garage right away. This includes oil or grease spills. What can I do in the bathroom?  Use night lights.  Install grab bars by the toilet and in the tub and shower. Do not use towel bars as grab bars.  Use non-skid mats or decals in the tub or shower.  If you need to sit down in the shower, use a plastic, non-slip stool.  Keep the floor dry. Clean up any water that spills on the floor as soon as it happens.  Remove soap buildup in the  tub or shower regularly.  Attach bath mats securely with double-sided non-slip rug tape.  Do not have throw rugs and other things on the floor that can make you trip. What can I do in the bedroom?  Use night lights.  Make sure that you have a light by your bed that is easy to reach.  Do not use any sheets or blankets that are too big for your bed. They should not hang down onto the floor.  Have a firm chair that has side arms. You can use this for support while you get dressed.  Do not have throw rugs and other things on the floor that can make you trip. What can I do in the kitchen?  Clean up any spills right away.  Avoid walking on wet floors.  Keep items that you use a lot in easy-to-reach places.  If you need to reach something above you, use a strong step stool that has a grab bar.  Keep electrical cords out of the way.  Do not use floor polish or wax that makes floors slippery. If you must use wax, use non-skid floor wax.  Do not have throw rugs and other things on the floor that can make you trip. What  can I do with my stairs?  Do not leave any items on the stairs.  Make sure that there are handrails on both sides of the stairs and use them. Fix handrails that are broken or loose. Make sure that handrails are as long as the stairways.  Check any carpeting to make sure that it is firmly attached to the stairs. Fix any carpet that is loose or worn.  Avoid having throw rugs at the top or bottom of the stairs. If you do have throw rugs, attach them to the floor with carpet tape.  Make sure that you have a light switch at the top of the stairs and the bottom of the stairs. If you do not have them, ask someone to add them for you. What else can I do to help prevent falls?  Wear shoes that:  Do not have high heels.  Have rubber bottoms.  Are comfortable and fit you well.  Are closed at the toe. Do not wear sandals.  If you use a stepladder:  Make sure that it  is fully opened. Do not climb a closed stepladder.  Make sure that both sides of the stepladder are locked into place.  Ask someone to hold it for you, if possible.  Clearly mark and make sure that you can see:  Any grab bars or handrails.  First and last steps.  Where the edge of each step is.  Use tools that help you move around (mobility aids) if they are needed. These include:  Canes.  Walkers.  Scooters.  Crutches.  Turn on the lights when you go into a dark area. Replace any light bulbs as soon as they burn out.  Set up your furniture so you have a clear path. Avoid moving your furniture around.  If any of your floors are uneven, fix them.  If there are any pets around you, be aware of where they are.  Review your medicines with your doctor. Some medicines can make you feel dizzy. This can increase your chance of falling. Ask your doctor what other things that you can do to help prevent falls. This information is not intended to replace advice given to you by your health care provider. Make sure you discuss any questions you have with your health care provider. Document Released: 09/09/2009 Document Revised: 04/20/2016 Document Reviewed: 12/18/2014 Elsevier Interactive Patient Education  2017 [employer].

## 2020-01-19 ENCOUNTER — Telehealth: Payer: Self-pay

## 2020-01-19 NOTE — Telephone Encounter (Signed)
Pt called wanting to know what was going on with his nasal spray. I called [facility] and spoke to [place]. It was filled on 12/22/2019 and will not be due til 02/20/2020. This is the same amount that he has been getting each time per [place]. I told pt he can call and speak to Remo Lipps if he thinks this is still incorrect. Also, he can pick up otc Flonase until he figures something else out. Pt said he would get it otc

## 2020-01-23 ENCOUNTER — Ambulatory Visit: Payer: Medicare HMO | Attending: Internal Medicine

## 2020-01-23 DIAGNOSIS — Z23 Encounter for immunization: Secondary | ICD-10-CM | POA: Insufficient documentation

## 2020-01-23 NOTE — Progress Notes (Signed)
   Covid-19 Vaccination Clinic  Name:  Erma Frierson    MRN: [id number] DOB: 28-Jun-1946  01/23/2020  Mr. Darley was observed post Covid-19 immunization for 30 minutes based on pre-vaccination screening without incidence. He was provided with Vaccine Information Sheet and instruction to access the V-Safe system.   Mr. Grigas was instructed to call 911 with any severe reactions post vaccine: Marland Kitchen Difficulty breathing  . Swelling of your face and throat  . A fast heartbeat  . A bad rash all over your body  . Dizziness and weakness    Immunizations Administered    Name Date Dose VIS Date Route   Pfizer COVID-19 Vaccine 01/23/2020  1:05 PM 0.3 mL 11/07/2019 Intramuscular   Manufacturer: [place]   Lot: [id number]   [place]: [id number]

## 2020-02-24 ENCOUNTER — Ambulatory Visit: Payer: Medicare HMO | Attending: Internal Medicine

## 2020-02-24 DIAGNOSIS — Z23 Encounter for immunization: Secondary | ICD-10-CM

## 2020-02-24 NOTE — Progress Notes (Signed)
   Covid-19 Vaccination Clinic  Name:  Gary Kramer    MRN: [id number] DOB: May 19, 1946  02/24/2020  [name] was observed post Covid-19 immunization for 15 minutes without incident. He was provided with Vaccine Information Sheet and instruction to access the V-Safe system.   [name] was instructed to call 911 with any severe reactions post vaccine: Marland Kitchen Difficulty breathing  . Swelling of face and throat  . A fast heartbeat  . A bad rash all over body  . Dizziness and weakness   Immunizations Administered    Name Date Dose VIS Date Route   Pfizer COVID-19 Vaccine 02/24/2020 11:58 AM 0.3 mL 11/07/2019 Intramuscular   Manufacturer: [place]   Lot: [phone] [id number]   [place]: [id number]

## 2020-03-08 ENCOUNTER — Other Ambulatory Visit
Admission: RE | Admit: 2020-03-08 | Discharge: 2020-03-08 | Disposition: A | Discharge status: Home / Self Care | Payer: Medicare HMO | Source: Ambulatory Visit | Attending: Gastroenterology | Admitting: Gastroenterology

## 2020-03-08 ENCOUNTER — Other Ambulatory Visit: Payer: Self-pay

## 2020-03-08 DIAGNOSIS — Z01812 Encounter for preprocedural laboratory examination: Secondary | ICD-10-CM | POA: Insufficient documentation

## 2020-03-08 DIAGNOSIS — Z20822 Contact with and (suspected) exposure to covid-19: Secondary | ICD-10-CM | POA: Diagnosis not present

## 2020-03-08 LAB — SARS CORONAVIRUS 2 (TAT 6-24 HRS): SARS Coronavirus 2: NEGATIVE

## 2020-03-09 ENCOUNTER — Encounter: Payer: Self-pay | Admitting: Gastroenterology

## 2020-03-10 ENCOUNTER — Encounter
Admission: RE | Disposition: A | Discharge status: Home / Self Care | Payer: Self-pay | Source: Home / Self Care | Attending: Gastroenterology

## 2020-03-10 ENCOUNTER — Ambulatory Visit: Payer: Medicare HMO | Admitting: Certified Registered Nurse Anesthetist

## 2020-03-10 ENCOUNTER — Encounter: Payer: Self-pay | Admitting: Gastroenterology

## 2020-03-10 ENCOUNTER — Ambulatory Visit
Admission: RE | Admit: 2020-03-10 | Discharge: 2020-03-10 | Disposition: A | Discharge status: Home / Self Care | Payer: Medicare HMO | Attending: Gastroenterology | Admitting: Gastroenterology

## 2020-03-10 ENCOUNTER — Other Ambulatory Visit: Payer: Self-pay

## 2020-03-10 DIAGNOSIS — I129 Hypertensive chronic kidney disease with stage 1 through stage 4 chronic kidney disease, or unspecified chronic kidney disease: Secondary | ICD-10-CM | POA: Diagnosis not present

## 2020-03-10 DIAGNOSIS — Z823 Family history of stroke: Secondary | ICD-10-CM | POA: Insufficient documentation

## 2020-03-10 DIAGNOSIS — I10 Essential (primary) hypertension: Secondary | ICD-10-CM | POA: Diagnosis not present

## 2020-03-10 DIAGNOSIS — D120 Benign neoplasm of cecum: Secondary | ICD-10-CM | POA: Insufficient documentation

## 2020-03-10 DIAGNOSIS — K621 Rectal polyp: Secondary | ICD-10-CM | POA: Diagnosis not present

## 2020-03-10 DIAGNOSIS — K635 Polyp of colon: Secondary | ICD-10-CM

## 2020-03-10 DIAGNOSIS — Z880 Allergy status to penicillin: Secondary | ICD-10-CM | POA: Insufficient documentation

## 2020-03-10 DIAGNOSIS — Z96652 Presence of left artificial knee joint: Secondary | ICD-10-CM | POA: Insufficient documentation

## 2020-03-10 DIAGNOSIS — Z7982 Long term (current) use of aspirin: Secondary | ICD-10-CM | POA: Diagnosis not present

## 2020-03-10 DIAGNOSIS — E785 Hyperlipidemia, unspecified: Secondary | ICD-10-CM | POA: Diagnosis not present

## 2020-03-10 DIAGNOSIS — Z86010 Personal history of colonic polyps: Secondary | ICD-10-CM | POA: Diagnosis not present

## 2020-03-10 DIAGNOSIS — Z888 Allergy status to other drugs, medicaments and biological substances status: Secondary | ICD-10-CM | POA: Diagnosis not present

## 2020-03-10 DIAGNOSIS — M1990 Unspecified osteoarthritis, unspecified site: Secondary | ICD-10-CM | POA: Insufficient documentation

## 2020-03-10 DIAGNOSIS — Z808 Family history of malignant neoplasm of other organs or systems: Secondary | ICD-10-CM | POA: Diagnosis not present

## 2020-03-10 DIAGNOSIS — F329 Major depressive disorder, single episode, unspecified: Secondary | ICD-10-CM | POA: Insufficient documentation

## 2020-03-10 DIAGNOSIS — D122 Benign neoplasm of ascending colon: Secondary | ICD-10-CM | POA: Insufficient documentation

## 2020-03-10 DIAGNOSIS — Z1211 Encounter for screening for malignant neoplasm of colon: Secondary | ICD-10-CM | POA: Insufficient documentation

## 2020-03-10 DIAGNOSIS — Z87891 Personal history of nicotine dependence: Secondary | ICD-10-CM | POA: Insufficient documentation

## 2020-03-10 DIAGNOSIS — N183 Chronic kidney disease, stage 3 unspecified: Secondary | ICD-10-CM | POA: Diagnosis not present

## 2020-03-10 DIAGNOSIS — Z96641 Presence of right artificial hip joint: Secondary | ICD-10-CM | POA: Insufficient documentation

## 2020-03-10 DIAGNOSIS — R69 Illness, unspecified: Secondary | ICD-10-CM | POA: Diagnosis not present

## 2020-03-10 HISTORY — PX: COLONOSCOPY WITH PROPOFOL: SHX5780

## 2020-03-10 LAB — HM COLONOSCOPY

## 2020-03-10 SURGERY — COLONOSCOPY WITH PROPOFOL
Anesthesia: General

## 2020-03-10 MED ORDER — PROPOFOL 500 MG/50ML IV EMUL
INTRAVENOUS | Status: DC | PRN
  Administered 2020-03-10: 150 ug/kg/min via INTRAVENOUS

## 2020-03-10 MED ORDER — PROPOFOL 10 MG/ML IV BOLUS
INTRAVENOUS | Status: DC | PRN
  Administered 2020-03-10: 40 mg via INTRAVENOUS

## 2020-03-10 MED ORDER — LIDOCAINE HCL (CARDIAC) PF 100 MG/5ML IV SOSY
PREFILLED_SYRINGE | INTRAVENOUS | Status: DC | PRN
  Administered 2020-03-10: 50 mg via INTRAVENOUS

## 2020-03-10 MED ORDER — SODIUM CHLORIDE 0.9 % IV SOLN: INTRAVENOUS | Status: DC

## 2020-03-10 MED ORDER — PHENYLEPHRINE HCL (PRESSORS) 10 MG/ML IV SOLN
INTRAVENOUS | Status: DC | PRN
  Administered 2020-03-10 (×2): 100 ug via INTRAVENOUS

## 2020-03-10 MED ORDER — PROPOFOL 500 MG/50ML IV EMUL
INTRAVENOUS | Status: AC
  Filled 2020-03-10: qty 50

## 2020-03-10 NOTE — H&P (Addendum)
Gary Bellows, MD [address] [place], [place], [place], [address] [id number], [place], [place], [place], [address] Phone: [phone]  Fax: [phone]  Primary Care Physician:  Juline Patch, MD   Pre-Procedure History & Physical: HPI:  Gary Kramer is a 74 y.o. male is here for an colonoscopy.   Past Medical History:  Diagnosis Date  . Allergy   . Arthritis   . Depression   . Erectile dysfunction   . Hyperlipidemia   . Hypertension     Past Surgical History:  Procedure Laterality Date  . COLONOSCOPY  2016   cleared for 5 yrs- Dr Candace Cruise  . HERNIA REPAIR    . 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 JOINT REPLACEMENT Right 03/2018   Hip  . 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 Authorizing Provider  amLODipine-benazepril (LOTREL) 10-20 MG capsule TAKE ONE (1) CAPSULE EACH DAY. 01/05/20  Yes Juline Patch, MD  Ascorbic Acid (VITAMIN C) 100 MG tablet Take 1,000 mg by mouth daily.    Yes [provider]  aspirin EC 81 MG tablet Take 81 mg by mouth daily.    Yes [provider]  atorvastatin (LIPITOR) 20 MG tablet Take 1 tablet (20 mg total) by mouth daily. 01/05/20  Yes Juline Patch, MD  carvedilol (COREG) 6.25 MG tablet TAKE (1) TABLET BY MOUTH TWICE DAILY 01/05/20  Yes Juline Patch, MD  cholecalciferol (VITAMIN D) 400 units TABS tablet Take 1,000 Units by mouth daily.    Yes [provider]  fenofibrate (TRICOR) 145 MG tablet TAKE 1 TABLET EVERY MORNING AT 6AM. 01/05/20  Yes Juline Patch, MD  fluticasone (FLONASE) 50 MCG/ACT nasal spray Place 1 spray into both nostrils daily as needed (for sinus/allergy issues.). 01/05/20  Yes Juline Patch, MD  Garlic [id number] MG CAPS Take 1,000 mg by mouth daily.    Yes [provider]  hydrochlorothiazide (MICROZIDE) 12.5 MG capsule TAKE (1) CAPSULE BY MOUTH EVERY DAY AT 6AM 01/05/20  Yes Juline Patch, MD  loratadine (CLARITIN) 10 MG tablet TAKE (1) TABLET BY MOUTH EVERY DAY 01/05/20  Yes Juline Patch, MD  Multiple Vitamin (MULTI-VITAMINS) TABS Take 1 tablet by mouth daily at 6 (six) AM.   Yes [provider]  Omega-3 Fatty Acids (FISH OIL MAXIMUM STRENGTH) 1200 MG CPDR  08/07/19  Yes [provider]  sertraline (ZOLOFT) 50 MG tablet Take 1 tablet (50 mg total) by mouth daily at 6 (six) AM. 01/05/20  Yes Juline Patch, MD  diclofenac (VOLTAREN) 75 MG EC tablet One tablet qday 01/05/20   Juline Patch, MD  diclofenac Sodium (VOLTAREN) 1 % GEL Apply topically. PRN 10/08/15   [provider]    Allergies as of 01/12/2020 - Review Complete 01/12/2020  Allergen Reaction Noted  . Cefaclor Itching 07/20/2015  . Penicillin v potassium Rash 07/20/2015    Family History  Problem Relation Age of Onset  . Cancer Mother        brain  . Stroke Father     Social History   Socioeconomic History  . Marital status: Widowed    Spouse name: Not on file  . Number of children: 2  . Years of education:  Not on file  . Highest education level: Bachelor's degree (e.g., BA, AB, BS)  Occupational History  . Occupation: Retired  Tobacco Use  . Smoking status: Former Smoker    Packs/day: 1.50    Years: 30.00    Pack years: 45.00    Types: Cigarettes    Quit date: 1996    Years since quitting: 25.3  . Smokeless tobacco: Never Used  . Tobacco comment: smoking cessatioinn materials not required  Substance and Sexual Activity  . Alcohol use: Yes    Alcohol/week: 0.0 standard drinks    Comment: only occasionally  . Drug use: No  . Sexual activity: Not Currently  Other Topics Concern  . Not on file  Social History Narrative  . Not on file   Social Determinants of Health   Financial Resource Strain: Low Risk   . Difficulty of Paying  Living Expenses: Not hard at all  Food Insecurity: No Food Insecurity  . Worried About Charity fundraiser in the Last Year: Never true  . Ran Out of Food in the Last Year: Never true  Transportation Needs: No Transportation Needs  . Lack of Transportation (Medical): No  . Lack of Transportation (Non-Medical): No  Physical Activity: Inactive  . Days of Exercise per Week: 0 days  . Minutes of Exercise per Session: 0 min  Stress: No Stress Concern Present  . Feeling of Stress : Only a little  Social Connections: Moderately Isolated  . Frequency of Communication with Friends and Family: More than three times a week  . Frequency of Social Gatherings with Friends and Family: Once a week  . Attends Religious Services: Never  . Active Member of Clubs or Organizations: No  . Attends Archivist Meetings: Never  . Marital Status: Widowed  Intimate Partner Violence: Not At Risk  . Fear of Current or Ex-Partner: No  . Emotionally Abused: No  . Physically Abused: No  . Sexually Abused: No    Review of Systems: See HPI, otherwise negative ROS  Physical Exam: BP (!) 149/84   Pulse 81   Temp (!) 97.2 F (36.2 C) (Temporal)   Resp 20   Ht 6' (1.829 m)   Wt 111.1 kg   SpO2 97%   BMI 33.23 kg/m  General:   Alert,  pleasant and cooperative in NAD Head:  Normocephalic and atraumatic. Neck:  Supple; no masses or thyromegaly. Lungs:  Clear throughout to auscultation, normal respiratory effort.    Heart:  +S1, +S2, Regular rate and rhythm, No edema. Abdomen:  Soft, nontender and nondistended. Normal bowel sounds, without guarding, and without rebound.   Neurologic:  Alert and  oriented x4;  grossly normal neurologically.  Impression/Plan: Gary Kramer is here for an colonoscopy to be performed for Screening colonoscopy , prior history of colon polyps Risks, benefits, limitations, and alternatives regarding  colonoscopy have been reviewed with the patient.  Questions have  been answered.  All parties agreeable.   Gary Bellows, MD  03/10/2020, 12:45 PM

## 2020-03-10 NOTE — Transfer of Care (Signed)
Immediate Anesthesia Transfer of Care Note  Patient: Gary Kramer  Procedure(s) Performed: COLONOSCOPY WITH PROPOFOL (N/A )  Patient Location: PACU  Anesthesia Type:General  Level of Consciousness: awake, alert  and oriented  Airway & Oxygen Therapy: Patient Spontanous Breathing and Patient connected to nasal cannula oxygen  Post-op Assessment: Report given to RN and Post -op Vital signs reviewed and stable  Post vital signs: Reviewed and stable  Last Vitals:  Vitals Value Taken Time  BP    Temp    Pulse 77 03/10/20 1416  Resp 11 03/10/20 1416  SpO2 92 % 03/10/20 1416  Vitals shown include unvalidated device data.  Last Pain:  Vitals:   03/10/20 1221  TempSrc: Temporal  PainSc: 0-No pain         Complications: No apparent anesthesia complications

## 2020-03-10 NOTE — Op Note (Signed)
[facility] Gastroenterology Patient Name: Gary Kramer Procedure Date: 03/10/2020 12:51 PM MRN: [id number] Account #: [id number] Date of Birth: [birth year] Admit Type: Inpatient Age: 74 Room: [facility] ENDO ROOM 1 Gender: Male Note Status: Finalized Procedure:             Colonoscopy Indications:           High risk colon cancer surveillance: Personal history                         of colonic polyps Providers:             Jonathon Bellows MD, MD Referring MD:          Juline Patch, MD (Referring MD) Medicines:             Monitored Anesthesia Care Complications:         No immediate complications. Procedure:             Pre-Anesthesia Assessment:                        - Prior to the procedure, a History and Physical was                         performed, and patient medications, allergies and                         sensitivities were reviewed. The patient's tolerance                         of previous anesthesia was reviewed.                        - The risks and benefits of the procedure and the                         sedation options and risks were discussed with the                         patient. All questions were answered and informed                         consent was obtained.                        - ASA Grade Assessment: II - A patient with mild                         systemic disease.                        After obtaining informed consent, the colonoscope was                         passed under direct vision. Throughout the procedure,                         the patient's blood pressure, pulse, and oxygen                         saturations were monitored continuously. The  Colonoscope was introduced through the anus and                         advanced to the the cecum, identified by the                         appendiceal orifice. The colonoscopy was technically                         difficult and complex due to a tortuous  colon. The                         patient tolerated the procedure well. The quality of                         the bowel preparation was good. Findings:      The perianal and digital rectal examinations were normal.      A 4 mm polyp was found in the rectum. The polyp was sessile. The polyp       was removed with a cold snare. Resection and retrieval were complete.      Two sessile polyps were found in the cecum. The polyps were 8 to 12 mm       in size. These polyps were removed with a piecemeal technique using a       cold snare. Resection and retrieval were complete. To prevent bleeding       after the polypectomy, five hemostatic clips were successfully placed.       There was no bleeding at the end of the procedure.      Three sessile polyps were found in the proximal ascending colon. The       polyps were 10 to 13 mm in size. These polyps were removed with a       piecemeal technique using a cold snare. Resection and retrieval were       complete.      The exam was otherwise without abnormality on direct and retroflexion       views. Impression:            - One 4 mm polyp in the rectum, removed with a cold                         snare. Resected and retrieved.                        - Two 8 to 12 mm polyps in the cecum, removed                         piecemeal using a cold snare. Resected and retrieved.                         Clips were placed.                        - Three 10 to 13 mm polyps in the proximal ascending                         colon, removed piecemeal using a cold snare. Resected  and retrieved.                        - The examination was otherwise normal on direct and                         retroflexion views. Recommendation:        - Discharge patient to home (with escort).                        - Resume previous diet.                        - Continue present medications.                        - Await pathology results.                         - Repeat colonoscopy in 6 months for surveillance                         after piecemeal polypectomy. Procedure Code(s):     --- Professional ---                        [phone], Colonoscopy, flexible; with removal of                         tumor(s), polyp(s), or other lesion(s) by snare                         technique Diagnosis Code(s):     --- Professional ---                        Z86.010, Personal history of colonic polyps                        K62.1, Rectal polyp                        K63.5, Polyp of colon CPT copyright 2019 [employer] Association. All rights reserved. The codes documented in this report are preliminary and upon coder review may  be revised to meet current compliance requirements. Jonathon Bellows, MD Jonathon Bellows MD, MD 03/10/2020 2:16:46 PM This report has been signed electronically. Number of Addenda: 0 Note Initiated On: 03/10/2020 12:51 PM Scope Withdrawal Time: 0 hours 49 minutes 0 seconds  Total Procedure Duration: 0 hours 52 minutes 17 seconds  Estimated Blood Loss:  Estimated blood loss: none.      [facility]

## 2020-03-10 NOTE — Anesthesia Preprocedure Evaluation (Signed)
Anesthesia Evaluation  Patient identified by MRN, date of birth, ID band Patient awake    Reviewed: Allergy & Precautions, NPO status , Patient's Chart, lab work & pertinent test results, reviewed documented beta blocker date and time   Airway Mallampati: III  TM Distance: >3 FB     Dental  (+) Chipped   Pulmonary former smoker,           Cardiovascular hypertension, Pt. on medications and Pt. on home beta blockers      Neuro/Psych PSYCHIATRIC DISORDERS Depression negative neurological ROS     GI/Hepatic Neg liver ROS,   Endo/Other  negative endocrine ROS  Renal/GU Renal InsufficiencyRenal disease     Musculoskeletal  (+) Arthritis ,   Abdominal   Peds  Hematology negative hematology ROS (+)   Anesthesia Other Findings   Reproductive/Obstetrics                             Anesthesia Physical  Anesthesia Plan  ASA: III  Anesthesia Plan: General   Post-op Pain Management:    Induction: Intravenous  PONV Risk Score and Plan:   Airway Management Planned: Nasal Cannula  Additional Equipment:   Intra-op Plan:   Post-operative Plan:   Informed Consent: I have reviewed the patients History and Physical, chart, labs and discussed the procedure including the risks, benefits and alternatives for the proposed anesthesia with the patient or authorized representative who has indicated his/her understanding and acceptance.       Plan Discussed with: CRNA  Anesthesia Plan Comments:         Anesthesia Quick Evaluation

## 2020-03-11 ENCOUNTER — Encounter: Payer: Self-pay | Admitting: *Deleted

## 2020-03-11 NOTE — Anesthesia Postprocedure Evaluation (Signed)
Anesthesia Post Note  Patient: Gary Kramer  Procedure(s) Performed: COLONOSCOPY WITH PROPOFOL (N/A )  Patient location during evaluation: Endoscopy Anesthesia Type: General Level of consciousness: awake and alert and oriented Pain management: pain level controlled Vital Signs Assessment: post-procedure vital signs reviewed and stable Respiratory status: spontaneous breathing Cardiovascular status: blood pressure returned to baseline Anesthetic complications: no     Last Vitals:  Vitals:   03/10/20 1437 03/10/20 1447  BP: 123/81 (!) 131/95  Pulse: 75 77  Resp: 18 16  Temp:    SpO2: 96% 95%    Last Pain:  Vitals:   03/10/20 1447  TempSrc:   PainSc: 0-No pain                 Lisseth Brazeau

## 2020-03-12 LAB — SURGICAL PATHOLOGY

## 2020-03-16 ENCOUNTER — Encounter: Payer: Self-pay | Admitting: Gastroenterology

## 2020-03-17 ENCOUNTER — Other Ambulatory Visit: Payer: Self-pay

## 2020-03-18 ENCOUNTER — Telehealth: Payer: Self-pay

## 2020-03-18 NOTE — Telephone Encounter (Signed)
Pt returned call. I have informed him of results and Dr. Georgeann Oppenheim recommendation. Pt understands and agrees.

## 2020-03-18 NOTE — Telephone Encounter (Signed)
-----   Message from Jonathon Bellows, MD sent at 03/16/2020 11:25 AM EDT ----- Sherald Hess informed that all the polyps except one were precancerous.  Since the larger ones were taken out in pieces suggest repeat colonoscopy in 6 months to check for any recurrence at the polypectomy site.  Hillary Bow, MD    Dr Jonathon Bellows MD,MRCP [facility]) Gastroenterology/Hepatology Pager: [phone]

## 2020-03-18 NOTE — Telephone Encounter (Signed)
Called pt to inform him of biopsy result and Dr. Georgeann Oppenheim recommendation.  Unable to contact, LVM to return call

## 2020-05-12 ENCOUNTER — Other Ambulatory Visit: Payer: Self-pay | Admitting: Family Medicine

## 2020-05-12 DIAGNOSIS — E785 Hyperlipidemia, unspecified: Secondary | ICD-10-CM

## 2020-05-13 DIAGNOSIS — N1831 Chronic kidney disease, stage 3a: Secondary | ICD-10-CM | POA: Diagnosis not present

## 2020-05-13 DIAGNOSIS — I10 Essential (primary) hypertension: Secondary | ICD-10-CM | POA: Diagnosis not present

## 2020-07-05 ENCOUNTER — Encounter: Payer: Self-pay | Admitting: Family Medicine

## 2020-07-05 ENCOUNTER — Other Ambulatory Visit: Payer: Self-pay

## 2020-07-05 ENCOUNTER — Ambulatory Visit (INDEPENDENT_AMBULATORY_CARE_PROVIDER_SITE_OTHER): Payer: Medicare HMO | Admitting: Family Medicine

## 2020-07-05 VITALS — BP 124/72 | HR 88 | Ht 72.0 in | Wt 243.0 lb

## 2020-07-05 DIAGNOSIS — J301 Allergic rhinitis due to pollen: Secondary | ICD-10-CM

## 2020-07-05 DIAGNOSIS — R69 Illness, unspecified: Secondary | ICD-10-CM

## 2020-07-05 DIAGNOSIS — E785 Hyperlipidemia, unspecified: Secondary | ICD-10-CM | POA: Diagnosis not present

## 2020-07-05 DIAGNOSIS — F3341 Major depressive disorder, recurrent, in partial remission: Secondary | ICD-10-CM

## 2020-07-05 DIAGNOSIS — I10 Essential (primary) hypertension: Secondary | ICD-10-CM | POA: Diagnosis not present

## 2020-07-05 DIAGNOSIS — M19041 Primary osteoarthritis, right hand: Secondary | ICD-10-CM

## 2020-07-05 DIAGNOSIS — M19042 Primary osteoarthritis, left hand: Secondary | ICD-10-CM

## 2020-07-05 MED ORDER — SERTRALINE HCL 50 MG PO TABS: 50.0000 mg | ORAL_TABLET | Freq: Every day | ORAL | 1 refills | Status: DC

## 2020-07-05 MED ORDER — ATORVASTATIN CALCIUM 20 MG PO TABS: ORAL_TABLET | ORAL | 1 refills | Status: DC

## 2020-07-05 MED ORDER — HYDROCHLOROTHIAZIDE 12.5 MG PO CAPS: ORAL_CAPSULE | ORAL | 1 refills | Status: DC

## 2020-07-05 MED ORDER — CARVEDILOL 6.25 MG PO TABS: ORAL_TABLET | ORAL | 1 refills | Status: DC

## 2020-07-05 MED ORDER — AMLODIPINE BESY-BENAZEPRIL HCL 10-20 MG PO CAPS: ORAL_CAPSULE | ORAL | 1 refills | Status: DC

## 2020-07-05 MED ORDER — DICLOFENAC SODIUM 75 MG PO TBEC: DELAYED_RELEASE_TABLET | ORAL | 5 refills | Status: DC

## 2020-07-05 MED ORDER — FLUTICASONE PROPIONATE 50 MCG/ACT NA SUSP: 1.0000 | Freq: Every day | NASAL | 5 refills | Status: DC | PRN

## 2020-07-05 MED ORDER — LORATADINE 10 MG PO TABS: ORAL_TABLET | ORAL | 1 refills | Status: DC

## 2020-07-05 MED ORDER — FENOFIBRATE 145 MG PO TABS: ORAL_TABLET | ORAL | 1 refills | Status: DC

## 2020-07-05 NOTE — Progress Notes (Signed)
Date:  07/05/2020   Name:  Gary Kramer   DOB:  1946/06/03   MRN:  [id number]   Chief Complaint: Depression, Allergic Rhinitis , Hypertension, Hyperlipidemia, and Arthritis  Depression        This is a chronic problem.  The current episode started more than 1 year ago.   The onset quality is gradual.   The problem has been gradually improving since onset.  Associated symptoms include no decreased concentration, no fatigue, no helplessness, no hopelessness, does not have insomnia, not irritable, no restlessness, no decreased interest, no appetite change, no body aches, no myalgias, no headaches, no indigestion, not sad and no suicidal ideas.  Past treatments include SSRIs - Selective serotonin reuptake inhibitors.  Previous treatment provided moderate relief.   Pertinent negatives include no hypothyroidism and no anxiety. Hypertension This is a chronic problem. The current episode started more than 1 year ago. The problem has been gradually improving since onset. The problem is controlled. Pertinent negatives include no anxiety, blurred vision, chest pain, headaches, malaise/fatigue, neck pain, orthopnea, palpitations, peripheral edema, PND, shortness of breath or sweats. There are no associated agents to hypertension. Risk factors for coronary artery disease include dyslipidemia. There are no compliance problems.  There is no history of angina, kidney disease, CAD/MI, CVA, heart failure, left ventricular hypertrophy, PVD or retinopathy. There is no history of chronic renal disease, a hypertension causing med or renovascular disease.  Hyperlipidemia This is a chronic problem. The current episode started more than 1 year ago. The problem is controlled. Recent lipid tests were reviewed and are normal. He has no history of chronic renal disease, diabetes, hypothyroidism, liver disease, obesity or nephrotic syndrome. Pertinent negatives include no chest pain, focal sensory loss, focal weakness, leg  pain, myalgias or shortness of breath. Current antihyperlipidemic treatment includes statins. The current treatment provides mild improvement of lipids. There are no compliance problems.   Arthritis Presents for follow-up visit. He reports no pain, stiffness, joint swelling or joint warmth. The symptoms have been stable. Pertinent negatives include no diarrhea, dysuria, fatigue, fever or rash.    Lab Results  Component Value Date   CREATININE 1.63 (H) 01/05/2020   BUN 26 01/05/2020   NA 143 01/05/2020   K 5.0 01/05/2020   CL 106 01/05/2020   CO2 24 01/05/2020   Lab Results  Component Value Date   CHOL 158 07/04/2019   HDL 32 (L) 07/04/2019   LDLCALC 88 07/04/2019   TRIG 188 (H) 07/04/2019   CHOLHDL 4.1 07/16/2018   No results found for: TSH No results found for: HGBA1C Lab Results  Component Value Date   WBC 11.3 (H) 04/04/2018   HGB 9.7 (L) 04/04/2018   HCT 28.5 (L) 04/04/2018   MCV 85.9 04/04/2018   PLT 264 04/04/2018   Lab Results  Component Value Date   ALT 21 07/04/2019   AST 21 07/04/2019   ALKPHOS 98 07/04/2019   BILITOT 0.4 07/04/2019     Review of Systems  Constitutional: Negative for appetite change, chills, fatigue, fever and malaise/fatigue.  HENT: Negative for drooling, ear discharge, ear pain and sore throat.   Eyes: Negative for blurred vision.  Respiratory: Negative for cough, shortness of breath and wheezing.   Cardiovascular: Negative for chest pain, palpitations, orthopnea, leg swelling and PND.  Gastrointestinal: Negative for abdominal pain, blood in stool, constipation, diarrhea and nausea.  Endocrine: Negative for polydipsia.  Genitourinary: Negative for dysuria, frequency, hematuria and urgency.  Musculoskeletal: Positive for  arthritis. Negative for back pain, joint swelling, myalgias, neck pain and stiffness.  Skin: Negative for rash.  Allergic/Immunologic: Negative for environmental allergies.  Neurological: Negative for dizziness, focal  weakness and headaches.  Hematological: Does not bruise/bleed easily.  Psychiatric/Behavioral: Positive for depression. Negative for decreased concentration and suicidal ideas. The patient is not nervous/anxious and does not have insomnia.     Patient Active Problem List   Diagnosis Date Noted  . Stage 3 chronic kidney disease 09/02/2019  . Obesity (BMI 30.0-34.9) 06/06/2018  . Status post total hip replacement, right 04/02/2018  . Presence of left artificial knee joint 01/24/2018  . Recurrent major depressive disorder, in partial remission ([place]) 01/16/2018  . Primary osteoarthritis of left knee 12/26/2017  . Chronic allergic rhinitis 01/05/2017  . Essential hypertension 07/20/2015  . Hyperlipidemia 07/20/2015  . Erectile dysfunction 07/20/2015  . Depression 07/20/2015  . Allergic rhinitis due to pollen 07/20/2015  . Degenerative joint disease of hand 07/20/2015    Allergies  Allergen Reactions  . Cefaclor Itching  . Penicillin V Potassium Rash    Has patient had a PCN reaction causing immediate rash, facial/tongue/throat swelling, SOB or lightheadedness with hypotension: No Has patient had a PCN reaction causing severe rash involving mucus membranes or skin necrosis: Unknown Has patient had a PCN reaction that required hospitalization: No Has patient had a PCN reaction occurring within the last 10 years: No If all of the above answers are "NO", then may proceed with Cephalosporin use.     Past Surgical History:  Procedure Laterality Date  . COLONOSCOPY  2016   cleared for 5 yrs- Dr Candace Cruise  . COLONOSCOPY WITH PROPOFOL N/A 03/10/2020   Procedure: COLONOSCOPY WITH PROPOFOL;  Surgeon: Jonathon Bellows, MD;  Location: [facility] ENDOSCOPY;  Service: Gastroenterology;  Laterality: N/A;  . HERNIA REPAIR    . 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 JOINT REPLACEMENT Right 03/2018   Hip  . 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   Procedure: TOTAL HIP ARTHROPLASTY;  Surgeon: Corky Mull, MD;   Location: [facility] ORS;  Service: Orthopedics;  Laterality: Right;    Social History   Tobacco Use  . Smoking status: Former Smoker    Packs/day: 1.50    Years: 30.00    Pack years: 45.00    Types: Cigarettes    Quit date: 1996    Years since quitting: 25.6  . Smokeless tobacco: Never Used  . Tobacco comment: smoking cessatioinn materials not required  Vaping Use  . Vaping Use: Never used  Substance Use Topics  . Alcohol use: Yes    Alcohol/week: 0.0 standard drinks    Comment: only occasionally  . Drug use: No     Medication list has been reviewed and updated.  Current Meds  Medication Sig  . amLODipine-benazepril (LOTREL) 10-20 MG capsule TAKE ONE (1) CAPSULE EACH DAY.  Marland Kitchen Ascorbic Acid (VITAMIN C) 100 MG tablet Take 1,000 mg by mouth daily.   Marland Kitchen aspirin EC 81 MG tablet Take 81 mg by mouth daily.   Marland Kitchen atorvastatin (LIPITOR) 20 MG tablet TAKE (1) TABLET BY MOUTH EVERY DAY  . carvedilol (COREG) 6.25 MG tablet TAKE (1) TABLET BY MOUTH TWICE DAILY  . cholecalciferol (VITAMIN D) 400 units TABS tablet Take 1,000 Units by mouth daily.   . diclofenac (VOLTAREN) 75 MG EC tablet One tablet qday  . fenofibrate (TRICOR) 145 MG tablet TAKE 1 TABLET EVERY MORNING AT 6AM.  . fluticasone (FLONASE) 50 MCG/ACT nasal spray Place 1  spray into both nostrils daily as needed (for sinus/allergy issues.).  Marland Kitchen Garlic 211 MG CAPS Take 1,000 mg by mouth daily.   . hydrochlorothiazide (MICROZIDE) 12.5 MG capsule TAKE (1) CAPSULE BY MOUTH EVERY DAY AT 6AM  . loratadine (CLARITIN) 10 MG tablet TAKE (1) TABLET BY MOUTH EVERY DAY  . Multiple Vitamin (MULTI-VITAMINS) TABS Take 1 tablet by mouth daily at 6 (six) AM.  . Omega-3 Fatty Acids (FISH OIL MAXIMUM STRENGTH) 1200 MG CPDR   . sertraline (ZOLOFT) 50 MG tablet Take 1 tablet (50 mg total) by mouth daily at 6 (six) AM.    PHQ 2/9 Scores 07/05/2020 01/12/2020 01/05/2020 11/12/2019  PHQ - 2 Score 0 0 0 0  PHQ- 9 Score 0 - 0 0    GAD 7 : Generalized Anxiety  Score 07/05/2020 01/05/2020  Nervous, Anxious, on Edge 0 0  Control/stop worrying 0 0  Worry too much - different things 0 0  Trouble relaxing 0 0  Restless 0 0  Easily annoyed or irritable 0 0  Afraid - awful might happen 0 0  Total GAD 7 Score 0 0    BP Readings from Last 3 Encounters:  07/05/20 124/72  03/10/20 (!) 131/95  01/12/20 120/70    Physical Exam Vitals and nursing note reviewed.  Constitutional:      General: He is not irritable. HENT:     Head: Normocephalic.     Right Ear: Tympanic membrane and external ear normal.     Left Ear: Tympanic membrane, ear canal and external ear normal.     Nose: Nose normal. No congestion or rhinorrhea.     Mouth/Throat:     Mouth: Mucous membranes are moist.  Eyes:     General: No scleral icterus.       Right eye: No discharge.        Left eye: No discharge.     Conjunctiva/sclera: Conjunctivae normal.     Pupils: Pupils are equal, round, and reactive to light.  Neck:     Thyroid: No thyromegaly.     Vascular: No JVD.     Trachea: No tracheal deviation.  Cardiovascular:     Rate and Rhythm: Normal rate and regular rhythm.     Heart sounds: Normal heart sounds. No murmur heard.  No friction rub. No gallop.   Pulmonary:     Effort: No respiratory distress.     Breath sounds: Normal breath sounds. No stridor. No wheezing, rhonchi or rales.  Chest:     Chest wall: No tenderness.  Abdominal:     General: Bowel sounds are normal.     Palpations: Abdomen is soft. There is no mass.     Tenderness: There is no abdominal tenderness. There is no guarding or rebound.  Musculoskeletal:        General: No tenderness or deformity. Normal range of motion.     Cervical back: Normal range of motion and neck supple.  Lymphadenopathy:     Cervical: No cervical adenopathy.  Skin:    General: Skin is warm.     Findings: No rash.  Neurological:     Mental Status: He is alert and oriented to person, place, and time.     Cranial Nerves: No  cranial nerve deficit.     Deep Tendon Reflexes: Reflexes are normal and symmetric.     Wt Readings from Last 3 Encounters:  07/05/20 243 lb (110.2 kg)  03/10/20 245 lb (111.1 kg)  01/12/20 251 lb 6.4  oz (114 kg)    BP 124/72   Pulse 88   Ht 6' (1.829 m)   Wt 243 lb (110.2 kg)   BMI 32.96 kg/m   Assessment and Plan: 1. Essential hypertension Chronic.  Controlled.  Stable.  Continue amlodipine benazepril 10-21 a day and Coreg 6.25 mg 1 twice a day as well as hydrochlorothiazide 12.5 mg once a day.  Will check renal function panel to assess GFR and electrolytes. - amLODipine-benazepril (LOTREL) 10-20 MG capsule; TAKE ONE (1) CAPSULE EACH DAY.  Dispense: 90 capsule; Refill: 1 - carvedilol (COREG) 6.25 MG tablet; TAKE (1) TABLET BY MOUTH TWICE DAILY  Dispense: 180 tablet; Refill: 1 - hydrochlorothiazide (MICROZIDE) 12.5 MG capsule; TAKE (1) CAPSULE BY MOUTH EVERY DAY AT 6AM  Dispense: 90 capsule; Refill: 1 - Renal Function Panel  2. Hyperlipidemia, unspecified hyperlipidemia type Chronic.  Controlled.  Stable.  Continue Lipitor 20 mg once a day and fenofibrate 145 mg 1 a day.  Will check lipid panel for current status of LDL. - atorvastatin (LIPITOR) 20 MG tablet; TAKE (1) TABLET BY MOUTH EVERY DAY  Dispense: 90 tablet; Refill: 1 - fenofibrate (TRICOR) 145 MG tablet; TAKE 1 TABLET EVERY MORNING AT 6AM.  Dispense: 90 tablet; Refill: 1 - Lipid Panel With LDL/HDL Ratio  3. Osteoarthritis of both hands, unspecified osteoarthritis type Chronic.  Controlled.  Stable.  We will check renal function panel and if GFR still is a concern we may need to cut back on the diclofenac 50 mg EC otherwise if it is holding steady progress state 75. - diclofenac (VOLTAREN) 75 MG EC tablet; One tablet qday  Dispense: 30 tablet; Refill: 5  4. Seasonal allergic rhinitis due to pollen Chronic.  Controlled.  Stable.  Continue fluticasone 50 mg with 1 spray in each nostril daily - fluticasone (FLONASE) 50  MCG/ACT nasal spray; Place 1 spray into both nostrils daily as needed (for sinus/allergy issues.).  Dispense: 16 g; Refill: 5 - loratadine (CLARITIN) 10 MG tablet; TAKE (1) TABLET BY MOUTH EVERY DAY  Dispense: 90 tablet; Refill: 1  5. Recurrent major depressive disorder, in partial remission (HCC) Chronic.  Controlled.  Stable.  PHQ is 0 as well as gad score being 0.  We will continue sertraline 50 mg once a day. - sertraline (ZOLOFT) 50 MG tablet; Take 1 tablet (50 mg total) by mouth daily at 6 (six) AM.  Dispense: 90 tablet; Refill: 1  6. Taking one medication for chronic disease Patient is currently taking statin for lipid control and we will check hepatic function panel. - Hepatic function panel

## 2020-07-06 LAB — RENAL FUNCTION PANEL
Albumin: 4.5 g/dL (ref 3.7–4.7)
BUN/Creatinine Ratio: 15 (ref 10–24)
BUN: 21 mg/dL (ref 8–27)
CO2: 26 mmol/L (ref 20–29)
Calcium: 9.8 mg/dL (ref 8.6–10.2)
Chloride: 104 mmol/L (ref 96–106)
Creatinine, Ser: 1.37 mg/dL — ABNORMAL HIGH (ref 0.76–1.27)
GFR calc Af Amer: 59 mL/min/{1.73_m2} — ABNORMAL LOW (ref >59)
GFR calc non Af Amer: 51 mL/min/{1.73_m2} — ABNORMAL LOW (ref >59)
Glucose: 162 mg/dL — ABNORMAL HIGH (ref 65–99)
Phosphorus: 3 mg/dL (ref 2.8–4.1)
Potassium: 4.3 mmol/L (ref 3.5–5.2)
Sodium: 143 mmol/L (ref 134–144)

## 2020-07-06 LAB — HEPATIC FUNCTION PANEL
ALT: 21 IU/L (ref 0–44)
AST: 17 IU/L (ref 0–40)
Alkaline Phosphatase: 91 IU/L (ref 48–121)
Bilirubin Total: 0.3 mg/dL (ref 0.0–1.2)
Bilirubin, Direct: 0.13 mg/dL (ref 0.00–0.40)
Total Protein: 6.4 g/dL (ref 6.0–8.5)

## 2020-07-06 LAB — LIPID PANEL WITH LDL/HDL RATIO
Cholesterol, Total: 172 mg/dL (ref 100–199)
HDL: 32 mg/dL — ABNORMAL LOW (ref >39)
LDL Chol Calc (NIH): 96 mg/dL (ref 0–99)
LDL/HDL Ratio: 3 ratio (ref 0.0–3.6)
Triglycerides: 260 mg/dL — ABNORMAL HIGH (ref 0–149)
VLDL Cholesterol Cal: 44 mg/dL — ABNORMAL HIGH (ref 5–40)

## 2020-07-14 DIAGNOSIS — R69 Illness, unspecified: Secondary | ICD-10-CM | POA: Diagnosis not present

## 2020-08-03 ENCOUNTER — Other Ambulatory Visit: Payer: Self-pay

## 2020-08-03 ENCOUNTER — Ambulatory Visit (INDEPENDENT_AMBULATORY_CARE_PROVIDER_SITE_OTHER): Payer: Medicare HMO

## 2020-08-03 DIAGNOSIS — Z23 Encounter for immunization: Secondary | ICD-10-CM | POA: Diagnosis not present

## 2020-09-13 DIAGNOSIS — N1831 Chronic kidney disease, stage 3a: Secondary | ICD-10-CM | POA: Diagnosis not present

## 2020-09-13 DIAGNOSIS — I10 Essential (primary) hypertension: Secondary | ICD-10-CM | POA: Diagnosis not present

## 2020-11-08 ENCOUNTER — Other Ambulatory Visit: Payer: Self-pay | Admitting: Family Medicine

## 2020-11-08 DIAGNOSIS — J301 Allergic rhinitis due to pollen: Secondary | ICD-10-CM

## 2020-11-08 DIAGNOSIS — M19041 Primary osteoarthritis, right hand: Secondary | ICD-10-CM

## 2020-11-08 DIAGNOSIS — I10 Essential (primary) hypertension: Secondary | ICD-10-CM

## 2020-11-08 DIAGNOSIS — E785 Hyperlipidemia, unspecified: Secondary | ICD-10-CM

## 2020-11-08 DIAGNOSIS — M19042 Primary osteoarthritis, left hand: Secondary | ICD-10-CM

## 2020-11-08 NOTE — Telephone Encounter (Signed)
Requested medication (s) are due for refill today:  Yes  Requested medication (s) are on the active medication list:  Yes  Future visit scheduled:  Yes  Last Refill: 07/05/20; #30; RF x 5  Notes to clinic:  Diclofenac failed protocol due to abnormal labs; last hgb very low in 2019  Requested Prescriptions  Pending Prescriptions Disp Refills   diclofenac (VOLTAREN) 75 MG EC tablet [Pharmacy Med Name: DICLOFENAC SODIUM 75 MG DR TAB] 30 tablet 5    Sig: TAKE ONE (1) TABLET BY MOUTH ONCE DAILY      Analgesics:  NSAIDS Failed - 11/08/2020  2:57 PM      Failed - Cr in normal range and within 360 days    Creatinine, Ser  Date Value Ref Range Status  07/05/2020 1.37 (H) 0.76 - 1.27 mg/dL Final          Failed - HGB in normal range and within 360 days    Hemoglobin  Date Value Ref Range Status  04/04/2018 9.7 (L) 13.0 - 18.0 g/dL Final  12/27/2017 13.6 13.0 - 17.7 g/dL Final          Passed - Patient is not pregnant      Passed - Valid encounter within last 12 months    Recent Outpatient Visits           4 months ago Essential hypertension   [facility] Juline Patch, MD   10 months ago Essential hypertension   [facility] Juline Patch, MD   12 months ago Essential hypertension   [facility] Juline Patch, MD   1 year ago Essential hypertension   [facility] Juline Patch, MD   1 year ago Essential hypertension   [facility] Juline Patch, MD       Future Appointments             In 1 month Juline Patch, MD [facility]              Signed Prescriptions Disp Refills   amLODipine-benazepril (LOTREL) 10-20 MG capsule 90 capsule 0    Sig: TAKE (1) CAPSULE BY MOUTH EVERY DAY      Cardiovascular: CCB + ACEI Combos Failed - 11/08/2020  2:57 PM      Failed - Cr in normal range and within 180 days    Creatinine, Ser  Date Value Ref Range Status  07/05/2020 1.37 (H) 0.76 - 1.27 mg/dL Final           Passed - K in normal range and within 180 days    Potassium  Date Value Ref Range Status  07/05/2020 4.3 3.5 - 5.2 mmol/L Final          Passed - Patient is not pregnant      Passed - Last BP in normal range    BP Readings from Last 1 Encounters:  07/05/20 124/72          Passed - Valid encounter within last 6 months    Recent Outpatient Visits           4 months ago Essential hypertension   [facility] Juline Patch, MD   10 months ago Essential hypertension   [facility] Juline Patch, MD   12 months ago Essential hypertension   [facility], Deanna C, MD   1 year ago Essential hypertension   [place],  Iven Finn, MD   1 year ago Essential hypertension   [facility] Juline Patch, MD       Future Appointments             In 1 month Juline Patch, MD [facility]               atorvastatin (LIPITOR) 20 MG tablet 90 tablet 0    Sig: TAKE (1) TABLET BY MOUTH EVERY DAY      Cardiovascular:  Antilipid - Statins Failed - 11/08/2020  2:57 PM      Failed - LDL in normal range and within 360 days    LDL Chol Calc (NIH)  Date Value Ref Range Status  07/05/2020 96 0 - 99 mg/dL Final          Failed - HDL in normal range and within 360 days    HDL  Date Value Ref Range Status  07/05/2020 32 (L) >39 mg/dL Final          Failed - Triglycerides in normal range and within 360 days    Triglycerides  Date Value Ref Range Status  07/05/2020 260 (H) 0 - 149 mg/dL Final          Passed - Total Cholesterol in normal range and within 360 days    Cholesterol, Total  Date Value Ref Range Status  07/05/2020 172 100 - 199 mg/dL Final          Passed - Patient is not pregnant      Passed - Valid encounter within last 12 months    Recent Outpatient Visits           4 months ago Essential hypertension   [place], Deanna C, MD   10 months ago  Essential hypertension   [place], Deanna C, MD   12 months ago Essential hypertension   [facility] Juline Patch, MD   1 year ago Essential hypertension   [place], Deanna C, MD   1 year ago Essential hypertension   [place], Deanna C, MD       Future Appointments             In 1 month Juline Patch, MD [facility]               loratadine (ALLERGY RELIEF) 10 MG tablet 90 tablet 0    Sig: TAKE (1) TABLET BY MOUTH EVERY DAY      Ear, Nose, and Throat:  Antihistamines Passed - 11/08/2020  2:57 PM      Passed - Valid encounter within last 12 months    Recent Outpatient Visits           4 months ago Essential hypertension   [place], Deanna C, MD   10 months ago Essential hypertension   [place], Deanna C, MD   12 months ago Essential hypertension   [place], Deanna C, MD   1 year ago Essential hypertension   [place], Deanna C, MD   1 year ago Essential hypertension   [place], Deanna C, MD       Future Appointments             In 1 month Juline Patch, MD [facility], [facility]

## 2020-11-08 NOTE — Telephone Encounter (Signed)
Requested Prescriptions  Pending Prescriptions Disp Refills  . amLODipine-benazepril (LOTREL) 10-20 MG capsule [Pharmacy Med Name: AMLODIPINE-BENAZEPRIL 10-20 MG CAP] 90 capsule 0    Sig: TAKE (1) CAPSULE BY MOUTH EVERY DAY     Cardiovascular: CCB + ACEI Combos Failed - 11/08/2020  2:57 PM      Failed - Cr in normal range and within 180 days    Creatinine, Ser  Date Value Ref Range Status  07/05/2020 1.37 (H) 0.76 - 1.27 mg/dL Final         Passed - K in normal range and within 180 days    Potassium  Date Value Ref Range Status  07/05/2020 4.3 3.5 - 5.2 mmol/L Final         Passed - Patient is not pregnant      Passed - Last BP in normal range    BP Readings from Last 1 Encounters:  07/05/20 124/72         Passed - Valid encounter within last 6 months    Recent Outpatient Visits          4 months ago Essential hypertension   [place], Deanna C, MD   10 months ago Essential hypertension   [place], Deanna C, MD   12 months ago Essential hypertension   [place], Deanna C, MD   1 year ago Essential hypertension   [place], Deanna C, MD   1 year ago Essential hypertension   [place], Deanna C, MD      Future Appointments            In 1 month Juline Patch, MD [facility]           . atorvastatin (LIPITOR) 20 MG tablet [Pharmacy Med Name: ATORVASTATIN CALCIUM 20 MG TAB] 90 tablet 0    Sig: TAKE (1) TABLET BY MOUTH EVERY DAY     Cardiovascular:  Antilipid - Statins Failed - 11/08/2020  2:57 PM      Failed - LDL in normal range and within 360 days    LDL Chol Calc (NIH)  Date Value Ref Range Status  07/05/2020 96 0 - 99 mg/dL Final         Failed - HDL in normal range and within 360 days    HDL  Date Value Ref Range Status  07/05/2020 32 (L) >39 mg/dL Final         Failed - Triglycerides in normal range and within 360 days    Triglycerides  Date  Value Ref Range Status  07/05/2020 260 (H) 0 - 149 mg/dL Final         Passed - Total Cholesterol in normal range and within 360 days    Cholesterol, Total  Date Value Ref Range Status  07/05/2020 172 100 - 199 mg/dL Final         Passed - Patient is not pregnant      Passed - Valid encounter within last 12 months    Recent Outpatient Visits          4 months ago Essential hypertension   [place], Deanna C, MD   10 months ago Essential hypertension   [place], Deanna C, MD   12 months ago Essential hypertension   [facility] Juline Patch, MD   1 year ago Essential hypertension   [facility] Juline Patch,  MD   1 year ago Essential hypertension   [facility] Juline Patch, MD      Future Appointments            In 1 month Juline Patch, MD [facility]           . loratadine (ALLERGY RELIEF) 10 MG tablet [Pharmacy Med Name: ALLERGY RELIEF 10 MG TAB] 90 tablet 0    Sig: TAKE (1) TABLET BY MOUTH EVERY DAY     Ear, Nose, and Throat:  Antihistamines Passed - 11/08/2020  2:57 PM      Passed - Valid encounter within last 12 months    Recent Outpatient Visits          4 months ago Essential hypertension   [place], Deanna C, MD   10 months ago Essential hypertension   [place], Deanna C, MD   12 months ago Essential hypertension   [place], Deanna C, MD   1 year ago Essential hypertension   [place], Deanna C, MD   1 year ago Essential hypertension   [facility] Jul[facility] Patch, MD      Future Appointments            In 1 month Juline Patch, MD [facility]           . diclofenac (VOLTAREN) 75 MG EC tablet [Pharmacy Med Name: DICLOFENAC SODIUM 75 MG DR TAB] 30 tablet 5    Sig: TAKE ONE (1) TABLET BY MOUTH ONCE DAILY     Analgesics:  NSAIDS Failed - 11/08/2020  2:57 PM       Failed - Cr in normal range and within 360 days    Creatinine, Ser  Date Value Ref Range Status  07/05/2020 1.37 (H) 0.76 - 1.27 mg/dL Final         Failed - HGB in normal range and within 360 days    Hemoglobin  Date Value Ref Range Status  04/04/2018 9.7 (L) 13.0 - 18.0 g/dL Final  12/27/2017 13.6 13.0 - 17.7 g/dL Final         Passed - Patient is not pregnant      Passed - Valid encounter within last 12 months    Recent Outpatient Visits          4 months ago Essential hypertension   [place], Deanna C, MD   10 months ago Essential hypertension   [place], Deanna C, MD   12 months ago Essential hypertension   [place], Deanna C, MD   1 year ago Essential hypertension   [place], Deanna C, MD   1 year ago Essential hypertension   [place], Deanna C, MD      Future Appointments            In 1 month Juline Patch, MD [facility], [facility]

## 2020-12-15 ENCOUNTER — Ambulatory Visit: Payer: Medicare HMO | Admitting: Family Medicine

## 2021-01-06 ENCOUNTER — Other Ambulatory Visit: Payer: Self-pay

## 2021-01-06 ENCOUNTER — Ambulatory Visit (INDEPENDENT_AMBULATORY_CARE_PROVIDER_SITE_OTHER): Payer: Medicare HMO | Admitting: Family Medicine

## 2021-01-06 ENCOUNTER — Encounter: Payer: Self-pay | Admitting: Family Medicine

## 2021-01-06 VITALS — BP 120/78 | HR 64 | Ht 72.0 in | Wt 247.0 lb

## 2021-01-06 DIAGNOSIS — F3341 Major depressive disorder, recurrent, in partial remission: Secondary | ICD-10-CM | POA: Diagnosis not present

## 2021-01-06 DIAGNOSIS — J301 Allergic rhinitis due to pollen: Secondary | ICD-10-CM

## 2021-01-06 DIAGNOSIS — E785 Hyperlipidemia, unspecified: Secondary | ICD-10-CM

## 2021-01-06 DIAGNOSIS — R69 Illness, unspecified: Secondary | ICD-10-CM | POA: Diagnosis not present

## 2021-01-06 DIAGNOSIS — I10 Essential (primary) hypertension: Secondary | ICD-10-CM | POA: Diagnosis not present

## 2021-01-06 MED ORDER — HYDROCHLOROTHIAZIDE 12.5 MG PO CAPS: ORAL_CAPSULE | ORAL | 1 refills | Status: DC

## 2021-01-06 MED ORDER — FLUTICASONE PROPIONATE 50 MCG/ACT NA SUSP: 1.0000 | Freq: Every day | NASAL | 5 refills | Status: DC | PRN

## 2021-01-06 MED ORDER — ATORVASTATIN CALCIUM 20 MG PO TABS: ORAL_TABLET | ORAL | 1 refills | Status: DC

## 2021-01-06 MED ORDER — FENOFIBRATE 145 MG PO TABS: ORAL_TABLET | ORAL | 1 refills | Status: DC

## 2021-01-06 MED ORDER — LORATADINE 10 MG PO TABS
ORAL_TABLET | ORAL | 1 refills | Status: DC
Start: 2021-01-06 — End: 2021-06-30

## 2021-01-06 MED ORDER — CARVEDILOL 6.25 MG PO TABS: ORAL_TABLET | ORAL | 1 refills | Status: DC

## 2021-01-06 MED ORDER — AMLODIPINE BESY-BENAZEPRIL HCL 10-20 MG PO CAPS
ORAL_CAPSULE | ORAL | 0 refills | Status: DC
Start: 2021-01-06 — End: 2021-05-09

## 2021-01-06 MED ORDER — SERTRALINE HCL 50 MG PO TABS
50.0000 mg | ORAL_TABLET | Freq: Every day | ORAL | 1 refills | Status: DC
Start: 2021-01-06 — End: 2021-06-30

## 2021-01-06 NOTE — Progress Notes (Signed)
n   Date:  01/06/2021   Name:  Gary Kramer   DOB:  1946/03/24   MRN:  [id number]   Chief Complaint: Hypertension, Hyperlipidemia, Osteoarthritis, Depression, and Allergic Rhinitis   Hypertension This is a chronic problem. The current episode started more than 1 year ago. The problem has been gradually improving since onset. The problem is controlled. Pertinent negatives include no anxiety, blurred vision, chest pain, headaches, malaise/fatigue, neck pain, orthopnea, palpitations, peripheral edema, PND, shortness of breath or sweats. There are no associated agents to hypertension. Past treatments include ACE inhibitors, calcium channel blockers, diuretics and beta blockers. The current treatment provides moderate improvement. There are no compliance problems.  There is no history of angina, kidney disease, CAD/MI, CVA, heart failure, left ventricular hypertrophy, PVD or retinopathy. There is no history of chronic renal disease, a hypertension causing med or renovascular disease.  Hyperlipidemia This is a chronic problem. The current episode started more than 1 year ago. The problem is controlled. Recent lipid tests were reviewed and are normal. He has no history of chronic renal disease, diabetes, hypothyroidism, liver disease, obesity or nephrotic syndrome. Factors aggravating his hyperlipidemia include beta blockers. Pertinent negatives include no chest pain, focal sensory loss, focal weakness, leg pain, myalgias or shortness of breath. Current antihyperlipidemic treatment includes statins. The current treatment provides moderate improvement of lipids. There are no compliance problems.  Risk factors for coronary artery disease include post-menopausal, hypertension and dyslipidemia.  Depression        This is a chronic problem.  The current episode started more than 1 year ago.   The onset quality is gradual.   The problem occurs intermittently.  The problem has been waxing and waning since onset.   Associated symptoms include no decreased concentration, no fatigue, no helplessness, no hopelessness, does not have insomnia, not irritable, no restlessness, no decreased interest, no appetite change, no body aches, no myalgias, no headaches, no indigestion, not sad and no suicidal ideas.  Past treatments include SSRIs - Selective serotonin reuptake inhibitors.  Compliance with treatment is variable.  Previous treatment provided moderate relief.   Pertinent negatives include no hypothyroidism and no anxiety. URI  This is a chronic (allergic rhinitis) problem. The current episode started more than 1 year ago. The problem has been gradually improving. There has been no fever. Pertinent negatives include no abdominal pain, chest pain, coughing, diarrhea, dysuria, ear pain, headaches, nausea, neck pain, rash, sore throat or wheezing.    Lab Results  Component Value Date   CREATININE 1.37 (H) 07/05/2020   BUN 21 07/05/2020   NA 143 07/05/2020   K 4.3 07/05/2020   CL 104 07/05/2020   CO2 26 07/05/2020   Lab Results  Component Value Date   CHOL 172 07/05/2020   HDL 32 (L) 07/05/2020   LDLCALC 96 07/05/2020   TRIG 260 (H) 07/05/2020   CHOLHDL 4.1 07/16/2018   No results found for: TSH No results found for: HGBA1C Lab Results  Component Value Date   WBC 11.3 (H) 04/04/2018   HGB 9.7 (L) 04/04/2018   HCT 28.5 (L) 04/04/2018   MCV 85.9 04/04/2018   PLT 264 04/04/2018   Lab Results  Component Value Date   ALT 21 07/05/2020   AST 17 07/05/2020   ALKPHOS 91 07/05/2020   BILITOT 0.3 07/05/2020     Review of Systems  Constitutional: Negative for appetite change, chills, fatigue, fever and malaise/fatigue.  HENT: Negative for drooling, ear discharge, ear pain and sore  throat.   Eyes: Negative for blurred vision.  Respiratory: Negative for cough, shortness of breath and wheezing.   Cardiovascular: Negative for chest pain, palpitations, orthopnea, leg swelling and PND.  Gastrointestinal:  Negative for abdominal pain, blood in stool, constipation, diarrhea and nausea.  Endocrine: Negative for polydipsia.  Genitourinary: Negative for dysuria, frequency, hematuria and urgency.  Musculoskeletal: Negative for back pain, myalgias and neck pain.  Skin: Negative for rash.  Allergic/Immunologic: Negative for environmental allergies.  Neurological: Negative for dizziness, focal weakness and headaches.  Hematological: Does not bruise/bleed easily.  Psychiatric/Behavioral: Positive for depression. Negative for decreased concentration and suicidal ideas. The patient is not nervous/anxious and does not have insomnia.     Patient Active Problem List   Diagnosis Date Noted  . Stage 3 chronic kidney disease ([place]) 09/02/2019  . Obesity (BMI 30.0-34.9) 06/06/2018  . Status post total hip replacement, right 04/02/2018  . Presence of left artificial knee joint 01/24/2018  . Recurrent major depressive disorder, in partial remission ([place]) 01/16/2018  . Primary osteoarthritis of left knee 12/26/2017  . Chronic allergic rhinitis 01/05/2017  . Essential hypertension 07/20/2015  . Hyperlipidemia 07/20/2015  . Erectile dysfunction 07/20/2015  . Depression 07/20/2015  . Allergic rhinitis due to pollen 07/20/2015  . Degenerative joint disease of hand 07/20/2015    Allergies  Allergen Reactions  . Cefaclor Itching  . Penicillin V Potassium Rash    Has patient had a PCN reaction causing immediate rash, facial/tongue/throat swelling, SOB or lightheadedness with hypotension: No Has patient had a PCN reaction causing severe rash involving mucus membranes or skin necrosis: Unknown Has patient had a PCN reaction that required hospitalization: No Has patient had a PCN reaction occurring within the last 10 years: No If all of the above answers are "NO", then may proceed with Cephalosporin use.     Past Surgical History:  Procedure Laterality Date  . COLONOSCOPY  2016   cleared for 5 yrs- Dr Candace Cruise   . COLONOSCOPY WITH PROPOFOL N/A 03/10/2020   Procedure: COLONOSCOPY WITH PROPOFOL;  Surgeon: Jonathon Bellows, MD;  Location: [facility] ENDOSCOPY;  Service: Gastroenterology;  Laterality: N/A;  . HERNIA REPAIR    . 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 JOINT REPLACEMENT Right 03/2018   Hip  . 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   Procedure: TOTAL HIP ARTHROPLASTY;  Surgeon: Corky Mull, MD;  Location: [facility] ORS;  Service: Orthopedics;  Laterality: Right;    Social History   Tobacco Use  . Smoking status: Former Smoker    Packs/day: 1.50    Years: 30.00    Pack years: 45.00    Types: Cigarettes    Quit date: 1996    Years since quitting: 26.1  . Smokeless tobacco: Never Used  . Tobacco comment: smoking cessatioinn materials not required  Vaping Use  . Vaping Use: Never used  Substance Use Topics  . Alcohol use: Yes    Alcohol/week: 0.0 standard drinks    Comment: only occasionally  . Drug use: No     Medication list has been reviewed and updated.  Current Meds  Medication Sig  . amLODipine-benazepril (LOTREL) 10-20 MG capsule TAKE (1) CAPSULE BY MOUTH EVERY DAY  . Ascorbic Acid (VITAMIN C) 100 MG tablet Take 1,000 mg by mouth daily.   Marland Kitchen aspirin EC 81 MG tablet Take 81 mg by mouth daily.   Marland Kitchen atorvastatin (LIPITOR) 20 MG tablet TAKE (1) TABLET BY MOUTH EVERY DAY  . carvedilol (COREG) 6.25 MG tablet  TAKE (1) TABLET BY MOUTH TWICE DAILY  . cholecalciferol (VITAMIN D) 400 units TABS tablet Take 1,000 Units by mouth daily.   . diclofenac Sodium (VOLTAREN) 1 % GEL Apply topically. PRN  . fenofibrate (TRICOR) 145 MG tablet TAKE 1 TABLET EVERY MORNING AT 6AM.  . fluticasone (FLONASE) 50 MCG/ACT nasal spray Place 1 spray into both nostrils daily as needed (for sinus/allergy issues.).  Marland Kitchen Garlic 315 MG CAPS Take 1,000 mg by mouth daily.   . hydrochlorothiazide (MICROZIDE) 12.5 MG capsule TAKE (1) CAPSULE BY MOUTH EVERY DAY AT 6AM  . loratadine (ALLERGY RELIEF) 10 MG tablet TAKE (1)  TABLET BY MOUTH EVERY DAY  . Multiple Vitamin (MULTI-VITAMINS) TABS Take 1 tablet by mouth daily at 6 (six) AM.  . Omega-3 Fatty Acids (FISH OIL MAXIMUM STRENGTH) 1200 MG CPDR   . sertraline (ZOLOFT) 50 MG tablet Take 1 tablet (50 mg total) by mouth daily at 6 (six) AM.    PHQ 2/9 Scores 01/06/2021 07/05/2020 01/12/2020 01/05/2020  PHQ - 2 Score 0 0 0 0  PHQ- 9 Score 0 0 - 0    GAD 7 : Generalized Anxiety Score 01/06/2021 07/05/2020 01/05/2020  Nervous, Anxious, on Edge 0 0 0  Control/stop worrying 0 0 0  Worry too much - different things 0 0 0  Trouble relaxing 0 0 0  Restless 0 0 0  Easily annoyed or irritable 0 0 0  Afraid - awful might happen 0 0 0  Total GAD 7 Score 0 0 0    BP Readings from Last 3 Encounters:  01/06/21 120/78  07/05/20 124/72  03/10/20 (!) 131/95    Physical Exam Vitals and nursing note reviewed.  Constitutional:      General: He is not irritable. HENT:     Head: Normocephalic.     Right Ear: Tympanic membrane, ear canal and external ear normal. There is no impacted cerumen.     Left Ear: Tympanic membrane, ear canal and external ear normal. There is no impacted cerumen.     Nose: Nose normal. No congestion or rhinorrhea.     Mouth/Throat:     Mouth: Oropharynx is clear and moist. Mucous membranes are moist.  Eyes:     General: No scleral icterus.       Right eye: No discharge.        Left eye: No discharge.     Extraocular Movements: EOM normal.     Conjunctiva/sclera: Conjunctivae normal.     Pupils: Pupils are equal, round, and reactive to light.  Neck:     Thyroid: No thyromegaly.     Vascular: No JVD.     Trachea: No tracheal deviation.  Cardiovascular:     Rate and Rhythm: Normal rate and regular rhythm.     Pulses: Intact distal pulses.     Heart sounds: Normal heart sounds. No murmur heard. No friction rub. No gallop.   Pulmonary:     Effort: No respiratory distress.     Breath sounds: Normal breath sounds. No wheezing, rhonchi or rales.   Abdominal:     General: Bowel sounds are normal.     Palpations: Abdomen is soft. There is no hepatosplenomegaly or mass.     Tenderness: There is no abdominal tenderness. There is no CVA tenderness, guarding or rebound.  Musculoskeletal:        General: No tenderness or edema. Normal range of motion.     Cervical back: Normal range of motion and neck supple.  Lymphadenopathy:     Cervical: No cervical adenopathy.  Skin:    General: Skin is warm.     Findings: No rash.  Neurological:     Mental Status: He is alert and oriented to person, place, and time.     Cranial Nerves: No cranial nerve deficit.     Deep Tendon Reflexes: Strength normal and reflexes are normal and symmetric.     Wt Readings from Last 3 Encounters:  01/06/21 247 lb (112 kg)  07/05/20 243 lb (110.2 kg)  03/10/20 245 lb (111.1 kg)    BP 120/78   Pulse 64   Ht 6' (1.829 m)   Wt 247 lb (112 kg)   BMI 33.50 kg/m   Assessment and Plan: 1. Essential hypertension Chronic.  Controlled.  Stable.  Blood pressure 120/78 continue to combination Lotrel 1 a day as well as continuance of hydrochlorothiazide 12.5 mg and carvedilol 6.25 mg twice a day.  We will check renal function panel for electrolytes and GFR. - amLODipine-benazepril (LOTREL) 10-20 MG capsule; TAKE (1) CAPSULE BY MOUTH EVERY DAY  Dispense: 90 capsule; Refill: 0 - carvedilol (COREG) 6.25 MG tablet; TAKE (1) TABLET BY MOUTH TWICE DAILY  Dispense: 180 tablet; Refill: 1 - hydrochlorothiazide (MICROZIDE) 12.5 MG capsule; TAKE (1) CAPSULE BY MOUTH EVERY DAY AT 6AM  Dispense: 90 capsule; Refill: 1 - Renal Function Panel  2. Hyperlipidemia, unspecified hyperlipidemia type Chronic.  Controlled.  Stable.  Continue fenofibrate 145 mg once a day and and atorvastatin 20 mg once a day.  Will check lipid panel for current status of LDL - atorvastatin (LIPITOR) 20 MG tablet; One a day  Dispense: 90 tablet; Refill: 1 - fenofibrate (TRICOR) 145 MG tablet; TAKE 1  TABLET EVERY MORNING AT 6AM.  Dispense: 90 tablet; Refill: 1 - Lipid Panel With LDL/HDL Ratio  3. Recurrent major depressive disorder, in partial remission (HCC) Chronic.  Controlled.  Stable.  PHQ 0 gad score 0 continue sertraline 50 mg once a day. - sertraline (ZOLOFT) 50 MG tablet; Take 1 tablet (50 mg total) by mouth daily at 6 (six) AM.  Dispense: 90 tablet; Refill: 1  4. Seasonal allergic rhinitis due to pollen .  Controlled.  Stable.  Continue Flonase and loratadine with the upcoming pollen season. - fluticasone (FLONASE) 50 MCG/ACT nasal spray; Place 1 spray into both nostrils daily as needed (for sinus/allergy issues.).  Dispense: 16 g; Refill: 5 - loratadine (ALLERGY RELIEF) 10 MG tablet; TAKE (1) TABLET BY MOUTH EVERY DAY  Dispense: 90 tablet; Refill: 1

## 2021-01-07 LAB — LIPID PANEL WITH LDL/HDL RATIO
Cholesterol, Total: 146 mg/dL (ref 100–199)
HDL: 34 mg/dL — ABNORMAL LOW (ref >39)
LDL Chol Calc (NIH): 88 mg/dL (ref 0–99)
LDL/HDL Ratio: 2.6 ratio (ref 0.0–3.6)
Triglycerides: 137 mg/dL (ref 0–149)
VLDL Cholesterol Cal: 24 mg/dL (ref 5–40)

## 2021-01-07 LAB — RENAL FUNCTION PANEL
Albumin: 4.1 g/dL (ref 3.7–4.7)
BUN/Creatinine Ratio: 10 (ref 10–24)
BUN: 13 mg/dL (ref 8–27)
CO2: 24 mmol/L (ref 20–29)
Calcium: 9.7 mg/dL (ref 8.6–10.2)
Chloride: 105 mmol/L (ref 96–106)
Creatinine, Ser: 1.35 mg/dL — ABNORMAL HIGH (ref 0.76–1.27)
GFR calc Af Amer: 60 mL/min/{1.73_m2} (ref >59)
GFR calc non Af Amer: 52 mL/min/{1.73_m2} — ABNORMAL LOW (ref >59)
Glucose: 141 mg/dL — ABNORMAL HIGH (ref 65–99)
Phosphorus: 3.5 mg/dL (ref 2.8–4.1)
Potassium: 4.4 mmol/L (ref 3.5–5.2)
Sodium: 146 mmol/L — ABNORMAL HIGH (ref 134–144)

## 2021-01-12 ENCOUNTER — Ambulatory Visit (INDEPENDENT_AMBULATORY_CARE_PROVIDER_SITE_OTHER): Payer: Medicare HMO

## 2021-01-12 ENCOUNTER — Other Ambulatory Visit: Payer: Self-pay

## 2021-01-12 VITALS — BP 110/66 | HR 66 | Temp 97.8°F | Resp 16 | Ht 72.0 in | Wt 245.6 lb

## 2021-01-12 DIAGNOSIS — Z0000 Encounter for general adult medical examination without abnormal findings: Secondary | ICD-10-CM

## 2021-01-12 NOTE — Progress Notes (Signed)
Subjective:   Gary Kramer is a 75 y.o. male who presents for Medicare Annual/Subsequent preventive examination.  Review of Systems     Cardiac Risk Factors include: advanced age (>11men, >18 women);obesity (BMI >30kg/m2);dyslipidemia;male gender;hypertension     Objective:    Today's Vitals   01/12/21 1444  BP: 110/66  Pulse: 66  Resp: 16  Temp: 97.8 F (36.6 C)  TempSrc: Oral  SpO2: 93%  Weight: 245 lb 9.6 oz (111.4 kg)  Height: 6' (1.829 m)   Body mass index is 33.31 kg/m.  Advanced Directives 01/12/2021 01/12/2020 01/08/2019 04/02/2018 03/20/2018 01/07/2018  Does Patient Have a Medical Advance Directive? Yes Yes Yes Yes Yes No  Type of Paramedic of [place];Living will [place];Living will [place];Living will [place];Living will [place];Living will -  Does patient want to make changes to medical advance directive? - - - No - Patient declined - -  Copy of [place] in Chart? Yes - validated most recent copy scanned in chart (See row information) Yes - validated most recent copy scanned in chart (See row information) Yes - validated most recent copy scanned in chart (See row information) Yes No - copy requested -  Would patient like information on creating a medical advance directive? - - - - - Yes (MAU/Ambulatory/Procedural Areas - Information given)    Current Medications (verified) Outpatient Encounter Medications as of 01/12/2021  Medication Sig  . amLODipine-benazepril (LOTREL) 10-20 MG capsule TAKE (1) CAPSULE BY MOUTH EVERY DAY  . Ascorbic Acid (VITAMIN C) 100 MG tablet Take 1,000 mg by mouth daily.   Marland Kitchen aspirin EC 81 MG tablet Take 81 mg by mouth daily.   Marland Kitchen atorvastatin (LIPITOR) 20 MG tablet One a day  . carvedilol (COREG) 6.25 MG tablet TAKE (1) TABLET BY MOUTH TWICE DAILY  . cholecalciferol (VITAMIN D) 400 units TABS tablet Take 1,000  Units by mouth daily.   . diclofenac Sodium (VOLTAREN) 1 % GEL Apply topically. PRN  . fenofibrate (TRICOR) 145 MG tablet TAKE 1 TABLET EVERY MORNING AT 6AM.  . fluticasone (FLONASE) 50 MCG/ACT nasal spray Place 1 spray into both nostrils daily as needed (for sinus/allergy issues.).  Marland Kitchen Garlic 621 MG CAPS Take 1,000 mg by mouth daily.   . hydrochlorothiazide (MICROZIDE) 12.5 MG capsule TAKE (1) CAPSULE BY MOUTH EVERY DAY AT 6AM  . loratadine (ALLERGY RELIEF) 10 MG tablet TAKE (1) TABLET BY MOUTH EVERY DAY  . Multiple Vitamin (MULTI-VITAMINS) TABS Take 1 tablet by mouth daily at 6 (six) AM.  . Omega-3 Fatty Acids (FISH OIL MAXIMUM STRENGTH) 1200 MG CPDR   . sertraline (ZOLOFT) 50 MG tablet Take 1 tablet (50 mg total) by mouth daily at 6 (six) AM.   No facility-administered encounter medications on file as of 01/12/2021.    Allergies (verified) Cefaclor and Penicillin v potassium   History: Past Medical History:  Diagnosis Date  . Allergy   . Arthritis   . Depression   . Erectile dysfunction   . Hyperlipidemia   . Hypertension    Past Surgical History:  Procedure Laterality Date  . COLONOSCOPY  2016   cleared for 5 yrs- Dr Candace Cruise  . COLONOSCOPY WITH PROPOFOL N/A 03/10/2020   Procedure: COLONOSCOPY WITH PROPOFOL;  Surgeon: Jonathon Bellows, MD;  Location: [facility] ENDOSCOPY;  Service: Gastroenterology;  Laterality: N/A;  . HERNIA REPAIR    . 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  JOINT REPLACEMENT Right 03/2018   Hip  . 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 Cancer Mother        brain  . Stroke Father    Social History   Socioeconomic History  . Marital status: Widowed    Spouse name: Not on file  . Number of children: 2  . Years of education: Not on file  . Highest education level: Bachelor's degree  (e.g., BA, AB, BS)  Occupational History  . Occupation: Retired  Tobacco Use  . Smoking status: Former Smoker    Packs/day: 1.50    Years: 30.00    Pack years: 45.00    Types: Cigarettes    Quit date: 1996    Years since quitting: 26.1  . Smokeless tobacco: Never Used  . Tobacco comment: smoking cessatioinn materials not required  Vaping Use  . Vaping Use: Never used  Substance and Sexual Activity  . Alcohol use: Yes    Alcohol/week: 0.0 standard drinks    Comment: only occasionally  . Drug use: No  . Sexual activity: Not Currently  Other Topics Concern  . Not on file  Social History Narrative  . Not on file   Social Determinants of Health   Financial Resource Strain: Low Risk   . Difficulty of Paying Living Expenses: Not hard at all  Food Insecurity: No Food Insecurity  . Worried About Charity fundraiser in the Last Year: Never true  . Ran Out of Food in the Last Year: Never true  Transportation Needs: No Transportation Needs  . Lack of Transportation (Medical): No  . Lack of Transportation (Non-Medical): No  Physical Activity: Inactive  . Days of Exercise per Week: 0 days  . Minutes of Exercise per Session: 0 min  Stress: No Stress Concern Present  . Feeling of Stress : Not at all  Social Connections: Socially Isolated  . Frequency of Communication with Friends and Family: More than three times a week  . Frequency of Social Gatherings with Friends and Family: Once a week  . Attends Religious Services: Never  . Active Member of Clubs or Organizations: No  . Attends Archivist Meetings: Never  . Marital Status: Widowed    Tobacco Counseling Counseling given: Not Answered Comment: smoking cessatioinn materials not required   Clinical Intake:  Pre-visit preparation completed: Yes  Pain : No/denies pain     BMI - recorded: 33.31 Nutritional Risks: Unintentional weight gain Diabetes: No  How often do you need to have someone help you when you  read instructions, pamphlets, or other written materials from your doctor or pharmacy?: 1 - Never    Interpreter Needed?: No  Information entered by :: Clemetine Marker LPN   Activities of Daily Living In your present state of health, do you have any difficulty performing the following activities: 01/12/2021  Hearing? N  Comment declines hearing aids  Vision? N  Difficulty concentrating or making decisions? N  Walking or climbing stairs? N  Dressing or bathing? N  Doing errands, shopping? N  Preparing Food and eating ? N  Using the Toilet? N  In the past six months, have you accidently leaked urine? N  Do you have problems with loss of bowel control? N  Managing your Medications? N  Managing your Finances? N  Housekeeping or managing your Housekeeping? N  Some recent data might be hidden    Patient Care Team: Juline Patch, MD as PCP - General (Family Medicine) Anthonette Legato, MD (Nephrology) Jonathon Bellows, MD as Consulting Physician (Gastroenterology)  Indicate any recent Medical Services you may have received from other than [facility] providers in the past year (date may be approximate).     Assessment:   This is a routine wellness examination for [place].  Hearing/Vision screen  Hearing Screening   125Hz  250Hz  500Hz  1000Hz  2000Hz  3000Hz  4000Hz  6000Hz  8000Hz   Right ear:           Left ear:           Comments: Pt denies hearing difficulty   Vision Screening Comments: Past due for eye exam; not established with provider.  Dietary issues and exercise activities discussed: Current Exercise Habits: The patient does not participate in regular exercise at present, Exercise limited by: orthopedic condition(s)  Goals    . DIET - INCREASE WATER INTAKE     Recommend to drink at least 6-8 8oz glasses of water per day.    . Increase physical activity     Pt would like to increase physical activity over the next year as tolerated.      Depression Screen PHQ 2/9 Scores 01/12/2021  01/06/2021 07/05/2020 01/12/2020 01/05/2020 11/12/2019 07/04/2019  PHQ - 2 Score 0 0 0 0 0 0 0  PHQ- 9 Score - 0 0 - 0 0 0    Fall Risk Fall Risk  01/12/2021 01/06/2021 07/05/2020 01/12/2020 01/05/2020  Falls in the past year? 1 0 0 1 0  Number falls in past yr: 1 0 - 1 -  Comment - - - tripped on flower pot in the dark -  Injury with Fall? 0 0 - 0 -  Risk for fall due to : History of fall(s) - - No Fall Risks -  Risk for fall due to: Comment - - - - -  Follow up Falls prevention discussed Falls evaluation completed Falls evaluation completed Falls prevention discussed Falls evaluation completed    [place]:  Any stairs in or around the home? Yes  If so, are there any without handrails? No  Home free of loose throw rugs in walkways, pet beds, electrical cords, etc? Yes  Adequate lighting in your home to reduce risk of falls? Yes   ASSISTIVE DEVICES UTILIZED TO PREVENT FALLS:  Life alert? No  Use of a cane, walker or w/c? No  Grab bars in the bathroom? Yes  Shower chair or bench in shower? No  Elevated toilet seat or a handicapped toilet? No   TIMED UP AND GO:  Was the test performed? Yes .  Length of time to ambulate 10 feet: 5 sec.   Gait steady and fast without use of assistive device  Cognitive Function: Normal cognitive status assessed by direct observation by this Nurse Health Advisor. No abnormalities found.       6CIT Screen 01/12/2020 01/08/2019 01/07/2018 01/05/2017  What Year? 0 points 0 points 0 points 0 points  What month? 0 points 0 points 0 points 0 points  What time? 0 points 0 points 0 points 0 points  Count back from 20 0 points 0 points 0 points 0 points  Months in reverse 0 points 0 points 0 points 0 points  Repeat phrase 0 points 0 points 0 points 0 points  Total Score 0 0 0 0  Immunizations Immunization History  Administered Date(s) Administered  . Fluad Quad(high Dose 65+) 08/05/2019, 08/03/2020  . Influenza, High Dose  Seasonal PF 08/17/2017, 08/05/2018  . Influenza,inj,Quad PF,6+ Mos 09/13/2015, 08/28/2016  . PFIZER(Purple Top)SARS-COV-2 Vaccination 01/23/2020, 02/24/2020  . Pneumococcal Conjugate-13 09/18/2014  . Pneumococcal Polysaccharide-23 12/28/2012, 01/07/2018  . Tdap 01/05/2017  . Zoster Recombinat (Shingrix) 02/08/2017, 06/13/2017    TDAP status: Up to date  Flu Vaccine status: Up to date  Pneumococcal vaccine status: Up to date  Covid-19 vaccine status: Completed vaccines  Qualifies for Shingles Vaccine? Yes   Zostavax completed No   Shingrix Completed?: Yes  Screening Tests Health Maintenance  Topic Date Due  . COVID-19 Vaccine (3 - Booster for Pfizer series) 01/24/2021 (Originally 08/26/2020)  . Hepatitis C Screening  07/05/2021 (Originally 1946/03/29)  . COLONOSCOPY (Pts 45-69yrs Insurance coverage will need to be confirmed)  03/10/2021  . TETANUS/TDAP  01/05/2027  . INFLUENZA VACCINE  Completed  . PNA vac Low Risk Adult  Completed    Health Maintenance  There are no preventive care reminders to display for this patient.   Colorectal cancer screening: Type of screening: Colonoscopy. Completed 03/10/20. Repeat every 1 years. Pt advised to contact [facility] to schedule follow up colonoscopy  Lung Cancer Screening: (Low Dose CT Chest recommended if Age 16-80 years, 30 pack-year currently smoking OR have quit w/in 15years.) does not qualify.   Additional Screening:  Hepatitis C Screening: does qualify; postponed  Vision Screening: Recommended annual ophthalmology exams for early detection of glaucoma and other disorders of the eye. Is the patient up to date with their annual eye exam?  No  Who is the provider or what is the name of the office in which the patient attends annual eye exams? Not established  If pt is not established with a provider, would they like to be referred to a provider to establish care? No .   Dental Screening: Recommended annual dental exams for  proper oral hygiene  Community Resource Referral / Chronic Care Management: CRR required this visit?  No   CCM required this visit?  No      Plan:     I have personally reviewed and noted the following in the patient's chart:   . Medical and social history . Use of alcohol, tobacco or illicit drugs  . Current medications and supplements . Functional ability and status . Nutritional status . Physical activity . Advanced directives . List of other physicians . Hospitalizations, surgeries, and ER visits in previous 12 months . Vitals . Screenings to include cognitive, depression, and falls . Referrals and appointments  In addition, I have reviewed and discussed with patient certain preventive protocols, quality metrics, and best practice recommendations. A written personalized care plan for preventive services as well as general preventive health recommendations were provided to patient.      Clemetine Marker, LPN   [id number]/[id number]   Nurse Notes: none

## 2021-01-12 NOTE — Patient Instructions (Signed)
Gary Kramer , Thank you for taking time to come for your Medicare Wellness Visit. I appreciate your ongoing commitment to your health goals. Please review the following plan we discussed and let me know if I can assist you in the future.   Screening recommendations/referrals: Colonoscopy: done 03/10/20. Please contact [facility] to schedule your follow up colonoscopy at [phone].  Recommended yearly ophthalmology/optometry visit for glaucoma screening and checkup Recommended yearly dental visit for hygiene and checkup  Vaccinations: Influenza vaccine: done 08/03/20 Pneumococcal vaccine: done 01/07/18 Tdap vaccine: done 01/05/17 Shingles vaccine: done 02/08/17 & 06/13/17   Covid-19: done 01/23/20 & 02/24/20; please bring your vaccine record with you to your next appointment  Conditions/risks identified: Recommend increasing physical activity   Next appointment: Follow up in one year for your annual wellness visit.   Preventive Care 40 Years and Older, Male Preventive care refers to lifestyle choices and visits with your health care provider that can promote health and wellness. What does preventive care include?  A yearly physical exam. This is also called an annual well check.  Dental exams once or twice a year.  Routine eye exams. Ask your health care provider how often you should have your eyes checked.  Personal lifestyle choices, including:  Daily care of your teeth and gums.  Regular physical activity.  Eating a healthy diet.  Avoiding tobacco and drug use.  Limiting alcohol use.  Practicing safe sex.  Taking low doses of aspirin every day.  Taking vitamin and mineral supplements as recommended by your health care provider. What happens during an annual well check? The services and screenings done by your health care provider during your annual well check will depend on your age, overall health, lifestyle risk factors, and family history of  disease. Counseling  Your health care provider may ask you questions about your:  Alcohol use.  Tobacco use.  Drug use.  Emotional well-being.  Home and relationship well-being.  Sexual activity.  Eating habits.  History of falls.  Memory and ability to understand (cognition).  Work and work Statistician. Screening  You may have the following tests or measurements:  Height, weight, and BMI.  Blood pressure.  Lipid and cholesterol levels. These may be checked every 5 years, or more frequently if you are over 38 years old.  Skin check.  Lung cancer screening. You may have this screening every year starting at age 29 if you have a 30-pack-year history of smoking and currently smoke or have quit within the past 15 years.  Fecal occult blood test (FOBT) of the stool. You may have this test every year starting at age 57.  Flexible sigmoidoscopy or colonoscopy. You may have a sigmoidoscopy every 5 years or a colonoscopy every 10 years starting at age 19.  Prostate cancer screening. Recommendations will vary depending on your family history and other risks.  Hepatitis C blood test.  Hepatitis B blood test.  Sexually transmitted disease (STD) testing.  Diabetes screening. This is done by checking your blood sugar (glucose) after you have not eaten for a while (fasting). You may have this done every 1-3 years.  Abdominal aortic aneurysm (AAA) screening. You may need this if you are a current or former smoker.  Osteoporosis. You may be screened starting at age 50 if you are at high risk. Talk with your health care provider about your test results, treatment options, and if necessary, the need for more tests. Vaccines  Your health care provider may recommend certain vaccines,  such as:  Influenza vaccine. This is recommended every year.  Tetanus, diphtheria, and acellular pertussis (Tdap, Td) vaccine. You may need a Td booster every 10 years.  Zoster vaccine. You may  need this after age 68.  Pneumococcal 13-valent conjugate (PCV13) vaccine. One dose is recommended after age 25.  Pneumococcal polysaccharide (PPSV23) vaccine. One dose is recommended after age 52. Talk to your health care provider about which screenings and vaccines you need and how often you need them. This information is not intended to replace advice given to you by your health care provider. Make sure you discuss any questions you have with your health care provider. Document Released: 12/10/2015 Document Revised: 08/02/2016 Document Reviewed: 09/14/2015 Elsevier Interactive Patient Education  2017 [place] Prevention in the Home Falls can cause injuries. They can happen to people of all ages. There are many things you can do to make your home safe and to help prevent falls. What can I do on the outside of my home?  Regularly fix the edges of walkways and driveways and fix any cracks.  Remove anything that might make you trip as you walk through a door, such as a raised step or threshold.  Trim any bushes or trees on the path to your home.  Use bright outdoor lighting.  Clear any walking paths of anything that might make someone trip, such as rocks or tools.  Regularly check to see if handrails are loose or broken. Make sure that both sides of any steps have handrails.  Any raised decks and porches should have guardrails on the edges.  Have any leaves, snow, or ice cleared regularly.  Use sand or salt on walking paths during winter.  Clean up any spills in your garage right away. This includes oil or grease spills. What can I do in the bathroom?  Use night lights.  Install grab bars by the toilet and in the tub and shower. Do not use towel bars as grab bars.  Use non-skid mats or decals in the tub or shower.  If you need to sit down in the shower, use a plastic, non-slip stool.  Keep the floor dry. Clean up any water that spills on the floor as soon as it  happens.  Remove soap buildup in the tub or shower regularly.  Attach bath mats securely with double-sided non-slip rug tape.  Do not have throw rugs and other things on the floor that can make you trip. What can I do in the bedroom?  Use night lights.  Make sure that you have a light by your bed that is easy to reach.  Do not use any sheets or blankets that are too big for your bed. They should not hang down onto the floor.  Have a firm chair that has side arms. You can use this for support while you get dressed.  Do not have throw rugs and other things on the floor that can make you trip. What can I do in the kitchen?  Clean up any spills right away.  Avoid walking on wet floors.  Keep items that you use a lot in easy-to-reach places.  If you need to reach something above you, use a strong step stool that has a grab bar.  Keep electrical cords out of the way.  Do not use floor polish or wax that makes floors slippery. If you must use wax, use non-skid floor wax.  Do not have throw rugs and other things on  the floor that can make you trip. What can I do with my stairs?  Do not leave any items on the stairs.  Make sure that there are handrails on both sides of the stairs and use them. Fix handrails that are broken or loose. Make sure that handrails are as long as the stairways.  Check any carpeting to make sure that it is firmly attached to the stairs. Fix any carpet that is loose or worn.  Avoid having throw rugs at the top or bottom of the stairs. If you do have throw rugs, attach them to the floor with carpet tape.  Make sure that you have a light switch at the top of the stairs and the bottom of the stairs. If you do not have them, ask someone to add them for you. What else can I do to help prevent falls?  Wear shoes that:  Do not have high heels.  Have rubber bottoms.  Are comfortable and fit you well.  Are closed at the toe. Do not wear sandals.  If you  use a stepladder:  Make sure that it is fully opened. Do not climb a closed stepladder.  Make sure that both sides of the stepladder are locked into place.  Ask someone to hold it for you, if possible.  Clearly mark and make sure that you can see:  Any grab bars or handrails.  First and last steps.  Where the edge of each step is.  Use tools that help you move around (mobility aids) if they are needed. These include:  Canes.  Walkers.  Scooters.  Crutches.  Turn on the lights when you go into a dark area. Replace any light bulbs as soon as they burn out.  Set up your furniture so you have a clear path. Avoid moving your furniture around.  If any of your floors are uneven, fix them.  If there are any pets around you, be aware of where they are.  Review your medicines with your doctor. Some medicines can make you feel dizzy. This can increase your chance of falling. Ask your doctor what other things that you can do to help prevent falls. This information is not intended to replace advice given to you by your health care provider. Make sure you discuss any questions you have with your health care provider. Document Released: 09/09/2009 Document Revised: 04/20/2016 Document Reviewed: 12/18/2014 Elsevier Interactive Patient Education  2017 [employer].

## 2021-02-12 IMAGING — US US RENAL
1 series · 14 of 25 positions shown · non-contrast
Comparison: None.

CLINICAL DATA: CKD stage 3

EXAM:
RENAL / URINARY TRACT ULTRASOUND COMPLETE

[Series 1: us renal · 0.30mm/px · 14 of 28 slices shown]
[im 1/28]
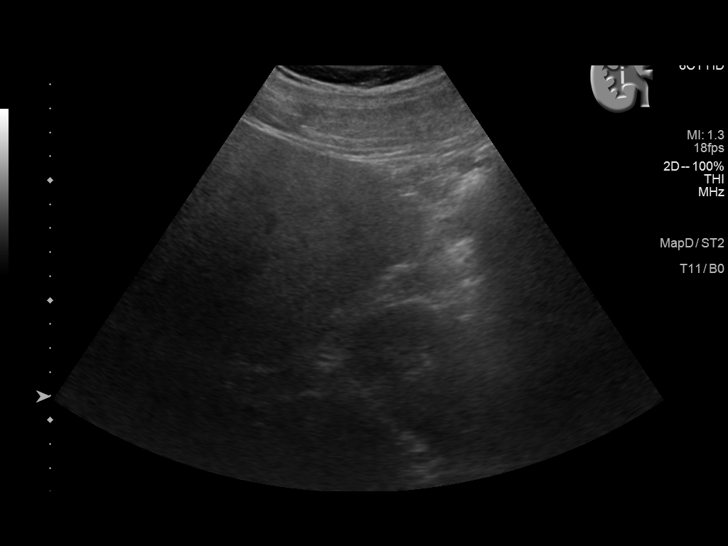
[im 3/28]
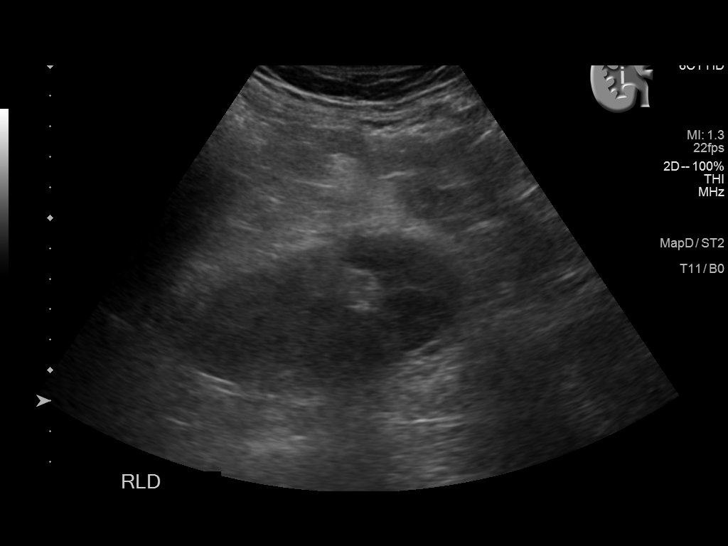
[im 5/28]
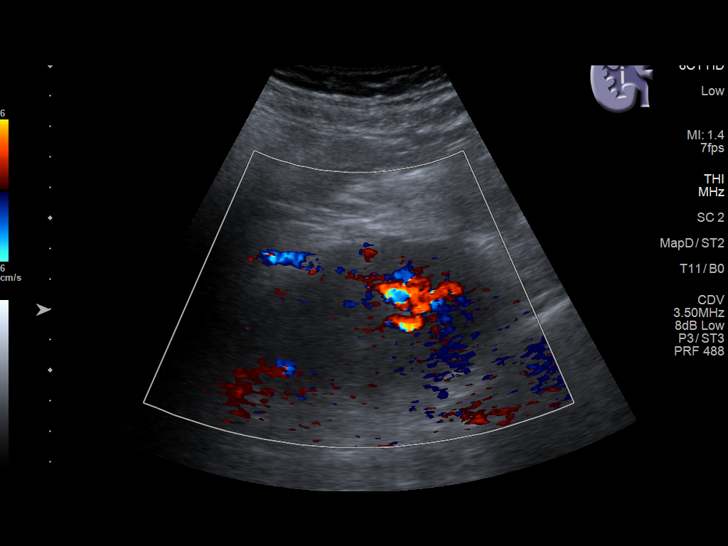
[im 7/28]
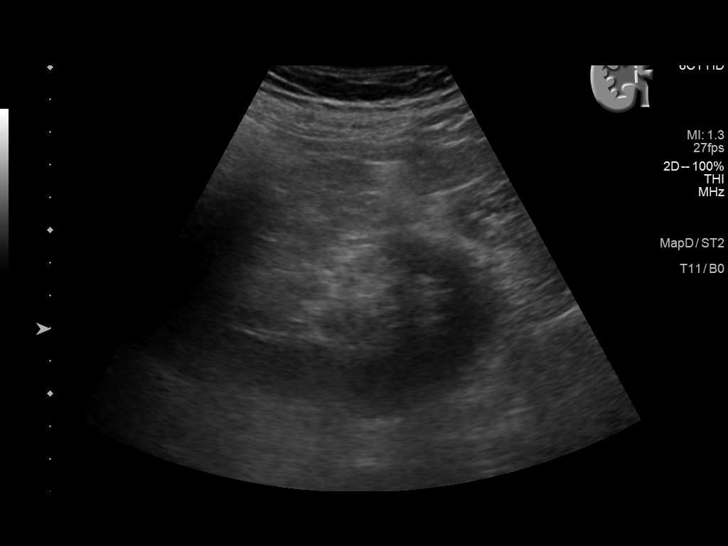
[im 10/28]
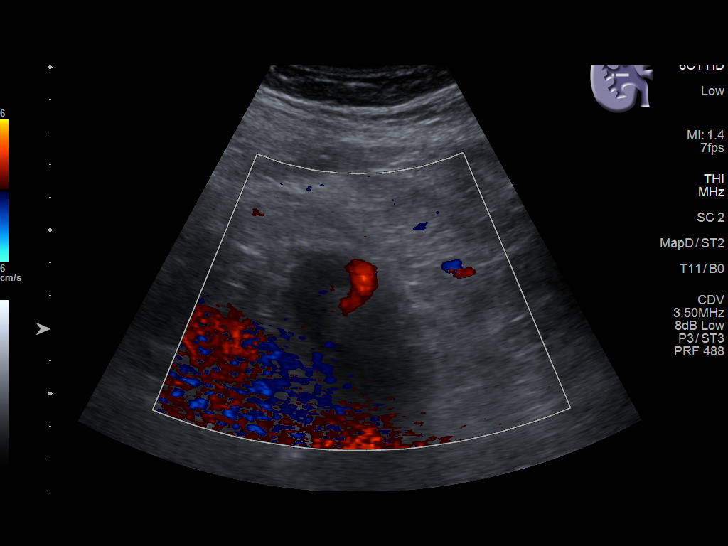
[im 11/28]
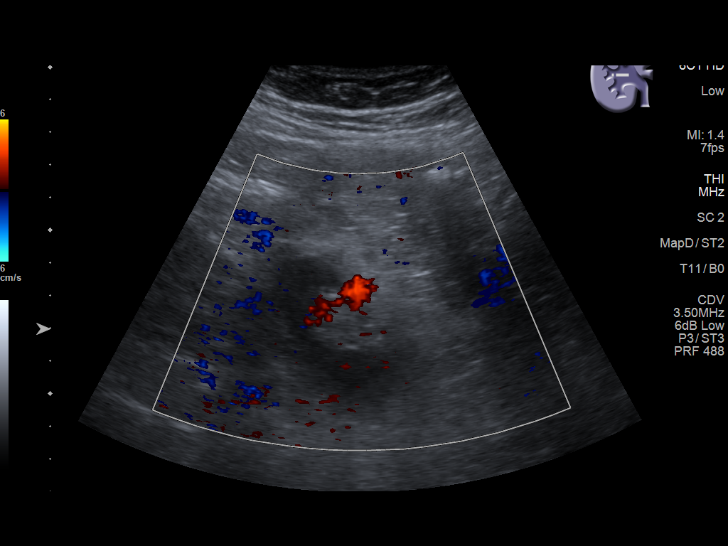
[im 13/28]
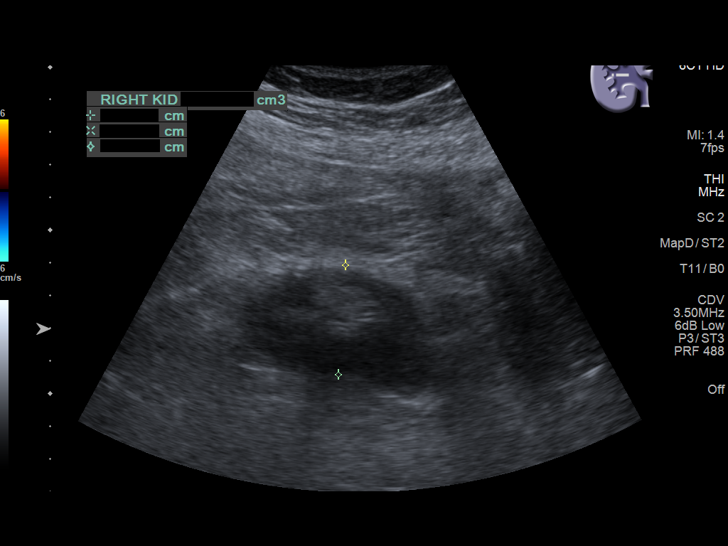
[im 15/28]
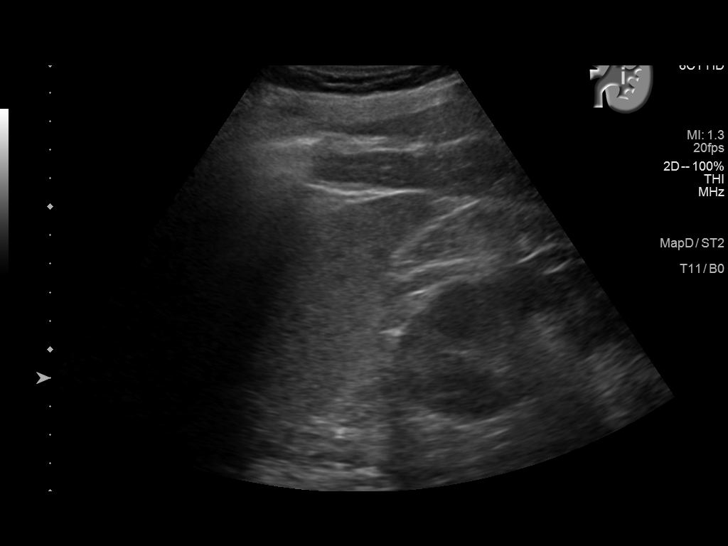
[im 17/28]
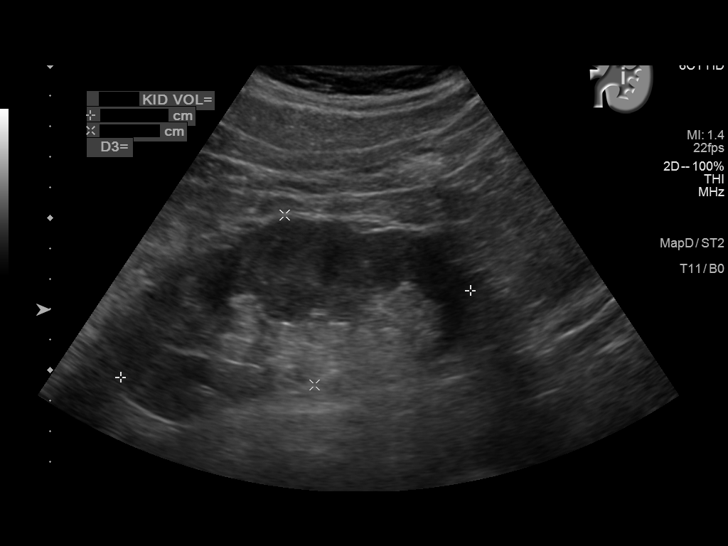
[im 19/28]
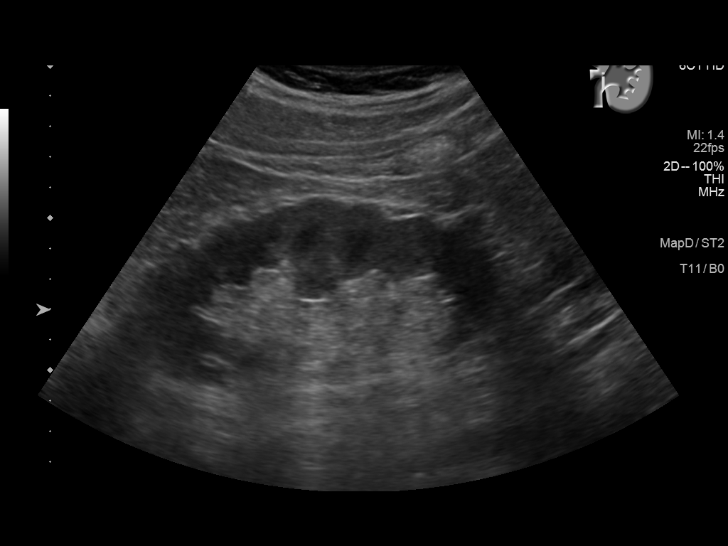
[im 21/28]
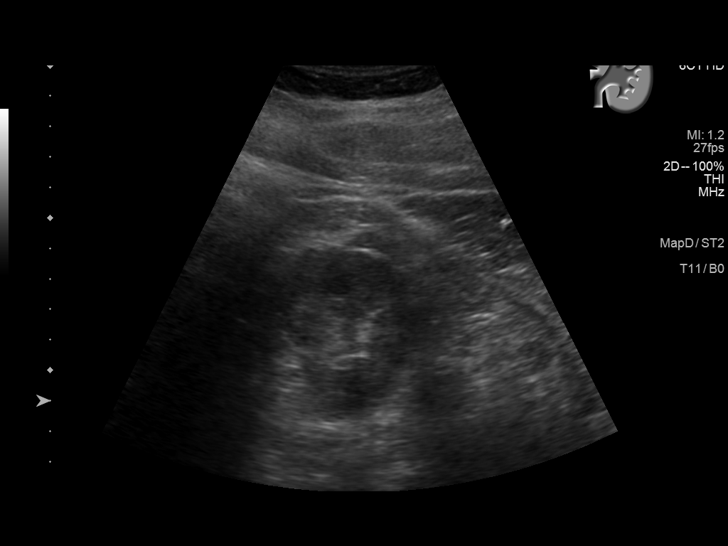
[im 23/28]
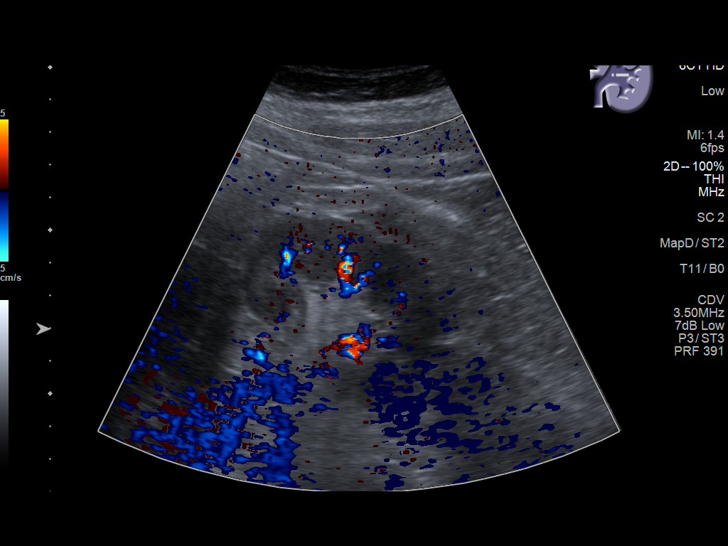
[im 25/28]
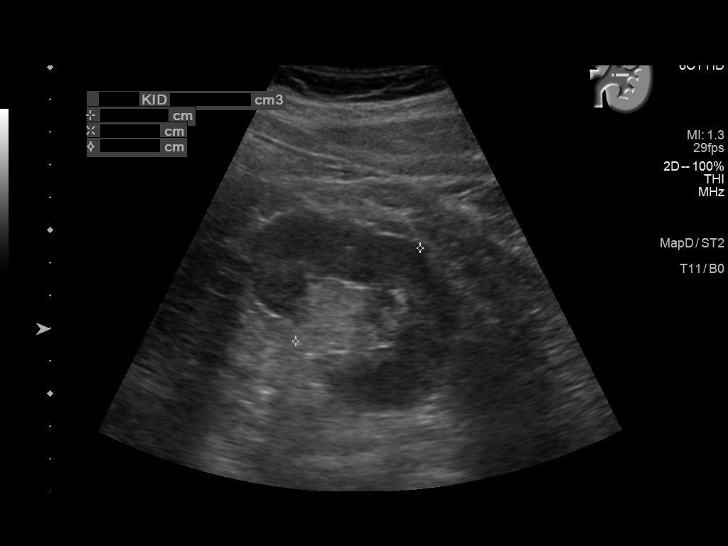
[im 28/28]
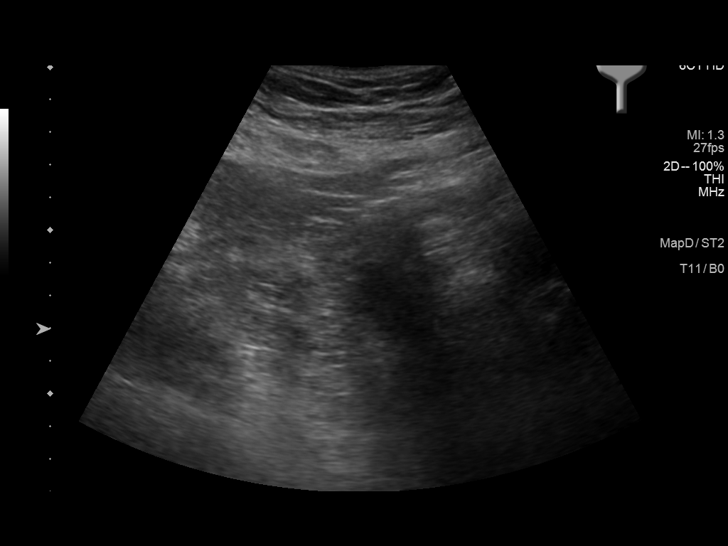

[14 of 25 positions shown; findings below may reference images not displayed]

FINDINGS: Right Kidney:

Renal measurements: 9.6 x 4.6 x 3.4 cm = volume: 77 mL. The right
kidney is located in the pelvis. The kidney is somewhat malrotated.

Left Kidney:

Renal measurements: 11.8 x 5.7 x 4.8 cm = volume: 166 mL.
Echogenicity within normal limits. No mass or hydronephrosis
visualized.

Bladder:

The urinary bladder was not well evaluated secondary to
underdistention.
IMPRESSION: 1. No acute sonographic abnormality detected.
2. The right native kidney is located in the patient's pelvis.

## 2021-03-24 DIAGNOSIS — I10 Essential (primary) hypertension: Secondary | ICD-10-CM | POA: Diagnosis not present

## 2021-03-24 DIAGNOSIS — N1831 Chronic kidney disease, stage 3a: Secondary | ICD-10-CM | POA: Diagnosis not present

## 2021-05-09 ENCOUNTER — Telehealth: Payer: Self-pay

## 2021-05-09 ENCOUNTER — Other Ambulatory Visit: Payer: Self-pay | Admitting: Family Medicine

## 2021-05-09 DIAGNOSIS — I10 Essential (primary) hypertension: Secondary | ICD-10-CM

## 2021-05-09 DIAGNOSIS — E785 Hyperlipidemia, unspecified: Secondary | ICD-10-CM

## 2021-05-09 NOTE — Telephone Encounter (Signed)
Copied from [place] [phone]. Topic: General - Other >> May 09, 2021  3:35 PM Pawlus, Brayton Layman A wrote: Reason for CRM: Pt requested a call back from [facility], pt stated [employer] sent him an email stating he can get a larger supply of his medications, pt stated 100 pills at a time. Please advise if a larger quantity can be sent in to [facility] store.

## 2021-06-30 ENCOUNTER — Other Ambulatory Visit: Payer: Self-pay

## 2021-06-30 ENCOUNTER — Encounter: Payer: Self-pay | Admitting: Family Medicine

## 2021-06-30 ENCOUNTER — Ambulatory Visit (INDEPENDENT_AMBULATORY_CARE_PROVIDER_SITE_OTHER): Payer: Medicare HMO | Admitting: Family Medicine

## 2021-06-30 VITALS — BP 130/60 | HR 80 | Ht 72.0 in | Wt 244.0 lb

## 2021-06-30 DIAGNOSIS — E785 Hyperlipidemia, unspecified: Secondary | ICD-10-CM | POA: Diagnosis not present

## 2021-06-30 DIAGNOSIS — F3341 Major depressive disorder, recurrent, in partial remission: Secondary | ICD-10-CM | POA: Diagnosis not present

## 2021-06-30 DIAGNOSIS — J301 Allergic rhinitis due to pollen: Secondary | ICD-10-CM | POA: Diagnosis not present

## 2021-06-30 DIAGNOSIS — I10 Essential (primary) hypertension: Secondary | ICD-10-CM

## 2021-06-30 DIAGNOSIS — R69 Illness, unspecified: Secondary | ICD-10-CM | POA: Diagnosis not present

## 2021-06-30 MED ORDER — FENOFIBRATE 145 MG PO TABS: ORAL_TABLET | ORAL | 1 refills | Status: DC

## 2021-06-30 MED ORDER — AMLODIPINE BESY-BENAZEPRIL HCL 10-20 MG PO CAPS
ORAL_CAPSULE | ORAL | 1 refills | Status: DC
Start: 2021-06-30 — End: 2022-01-02

## 2021-06-30 MED ORDER — LORATADINE 10 MG PO TABS: ORAL_TABLET | ORAL | 1 refills | Status: DC

## 2021-06-30 MED ORDER — CARVEDILOL 6.25 MG PO TABS
ORAL_TABLET | ORAL | 1 refills | Status: DC
Start: 2021-06-30 — End: 2022-01-02

## 2021-06-30 MED ORDER — HYDROCHLOROTHIAZIDE 12.5 MG PO CAPS: ORAL_CAPSULE | ORAL | 1 refills | Status: DC

## 2021-06-30 MED ORDER — SERTRALINE HCL 50 MG PO TABS: 50.0000 mg | ORAL_TABLET | Freq: Every day | ORAL | 1 refills | Status: DC

## 2021-06-30 MED ORDER — ATORVASTATIN CALCIUM 20 MG PO TABS
ORAL_TABLET | ORAL | 1 refills | Status: DC
Start: 2021-06-30 — End: 2022-01-02

## 2021-06-30 MED ORDER — FLUTICASONE PROPIONATE 50 MCG/ACT NA SUSP: 1.0000 | Freq: Every day | NASAL | 5 refills | Status: DC | PRN

## 2021-06-30 NOTE — Progress Notes (Signed)
Date:  06/30/2021   Name:  Gary Kramer   DOB:  Mar 03, 1946   MRN:  [id number]   Chief Complaint: Depression, Allergic Rhinitis , Hypertension, and Hyperlipidemia  Depression        This is a chronic problem.  The current episode started more than 1 year ago.   The problem occurs rarely.The problem is unchanged.  Associated symptoms include no decreased concentration, no fatigue, no helplessness, no hopelessness, does not have insomnia, not irritable, no restlessness, no decreased interest, no appetite change, no body aches, no myalgias, no headaches, no indigestion, not sad and no suicidal ideas.     The symptoms are aggravated by nothing.  Compliance with treatment is good.   Pertinent negatives include no anxiety. Hypertension This is a chronic problem. The current episode started more than 1 year ago. The problem is unchanged. The problem is controlled. Pertinent negatives include no anxiety, chest pain, headaches, malaise/fatigue, palpitations, peripheral edema or shortness of breath. Risk factors for coronary artery disease include dyslipidemia. There are no compliance problems.   Hyperlipidemia This is a chronic problem. The current episode started more than 1 year ago. Pertinent negatives include no chest pain, leg pain, myalgias or shortness of breath. There are no compliance problems.  Risk factors for coronary artery disease include hypertension, dyslipidemia and male sex.   Lab Results  Component Value Date   CREATININE 1.35 (H) 01/06/2021   BUN 13 01/06/2021   NA 146 (H) 01/06/2021   K 4.4 01/06/2021   CL 105 01/06/2021   CO2 24 01/06/2021   Lab Results  Component Value Date   CHOL 146 01/06/2021   HDL 34 (L) 01/06/2021   LDLCALC 88 01/06/2021   TRIG 137 01/06/2021   CHOLHDL 4.1 07/16/2018   No results found for: TSH No results found for: HGBA1C Lab Results  Component Value Date   WBC 11.3 (H) 04/04/2018   HGB 9.7 (L) 04/04/2018   HCT 28.5 (L) 04/04/2018    MCV 85.9 04/04/2018   PLT 264 04/04/2018   Lab Results  Component Value Date   ALT 21 07/05/2020   AST 17 07/05/2020   ALKPHOS 91 07/05/2020   BILITOT 0.3 07/05/2020     Review of Systems  Constitutional:  Negative for appetite change, fatigue and malaise/fatigue.  Respiratory:  Negative for shortness of breath.   Cardiovascular:  Negative for chest pain and palpitations.  Musculoskeletal:  Negative for myalgias.  Neurological:  Negative for headaches.  Psychiatric/Behavioral:  Positive for depression. Negative for decreased concentration and suicidal ideas. The patient does not have insomnia.    Patient Active Problem List   Diagnosis Date Noted   Stage 3 chronic kidney disease ([place]) 09/02/2019   Obesity (BMI 30.0-34.9) 06/06/2018   Status post total hip replacement, right 04/02/2018   Presence of left artificial knee joint 01/24/2018   Recurrent major depressive disorder, in partial remission ([place]) 01/16/2018   Primary osteoarthritis of left knee 12/26/2017   Chronic allergic rhinitis 01/05/2017   Essential hypertension 07/20/2015   Hyperlipidemia 07/20/2015   Erectile dysfunction 07/20/2015   Depression 07/20/2015   Allergic rhinitis due to pollen 07/20/2015   Degenerative joint disease of hand 07/20/2015    Allergies  Allergen Reactions   Cefaclor Itching   Penicillin V Potassium Rash    Has patient had a PCN reaction causing immediate rash, facial/tongue/throat swelling, SOB or lightheadedness with hypotension: No Has patient had a PCN reaction causing severe rash involving mucus membranes or  skin necrosis: Unknown Has patient had a PCN reaction that required hospitalization: No Has patient had a PCN reaction occurring within the last 10 years: No If all of the above answers are "NO", then may proceed with Cephalosporin use.     Past Surgical History:  Procedure Laterality Date   COLONOSCOPY  2016   cleared for 5 yrs- Dr Candace Cruise   COLONOSCOPY WITH PROPOFOL N/A  03/10/2020   Procedure: COLONOSCOPY WITH PROPOFOL;  Surgeon: Jonathon Bellows, MD;  Location: [facility] ENDOSCOPY;  Service: Gastroenterology;  Laterality: N/A;   HERNIA REPAIR     JOINT REPLACEMENT Left 12/2017   TKR   JOINT REPLACEMENT Right 03/2018   Hip   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   Procedure: TOTAL HIP ARTHROPLASTY;  Surgeon: Corky Mull, MD;  Location: [facility] ORS;  Service: Orthopedics;  Laterality: Right;    Social History   Tobacco Use   Smoking status: Former    Packs/day: 1.50    Years: 30.00    Pack years: 45.00    Types: Cigarettes    Quit date: 1996    Years since quitting: 26.6   Smokeless tobacco: Never   Tobacco comments:    smoking cessatioinn materials not required  Vaping Use   Vaping Use: Never used  Substance Use Topics   Alcohol use: Yes    Alcohol/week: 0.0 standard drinks    Comment: only occasionally   Drug use: No     Medication list has been reviewed and updated.  No outpatient medications have been marked as taking for the 06/30/21 encounter (Office Visit) with Juline Patch, MD.    [facility] 2/9 Scores 06/30/2021 01/12/2021 01/06/2021 07/05/2020  PHQ - 2 Score 0 0 0 0  PHQ- 9 Score 0 - 0 0    GAD 7 : Generalized Anxiety Score 06/30/2021 01/06/2021 07/05/2020 01/05/2020  Nervous, Anxious, on Edge 0 0 0 0  Control/stop worrying 0 0 0 0  Worry too much - different things 0 0 0 0  Trouble relaxing 0 0 0 0  Restless 0 0 0 0  Easily annoyed or irritable 0 0 0 0  Afraid - awful might happen 0 0 0 0  Total GAD 7 Score 0 0 0 0    BP Readings from Last 3 Encounters:  01/12/21 110/66  01/06/21 120/78  07/05/20 124/72    Physical Exam Constitutional:      General: He is not irritable.   Wt Readings from Last 3 Encounters:  06/30/21 244 lb (110.7 kg)  01/12/21 245 lb 9.6 oz (111.4 kg)  01/06/21 247 lb (112 kg)    Ht 6' (1.829 m)   Wt 244 lb (110.7 kg)   BMI 33.09 kg/m   Assessment and Plan:

## 2021-06-30 NOTE — Progress Notes (Signed)
Date:  06/30/2021   Name:  Gary Kramer   DOB:  [birth year]   MRN:  [id number]   Chief Complaint: Depression, Allergic Rhinitis , Hypertension, and Hyperlipidemia  Depression        This is a chronic problem.  The problem occurs intermittently.  Associated symptoms include no decreased concentration, no fatigue, no helplessness, no hopelessness, does not have insomnia, not irritable, no restlessness, no decreased interest, no appetite change, no body aches, no myalgias, no headaches, no indigestion, not sad and no suicidal ideas.     The symptoms are aggravated by nothing.  Past treatments include SSRIs - Selective serotonin reuptake inhibitors (sertraline).  Compliance with treatment is good.  Previous treatment provided moderate relief.   Pertinent negatives include no anxiety. Hypertension This is a chronic problem. The current episode started more than 1 year ago. The problem has been gradually worsening since onset. The problem is controlled. Pertinent negatives include no anxiety, blurred vision, chest pain, headaches, malaise/fatigue, neck pain, orthopnea, palpitations, peripheral edema, PND, shortness of breath or sweats. There are no associated agents to hypertension. Risk factors for coronary artery disease include dyslipidemia. Past treatments include calcium channel blockers, alpha 1 blockers, beta blockers and diuretics. The current treatment provides moderate improvement. There are no compliance problems.  There is no history of angina, kidney disease, CAD/MI, CVA, heart failure, left ventricular hypertrophy, PVD or retinopathy. There is no history of chronic renal disease, a hypertension causing med or renovascular disease.  Hyperlipidemia He has no history of chronic renal disease. Pertinent negatives include no chest pain, myalgias or shortness of breath. Current antihyperlipidemic treatment includes statins. The current treatment provides moderate improvement of lipids.   Lab  Results  Component Value Date   CREATININE 1.35 (H) 01/06/2021   BUN 13 01/06/2021   NA 146 (H) 01/06/2021   K 4.4 01/06/2021   CL 105 01/06/2021   CO2 24 01/06/2021   Lab Results  Component Value Date   CHOL 146 01/06/2021   HDL 34 (L) 01/06/2021   LDLCALC 88 01/06/2021   TRIG 137 01/06/2021   CHOLHDL 4.1 07/16/2018   No results found for: TSH No results found for: HGBA1C Lab Results  Component Value Date   WBC 11.3 (H) 04/04/2018   HGB 9.7 (L) 04/04/2018   HCT 28.5 (L) 04/04/2018   MCV 85.9 04/04/2018   PLT 264 04/04/2018   Lab Results  Component Value Date   ALT 21 07/05/2020   AST 17 07/05/2020   ALKPHOS 91 07/05/2020   BILITOT 0.3 07/05/2020     Review of Systems  Constitutional:  Negative for appetite change, fatigue and malaise/fatigue.  Eyes:  Negative for blurred vision.  Respiratory:  Negative for shortness of breath.   Cardiovascular:  Negative for chest pain, palpitations, orthopnea and PND.  Musculoskeletal:  Negative for myalgias and neck pain.  Neurological:  Negative for headaches.  Psychiatric/Behavioral:  Positive for depression. Negative for decreased concentration and suicidal ideas. The patient does not have insomnia.   All other systems reviewed and are negative.  Patient Active Problem List   Diagnosis Date Noted   Stage 3 chronic kidney disease ([place]) 09/02/2019   Obesity (BMI 30.0-34.9) 06/06/2018   Status post total hip replacement, right 04/02/2018   Presence of left artificial knee joint 01/24/2018   Recurrent major depressive disorder, in partial remission ([place]) 01/16/2018   Primary osteoarthritis of left knee 12/26/2017   Chronic allergic rhinitis 01/05/2017   Essential hypertension 07/20/2015  Hyperlipidemia 07/20/2015   Erectile dysfunction 07/20/2015   Depression 07/20/2015   Allergic rhinitis due to pollen 07/20/2015   Degenerative joint disease of hand 07/20/2015    Allergies  Allergen Reactions   Cefaclor Itching    Penicillin V Potassium Rash    Has patient had a PCN reaction causing immediate rash, facial/tongue/throat swelling, SOB or lightheadedness with hypotension: No Has patient had a PCN reaction causing severe rash involving mucus membranes or skin necrosis: Unknown Has patient had a PCN reaction that required hospitalization: No Has patient had a PCN reaction occurring within the last 10 years: No If all of the above answers are "NO", then may proceed with Cephalosporin use.     Past Surgical History:  Procedure Laterality Date   COLONOSCOPY  2016   cleared for 5 yrs- Dr Candace Cruise   COLONOSCOPY WITH PROPOFOL N/A 03/10/2020   Procedure: COLONOSCOPY WITH PROPOFOL;  Surgeon: Jonathon Bellows, MD;  Location: [facility] ENDOSCOPY;  Service: Gastroenterology;  Laterality: N/A;   HERNIA REPAIR     JOINT REPLACEMENT Left 12/2017   TKR   JOINT REPLACEMENT Right 03/2018   Hip   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   Procedure: TOTAL HIP ARTHROPLASTY;  Surgeon: Corky Mull, MD;  Location: [facility] ORS;  Service: Orthopedics;  Laterality: Right;    Social History   Tobacco Use   Smoking status: Former    Packs/day: 1.50    Years: 30.00    Pack years: 45.00    Types: Cigarettes    Quit date: 1996    Years since quitting: 26.6   Smokeless tobacco: Never   Tobacco comments:    smoking cessatioinn materials not required  Vaping Use   Vaping Use: Never used  Substance Use Topics   Alcohol use: Yes    Alcohol/week: 0.0 standard drinks    Comment: only occasionally   Drug use: No     Medication list has been reviewed and updated.  No outpatient medications have been marked as taking for the 06/30/21 encounter (Office Visit) with Juline Patch, MD.    [facility] 2/9 Scores 06/30/2021 01/12/2021 01/06/2021 07/05/2020  PHQ - 2 Score 0 0 0 0  PHQ- 9 Score 0 - 0 0    GAD 7 : Generalized Anxiety Score 06/30/2021 01/06/2021 07/05/2020 01/05/2020  Nervous, Anxious, on Edge 0 0 0 0  Control/stop worrying 0 0 0 0  Worry too  much - different things 0 0 0 0  Trouble relaxing 0 0 0 0  Restless 0 0 0 0  Easily annoyed or irritable 0 0 0 0  Afraid - awful might happen 0 0 0 0  Total GAD 7 Score 0 0 0 0    BP Readings from Last 3 Encounters:  06/30/21 130/60  01/12/21 110/66  01/06/21 120/78    Physical Exam Vitals and nursing note reviewed.  Constitutional:      General: He is not irritable. HENT:     Head: Normocephalic.     Right Ear: Tympanic membrane and external ear normal. There is no impacted cerumen.     Left Ear: Tympanic membrane and external ear normal. There is no impacted cerumen.     Nose: Nose normal. No congestion or rhinorrhea.  Eyes:     General: No scleral icterus.       Right eye: No discharge.        Left eye: No discharge.     Conjunctiva/sclera: Conjunctivae normal.     Pupils: Pupils  are equal, round, and reactive to light.  Neck:     Thyroid: No thyromegaly.     Vascular: No JVD.     Trachea: No tracheal deviation.  Cardiovascular:     Rate and Rhythm: Normal rate and regular rhythm.     Heart sounds: Normal heart sounds, S1 normal and S2 normal. No murmur heard. No systolic murmur is present.  No diastolic murmur is present.    No friction rub. No gallop. No S3 or S4 sounds.  Pulmonary:     Effort: No respiratory distress.     Breath sounds: Normal breath sounds. No decreased breath sounds, wheezing, rhonchi or rales.  Abdominal:     General: Bowel sounds are normal.     Palpations: Abdomen is soft. There is no mass.     Tenderness: There is no abdominal tenderness. There is no guarding or rebound.  Musculoskeletal:        General: No tenderness. Normal range of motion.     Cervical back: Normal range of motion and neck supple.  Lymphadenopathy:     Cervical: No cervical adenopathy.  Skin:    General: Skin is warm.     Findings: No rash.  Neurological:     Mental Status: He is alert and oriented to person, place, and time.     Cranial Nerves: No cranial nerve  deficit.     Deep Tendon Reflexes: Reflexes are normal and symmetric.    Wt Readings from Last 3 Encounters:  06/30/21 244 lb (110.7 kg)  01/12/21 245 lb 9.6 oz (111.4 kg)  01/06/21 247 lb (112 kg)    BP 130/60   Pulse 80   Ht 6' (1.829 m)   Wt 244 lb (110.7 kg)   BMI 33.09 kg/m   Assessment and Plan:  1. Essential hypertension Chronic.  Controlled.  Stable.  Blood pressure reading today is 130/60.  Continue amlodipine benazepril 10-12 0.5, carvedilol 6.25 twice a day, and hydrochlorothiazide 12.5 mg once a day.  Will check CMP. - amLODipine-benazepril (LOTREL) 10-20 MG capsule; Take 1 capsule by mouth daily  Dispense: 90 capsule; Refill: 1 - carvedilol (COREG) 6.25 MG tablet; Take 1 tablet by mouth twice daily  Dispense: 180 tablet; Refill: 1 - hydrochlorothiazide (MICROZIDE) 12.5 MG capsule; TAKE (1) CAPSULE BY MOUTH EVERY DAY AT [facility] THE MORNING  Dispense: 90 capsule; Refill: 1 - Comprehensive Metabolic Panel (CMET)  2. Hyperlipidemia, unspecified hyperlipidemia type Chronic.  Controlled.  Stable.  Continue atorvastatin 20 mg once a day, and fenofibrate 145 mg once a day.  Will check lipid panel - atorvastatin (LIPITOR) 20 MG tablet; One a day  Dispense: 90 tablet; Refill: 1 - fenofibrate (TRICOR) 145 MG tablet; TAKE ONE TABLET BY MOUTH AT 6 IN THE MORNING  Dispense: 90 tablet; Refill: 1 - Lipid Panel With LDL/HDL Ratio  3. Seasonal allergic rhinitis due to pollen Chronic.  Controlled.  Stable.  Continue fluticasone Flonase as well as loratadine 10 mg once a day. - fluticasone (FLONASE) 50 MCG/ACT nasal spray; Place 1 spray into both nostrils daily as needed (for sinus/allergy issues.).  Dispense: 16 g; Refill: 5 - loratadine (ALLERGY RELIEF) 10 MG tablet; TAKE (1) TABLET BY MOUTH EVERY DAY  Dispense: 90 tablet; Refill: 1  4. Recurrent major depressive disorder, in partial remission (HCC) Chronic.  Controlled.  Stable.  PHQ is 0 Gad score is 0.  Continue sertraline 50 mg once  a day. - sertraline (ZOLOFT) 50 MG tablet; Take 1 tablet (50 mg  total) by mouth daily at 6 (six) AM.  Dispense: 90 tablet; Refill: 1

## 2021-07-01 LAB — COMPREHENSIVE METABOLIC PANEL
ALT: 16 IU/L (ref 0–44)
AST: 20 IU/L (ref 0–40)
Albumin/Globulin Ratio: 2.4 — ABNORMAL HIGH (ref 1.2–2.2)
Albumin: 4.5 g/dL (ref 3.7–4.7)
Alkaline Phosphatase: 97 IU/L (ref 44–121)
BUN/Creatinine Ratio: 12 (ref 10–24)
BUN: 18 mg/dL (ref 8–27)
Bilirubin Total: 0.5 mg/dL (ref 0.0–1.2)
CO2: 24 mmol/L (ref 20–29)
Calcium: 9.8 mg/dL (ref 8.6–10.2)
Chloride: 101 mmol/L (ref 96–106)
Creatinine, Ser: 1.46 mg/dL — ABNORMAL HIGH (ref 0.76–1.27)
Globulin, Total: 1.9 g/dL (ref 1.5–4.5)
Glucose: 162 mg/dL — ABNORMAL HIGH (ref 65–99)
Potassium: 4.6 mmol/L (ref 3.5–5.2)
Sodium: 141 mmol/L (ref 134–144)
Total Protein: 6.4 g/dL (ref 6.0–8.5)
eGFR: 50 mL/min/{1.73_m2} — ABNORMAL LOW (ref >59)

## 2021-07-01 LAB — LIPID PANEL WITH LDL/HDL RATIO
Cholesterol, Total: 171 mg/dL (ref 100–199)
HDL: 35 mg/dL — ABNORMAL LOW (ref >39)
LDL Chol Calc (NIH): 99 mg/dL (ref 0–99)
LDL/HDL Ratio: 2.8 ratio (ref 0.0–3.6)
Triglycerides: 212 mg/dL — ABNORMAL HIGH (ref 0–149)
VLDL Cholesterol Cal: 37 mg/dL (ref 5–40)

## 2021-07-26 DIAGNOSIS — N1831 Chronic kidney disease, stage 3a: Secondary | ICD-10-CM | POA: Diagnosis not present

## 2021-07-26 DIAGNOSIS — I10 Essential (primary) hypertension: Secondary | ICD-10-CM | POA: Diagnosis not present

## 2021-08-02 ENCOUNTER — Ambulatory Visit (INDEPENDENT_AMBULATORY_CARE_PROVIDER_SITE_OTHER): Payer: Medicare HMO

## 2021-08-02 DIAGNOSIS — Z23 Encounter for immunization: Secondary | ICD-10-CM

## 2021-08-18 ENCOUNTER — Telehealth: Payer: Self-pay

## 2021-08-18 NOTE — Telephone Encounter (Signed)
Copied from [place] [id number]. Topic: General - Other >> Aug 18, 2021  3:37 PM Celene Kras wrote: Reason for CRM: Pt called stating that he received a call from [place]. He is requesting to have a call back. Please advise.

## 2021-11-29 DIAGNOSIS — I10 Essential (primary) hypertension: Secondary | ICD-10-CM | POA: Diagnosis not present

## 2021-11-29 DIAGNOSIS — N1831 Chronic kidney disease, stage 3a: Secondary | ICD-10-CM | POA: Diagnosis not present

## 2022-01-02 ENCOUNTER — Encounter: Payer: Self-pay | Admitting: Family Medicine

## 2022-01-02 ENCOUNTER — Ambulatory Visit (INDEPENDENT_AMBULATORY_CARE_PROVIDER_SITE_OTHER): Payer: Medicare HMO | Admitting: Family Medicine

## 2022-01-02 ENCOUNTER — Other Ambulatory Visit: Payer: Self-pay

## 2022-01-02 VITALS — BP 132/62 | HR 68 | Ht 72.0 in | Wt 247.0 lb

## 2022-01-02 DIAGNOSIS — E785 Hyperlipidemia, unspecified: Secondary | ICD-10-CM | POA: Diagnosis not present

## 2022-01-02 DIAGNOSIS — I10 Essential (primary) hypertension: Secondary | ICD-10-CM

## 2022-01-02 DIAGNOSIS — R69 Illness, unspecified: Secondary | ICD-10-CM | POA: Diagnosis not present

## 2022-01-02 DIAGNOSIS — J301 Allergic rhinitis due to pollen: Secondary | ICD-10-CM

## 2022-01-02 DIAGNOSIS — F3341 Major depressive disorder, recurrent, in partial remission: Secondary | ICD-10-CM | POA: Diagnosis not present

## 2022-01-02 DIAGNOSIS — Z23 Encounter for immunization: Secondary | ICD-10-CM

## 2022-01-02 MED ORDER — SERTRALINE HCL 50 MG PO TABS: 50.0000 mg | ORAL_TABLET | Freq: Every day | ORAL | 1 refills | Status: DC

## 2022-01-02 MED ORDER — FENOFIBRATE 145 MG PO TABS: ORAL_TABLET | ORAL | 1 refills | Status: DC

## 2022-01-02 MED ORDER — ATORVASTATIN CALCIUM 20 MG PO TABS: ORAL_TABLET | ORAL | 1 refills | Status: DC

## 2022-01-02 MED ORDER — CARVEDILOL 6.25 MG PO TABS: ORAL_TABLET | ORAL | 1 refills | Status: DC

## 2022-01-02 MED ORDER — HYDROCHLOROTHIAZIDE 12.5 MG PO CAPS: ORAL_CAPSULE | ORAL | 1 refills | Status: DC

## 2022-01-02 MED ORDER — LORATADINE 10 MG PO TABS: ORAL_TABLET | ORAL | 1 refills | Status: DC

## 2022-01-02 MED ORDER — AMLODIPINE BESY-BENAZEPRIL HCL 10-20 MG PO CAPS: ORAL_CAPSULE | ORAL | 1 refills | Status: DC

## 2022-01-02 MED ORDER — FLUTICASONE PROPIONATE 50 MCG/ACT NA SUSP: 1.0000 | Freq: Every day | NASAL | 5 refills | Status: DC | PRN

## 2022-01-02 NOTE — Progress Notes (Signed)
Date:  01/02/2022   Name:  Gary Kramer   DOB:  [birth year]   MRN:  [id number]   Chief Complaint: Depression, Allergic Rhinitis , Hypertension, Hyperlipidemia, and pneum vacc  (20 )  Depression        This is a chronic problem.  The current episode started more than 1 year ago.   The onset quality is sudden.   The problem occurs intermittently.  The problem has been gradually improving since onset.  Associated symptoms include no decreased concentration, no fatigue, no helplessness, no hopelessness, does not have insomnia, not irritable, no restlessness, no decreased interest, no appetite change, no body aches, no myalgias, no headaches, no indigestion, not sad and no suicidal ideas.     The symptoms are aggravated by nothing.  Past treatments include SSRIs - Selective serotonin reuptake inhibitors.  Compliance with treatment is good.  Previous treatment provided moderate relief.   Pertinent negatives include no hypothyroidism and no anxiety. Hypertension This is a chronic problem. The problem has been gradually improving since onset. The problem is controlled. Pertinent negatives include no anxiety, blurred vision, chest pain, headaches, malaise/fatigue, neck pain, orthopnea, palpitations, peripheral edema, PND, shortness of breath or sweats. There are no associated agents to hypertension. Risk factors for coronary artery disease include dyslipidemia. Past treatments include calcium channel blockers, ACE inhibitors, beta blockers, alpha 1 blockers and diuretics. The current treatment provides moderate improvement. There are no compliance problems.  There is no history of angina, kidney disease, CAD/MI, CVA, heart failure, left ventricular hypertrophy, PVD or retinopathy. There is no history of chronic renal disease, a hypertension causing med or renovascular disease.  Hyperlipidemia This is a chronic problem. The current episode started more than 1 year ago. The problem is controlled. Recent  lipid tests were reviewed and are normal. He has no history of chronic renal disease, diabetes, hypothyroidism, liver disease, obesity or nephrotic syndrome. Factors aggravating his hyperlipidemia include thiazides. Pertinent negatives include no chest pain, focal sensory loss, focal weakness, leg pain, myalgias or shortness of breath. Current antihyperlipidemic treatment includes statins. The current treatment provides moderate improvement of lipids. There are no compliance problems.  Risk factors for coronary artery disease include dyslipidemia.   Lab Results  Component Value Date   NA 141 06/30/2021   K 4.6 06/30/2021   CO2 24 06/30/2021   GLUCOSE 162 (H) 06/30/2021   BUN 18 06/30/2021   CREATININE 1.46 (H) 06/30/2021   CALCIUM 9.8 06/30/2021   EGFR 50 (L) 06/30/2021   GFRNONAA 52 (L) 01/06/2021   Lab Results  Component Value Date   CHOL 171 06/30/2021   HDL 35 (L) 06/30/2021   LDLCALC 99 06/30/2021   TRIG 212 (H) 06/30/2021   CHOLHDL 4.1 07/16/2018   No results found for: TSH No results found for: HGBA1C Lab Results  Component Value Date   WBC 11.3 (H) 04/04/2018   HGB 9.7 (L) 04/04/2018   HCT 28.5 (L) 04/04/2018   MCV 85.9 04/04/2018   PLT 264 04/04/2018   Lab Results  Component Value Date   ALT 16 06/30/2021   AST 20 06/30/2021   ALKPHOS 97 06/30/2021   BILITOT 0.5 06/30/2021   No results found for: 25OHVITD2, 25OHVITD3, VD25OH   Review of Systems  Constitutional:  Negative for appetite change, chills, fatigue, fever and malaise/fatigue.  HENT:  Negative for drooling, ear discharge, ear pain and sore throat.   Eyes:  Negative for blurred vision.  Respiratory:  Negative for cough, shortness  of breath and wheezing.   Cardiovascular:  Negative for chest pain, palpitations, orthopnea, leg swelling and PND.  Gastrointestinal:  Negative for abdominal pain, blood in stool, constipation, diarrhea and nausea.  Endocrine: Negative for polydipsia.  Genitourinary:  Negative  for dysuria, frequency, hematuria and urgency.  Musculoskeletal:  Negative for back pain, myalgias and neck pain.  Skin:  Negative for rash.  Allergic/Immunologic: Negative for environmental allergies.  Neurological:  Negative for dizziness, focal weakness and headaches.  Hematological:  Does not bruise/bleed easily.  Psychiatric/Behavioral:  Positive for depression. Negative for decreased concentration and suicidal ideas. The patient is not nervous/anxious and does not have insomnia.    Patient Active Problem List   Diagnosis Date Noted   Stage 3 chronic kidney disease ([place]) 09/02/2019   Obesity (BMI 30.0-34.9) 06/06/2018   Status post total hip replacement, right 04/02/2018   Presence of left artificial knee joint 01/24/2018   Recurrent major depressive disorder, in partial remission ([place]) 01/16/2018   Primary osteoarthritis of left knee 12/26/2017   Chronic allergic rhinitis 01/05/2017   Essential hypertension 07/20/2015   Hyperlipidemia 07/20/2015   Erectile dysfunction 07/20/2015   Depression 07/20/2015   Allergic rhinitis due to pollen 07/20/2015   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     Past Surgical History:  Procedure Laterality Date   COLONOSCOPY  2016   cleared for 5 yrs- Dr Candace Cruise   COLONOSCOPY WITH PROPOFOL N/A 03/10/2020   Procedure: COLONOSCOPY WITH PROPOFOL;  Surgeon: Jonathon Bellows, MD;  Location: [facility];  Service:  Gastroenterology;  Laterality: N/A;   HERNIA REPAIR     JOINT REPLACEMENT Left 12/2017   TKR   JOINT REPLACEMENT Right 03/2018   Hip   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   Procedure: TOTAL HIP ARTHROPLASTY;  Surgeon: Corky Mull, MD;  Location: [facility] ORS;  Service: Orthopedics;  Laterality: Right;    Social History   Tobacco Use   Smoking status: Former    Packs/day: 1.50    Years: 30.00    Pack years: 45.00    Types: Cigarettes    Quit date: 1996    Years since quitting: 27.1   Smokeless tobacco: Never   Tobacco comments:    smoking cessatioinn materials not required  Vaping Use   Vaping Use: Never used  Substance Use Topics   Alcohol use: Yes    Alcohol/week: 0.0 standard drinks    Comment: only occasionally   Drug use: No     Medication list has been reviewed and updated.  Current Meds  Medication Sig   amLODipine-benazepril (LOTREL) 10-20 MG capsule Take 1 capsule by mouth daily   Ascorbic Acid (VITAMIN C) 100 MG tablet Take 1,000 mg by mouth daily.    aspirin EC 81 MG tablet Take 81 mg by mouth daily.    atorvastatin (LIPITOR) 20 MG tablet One a day   carvedilol (COREG) 6.25 MG tablet Take 1 tablet by mouth twice daily   cholecalciferol (VITAMIN  D) 400 units TABS tablet Take 1,000 Units by mouth daily.    diclofenac Sodium (VOLTAREN) 1 % GEL Apply topically. PRN   fenofibrate (TRICOR) 145 MG tablet TAKE ONE TABLET BY MOUTH AT 6 IN THE MORNING   fluticasone (FLONASE) 50 MCG/ACT nasal spray Place 1 spray into both nostrils daily as needed (for sinus/allergy issues.).   Garlic 229 MG CAPS Take 1,000 mg by mouth daily.    hydrochlorothiazide (MICROZIDE) 12.5 MG capsule TAKE (1) CAPSULE BY MOUTH EVERY DAY AT [facility] THE MORNING   loratadine (ALLERGY RELIEF) 10 MG tablet TAKE (1) TABLET BY MOUTH EVERY DAY   Multiple Vitamin (MULTI-VITAMINS) TABS Take 1 tablet by mouth daily at 6 (six) AM.   Omega-3 Fatty Acids (FISH OIL MAXIMUM STRENGTH) 1200 MG CPDR    sertraline  (ZOLOFT) 50 MG tablet Take 1 tablet (50 mg total) by mouth daily at 6 (six) AM.    PHQ 2/9 Scores 01/02/2022 06/30/2021 01/12/2021 01/06/2021  PHQ - 2 Score 0 0 0 0  PHQ- 9 Score 0 0 - 0    GAD 7 : Generalized Anxiety Score 01/02/2022 06/30/2021 01/06/2021 07/05/2020  Nervous, Anxious, on Edge 0 0 0 0  Control/stop worrying 0 0 0 0  Worry too much - different things 0 0 0 0  Trouble relaxing 0 0 0 0  Restless 0 0 0 0  Easily annoyed or irritable 0 0 0 0  Afraid - awful might happen 0 0 0 0  Total GAD 7 Score 0 0 0 0  Anxiety Difficulty Not difficult at all - - -    BP Readings from Last 3 Encounters:  01/02/22 132/62  06/30/21 130/60  01/12/21 110/66    Physical Exam Vitals and nursing note reviewed.  Constitutional:      General: He is not irritable. HENT:     Head: Normocephalic.     Right Ear: Tympanic membrane, ear canal and external ear normal. There is no impacted cerumen.     Left Ear: Tympanic membrane, ear canal and external ear normal. There is no impacted cerumen.     Nose: Nose normal. No congestion or rhinorrhea.  Eyes:     General: No scleral icterus.       Right eye: No discharge.        Left eye: No discharge.     Conjunctiva/sclera: Conjunctivae normal.     Pupils: Pupils are equal, round, and reactive to light.  Neck:     Thyroid: No thyromegaly.     Vascular: No JVD.     Trachea: No tracheal deviation.  Cardiovascular:     Rate and Rhythm: Normal rate and regular rhythm.     Heart sounds: Normal heart sounds. No murmur heard.   No friction rub. No gallop.  Pulmonary:     Effort: No respiratory distress.     Breath sounds: Normal breath sounds. No stridor. No wheezing, rhonchi or rales.  Abdominal:     General: Bowel sounds are normal.     Palpations: Abdomen is soft. There is no mass.     Tenderness: There is no abdominal tenderness. There is no guarding or rebound.  Musculoskeletal:        General: No tenderness. Normal range of motion.     Cervical  back: Normal range of motion and neck supple.  Lymphadenopathy:     Cervical: No cervical adenopathy.  Skin:    General: Skin is warm.     Findings: No rash.  Neurological:     Mental Status: He is alert and oriented to person, place, and time.     Cranial Nerves: No cranial nerve deficit.     Deep Tendon Reflexes: Reflexes are normal and symmetric.    Wt Readings from Last 3 Encounters:  01/02/22 247 lb (112 kg)  06/30/21 244 lb (110.7 kg)  01/12/21 245 lb 9.6 oz (111.4 kg)    BP 132/62    Pulse 68    Ht 6' (1.829 m)    Wt 247 lb (112 kg)    BMI 33.50 kg/m   Assessment and Plan:  1. Essential hypertension Chronic.  Controlled.  Stable.  Blood pressure today is 132/62.  Continue amlodipine benazepril 10-20, carvedilol 6.251 twice a day, and hydrochlorothiazide 12.5 mg once a day.  Review of previous renal function panel is normal. - amLODipine-benazepril (LOTREL) 10-20 MG capsule; Take 1 capsule by mouth daily  Dispense: 90 capsule; Refill: 1 - carvedilol (COREG) 6.25 MG tablet; Take 1 tablet by mouth twice daily  Dispense: 180 tablet; Refill: 1 - hydrochlorothiazide (MICROZIDE) 12.5 MG capsule; TAKE (1) CAPSULE BY MOUTH EVERY DAY AT [facility] THE MORNING  Dispense: 90 capsule; Refill: 1  2. Hyperlipidemia, unspecified hyperlipidemia type Chronic.  Controlled.  Stable.  Continue atorvastatin 20 mg once a day.  And we will also continue fenofibrate 145 mg once a day.  Review of previous lipid panel is acceptable. - atorvastatin (LIPITOR) 20 MG tablet; One a day  Dispense: 90 tablet; Refill: 1 - fenofibrate (TRICOR) 145 MG tablet; TAKE ONE TABLET BY MOUTH AT 6 IN THE MORNING  Dispense: 90 tablet; Refill: 1  3. Seasonal allergic rhinitis due to pollen Chronic.  Controlled.  Stable.  Continue loratadine 10 mg once a day.  And also fluticasone nasal spray as directed. - fluticasone (FLONASE) 50 MCG/ACT nasal spray; Place 1 spray into both nostrils daily as needed (for sinus/allergy issues.).   Dispense: 16 g; Refill: 5 - loratadine (ALLERGY RELIEF) 10 MG tablet; TAKE (1) TABLET BY MOUTH EVERY DAY  Dispense: 90 tablet; Refill: 1  4. Recurrent major depressive disorder, in partial remission (HCC) Chronic.  Controlled.  Stable.  PHQ 0 gad score 0 continue sertraline 50 mg once a day. - sertraline (ZOLOFT) 50 MG tablet; Take 1 tablet (50 mg total) by mouth daily at 6 (six) AM.  Dispense: 90 tablet; Refill: 1  5. Need for pneumococcal vaccination Discussed and administered. - Pneumococcal conjugate vaccine 20-valent (Prevnar 20)

## 2022-01-16 ENCOUNTER — Ambulatory Visit (INDEPENDENT_AMBULATORY_CARE_PROVIDER_SITE_OTHER): Payer: Medicare HMO

## 2022-01-16 DIAGNOSIS — Z0000 Encounter for general adult medical examination without abnormal findings: Secondary | ICD-10-CM

## 2022-01-16 NOTE — Progress Notes (Signed)
Subjective:   Gary Kramer is a 76 y.o. male who presents for Medicare Annual/Subsequent preventive examination.  Virtual Visit via Telephone Note  I connected with  Gary Kramer on 01/16/22 at  2:40 PM EST by telephone and verified that I am speaking with the correct person using two identifiers.  Location: Patient: home Provider: [facility] Persons participating in the virtual visit: Gary Kramer   I discussed the limitations, risks, security and privacy concerns of performing an evaluation and management service by telephone and the availability of in person appointments. The patient expressed understanding and agreed to proceed.  Interactive audio and video telecommunications were attempted between this nurse and patient, however failed, due to patient having technical difficulties OR patient did not have access to video capability.  We continued and completed visit with audio only.  Some vital signs may be absent or patient reported.   Gary Marker, LPN   Review of Systems     Cardiac Risk Factors include: advanced age (>40men, >31 women);obesity (BMI >30kg/m2);dyslipidemia;male gender;hypertension     Objective:    Today's Vitals   01/16/22 1447  PainSc: 2    There is no height or weight on file to calculate BMI.  Advanced Directives 01/16/2022 01/12/2021 01/12/2020 01/08/2019 04/02/2018 03/20/2018 01/07/2018  Does Patient Have a Medical Advance Directive? Yes Yes Yes Yes Yes Yes No  Type of Paramedic of [place];Living will [place];Living will [place];Living will [place];Living will [place];Living will [place];Living will -  Does patient want to make changes to medical advance directive? - - - - No - Patient declined - -  Copy of [place] in Chart? Yes - validated most recent copy scanned in chart (See row  information) Yes - validated most recent copy scanned in chart (See row information) Yes - validated most recent copy scanned in chart (See row information) Yes - validated most recent copy scanned in chart (See row information) Yes No - copy requested -  Would patient like information on creating a medical advance directive? - - - - - - Yes (MAU/Ambulatory/Procedural Areas - Information given)    Current Medications (verified) Outpatient Encounter Medications as of 01/16/2022  Medication Sig   amLODipine-benazepril (LOTREL) 10-20 MG capsule Take 1 capsule by mouth daily   Ascorbic Acid (VITAMIN C) 100 MG tablet Take 1,000 mg by mouth daily.    aspirin EC 81 MG tablet Take 81 mg by mouth daily.    atorvastatin (LIPITOR) 20 MG tablet One a day   carvedilol (COREG) 6.25 MG tablet Take 1 tablet by mouth twice daily   cholecalciferol (VITAMIN D) 400 units TABS tablet Take 1,000 Units by mouth daily.    diclofenac Sodium (VOLTAREN) 1 % GEL Apply topically. PRN   fenofibrate (TRICOR) 145 MG tablet TAKE ONE TABLET BY MOUTH AT 6 IN THE MORNING   fluticasone (FLONASE) 50 MCG/ACT nasal spray Place 1 spray into both nostrils daily as needed (for sinus/allergy issues.).   Garlic 426 MG CAPS Take 1,000 mg by mouth daily.    hydrochlorothiazide (MICROZIDE) 12.5 MG capsule TAKE (1) CAPSULE BY MOUTH EVERY DAY AT [place] THE MORNING   loratadine (ALLERGY RELIEF) 10 MG tablet TAKE (1) TABLET BY MOUTH EVERY DAY   Multiple Vitamin (MULTI-VITAMINS) TABS Take 1 tablet by mouth daily at 6 (six) AM.   Omega-3 Fatty Acids (FISH OIL MAXIMUM STRENGTH) 1200 MG CPDR  sertraline (ZOLOFT) 50 MG tablet Take 1 tablet (50 mg total) by mouth daily at 6 (six) AM.   [DISCONTINUED] amLODipine-benazepril (LOTREL) 10-20 MG capsule TAKE (1) CAPSULE BY MOUTH EVERY DAY   [DISCONTINUED] atorvastatin (LIPITOR) 20 MG tablet One a day   [DISCONTINUED] carvedilol (COREG) 6.25 MG tablet TAKE (1) TABLET BY MOUTH TWICE DAILY   [DISCONTINUED]  fenofibrate (TRICOR) 145 MG tablet TAKE 1 TABLET EVERY MORNING AT 6AM.   [DISCONTINUED] fluticasone (FLONASE) 50 MCG/ACT nasal spray Place 1 spray into both nostrils daily as needed (for sinus/allergy issues.).   [DISCONTINUED] hydrochlorothiazide (MICROZIDE) 12.5 MG capsule TAKE (1) CAPSULE BY MOUTH EVERY DAY AT 6AM   [DISCONTINUED] loratadine (ALLERGY RELIEF) 10 MG tablet TAKE (1) TABLET BY MOUTH EVERY DAY   [DISCONTINUED] sertraline (ZOLOFT) 50 MG tablet Take 1 tablet (50 mg total) by mouth daily at 6 (six) AM.   No facility-administered encounter medications on file as of 01/16/2022.    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   TKR   JOINT REPLACEMENT Right 03/2018   Hip   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   Procedure: TOTAL HIP ARTHROPLASTY;  Surgeon: Corky Mull, MD;  Location: [facility] ORS;  Service: Orthopedics;  Laterality: Right;   Family History  Problem Relation Age of Onset   Cancer Mother        brain   Stroke Father    Social History   Socioeconomic History   Marital status: Widowed    Spouse name: Not on file   Number of children: 2   Years of education: Not on file   Highest education level: Bachelor's degree (e.g., BA, AB, BS)  Occupational History   Occupation: Retired  Tobacco Use   Smoking status: Former    Packs/day: 1.50    Years: 30.00    Pack years: 45.00    Types: Cigarettes    Quit date: 1996    Years since quitting: 27.1   Smokeless tobacco:  Never   Tobacco comments:    smoking cessatioinn materials not required  Vaping Use   Vaping Use: Never used  Substance and Sexual Activity   Alcohol use: Yes    Alcohol/week: 0.0 standard drinks    Comment: only occasionally   Drug use: No   Sexual activity: Not Currently  Other Topics Concern   Not on file  Social History Narrative   Pt lives alone   Social Determinants of Health   Financial Resource Strain: Low Risk    Difficulty of Paying Living Expenses: Not hard at all  Food Insecurity: No Food Insecurity   Worried About Charity fundraiser in the Last Year: Never true   Arboriculturist in the Last Year: Never true  Transportation Needs: No Transportation Needs   Lack of Transportation (Medical): No   Lack of Transportation (Non-Medical): No  Physical Activity: Inactive   Days of Exercise per Week: 0 days  Minutes of Exercise per Session: 0 min  Stress: No Stress Concern Present   Feeling of Stress : Not at all  Social Connections: Socially Isolated   Frequency of Communication with Friends and Family: More than three times a week   Frequency of Social Gatherings with Friends and Family: Once a week   Attends Religious Services: Never   Marine scientist or Organizations: No   Attends Archivist Meetings: Never   Marital Status: Widowed    Tobacco Counseling Counseling given: Not Answered Tobacco comments: smoking cessatioinn materials not required   Clinical Intake:  Pre-visit preparation completed: Yes  Pain : 0-10 Pain Score: 2  Pain Type: Chronic pain Pain Location: Hip Pain Orientation: Right Pain Descriptors / Indicators: Aching, Sore Pain Onset: More than a month ago Pain Frequency: Constant     Nutritional Risks: None Diabetes: No  How often do you need to have someone help you when you read instructions, pamphlets, or other written materials from your doctor or pharmacy?: 1 - Never    Interpreter Needed?:  No  Information entered by :: Gary Marker LPN   Activities of Daily Living In your present state of health, do you have any difficulty performing the following activities: 01/16/2022  Hearing? N  Vision? N  Difficulty concentrating or making decisions? N  Walking or climbing stairs? N  Dressing or bathing? N  Doing errands, shopping? N  Preparing Food and eating ? N  Using the Toilet? N  In the past six months, have you accidently leaked urine? N  Do you have problems with loss of bowel control? N  Managing your Medications? N  Managing your Finances? N  Housekeeping or managing your Housekeeping? N  Some recent data might be hidden    Patient Care Team: Juline Patch, MD as PCP - General (Family Medicine) Anthonette Legato, MD (Nephrology) Jonathon Bellows, MD as Consulting Physician (Gastroenterology)  Indicate any recent Medical Services you may have received from other than [facility] providers in the past year (date may be approximate).     Assessment:   This is a routine wellness examination for [place].  Hearing/Vision screen Hearing Screening - Comments:: Pt denies hearing difficulty  Vision Screening - Comments:: Past due for eye exam; not established with provider  Dietary issues and exercise activities discussed: Current Exercise Habits: The patient does not participate in regular exercise at present, Exercise limited by: orthopedic condition(s)   Goals Addressed             This Visit's Progress    DIET - INCREASE WATER INTAKE   On track    Recommend to drink at least 6-8 8oz glasses of water per day.     Increase physical activity   Not on track    Pt would like to increase physical activity over the next year as tolerated.       Depression Screen PHQ 2/9 Scores 01/16/2022 01/02/2022 06/30/2021 01/12/2021 01/06/2021 07/05/2020 01/12/2020  PHQ - 2 Score 0 0 0 0 0 0 0  PHQ- 9 Score 0 0 0 - 0 0 -    Fall Risk Fall Risk  01/16/2022 06/30/2021 01/12/2021 01/06/2021  07/05/2020  Falls in the past year? 0 0 1 0 0  Number falls in past yr: 0 0 1 0 -  Comment - - - - -  Injury with Fall? 0 0 0 0 -  Risk for fall due to : No Fall Risks No Fall Risks History  of fall(s) - -  Risk for fall due to: Comment - - - - -  Follow up Falls prevention discussed Falls evaluation completed Falls prevention discussed Falls evaluation completed Falls evaluation completed    [place]:  Any stairs in or around the home? Yes  If so, are there any without handrails? No  Home free of loose throw rugs in walkways, pet beds, electrical cords, etc? Yes  Adequate lighting in your home to reduce risk of falls? Yes   ASSISTIVE DEVICES UTILIZED TO PREVENT FALLS:  Life alert? No  Use of a cane, walker or w/c? No  Grab bars in the bathroom? Yes  Shower chair or bench in shower? No  Elevated toilet seat or a handicapped toilet? No   TIMED UP AND GO:  Was the test performed? No . Telephonic visit.   Cognitive Function: Normal cognitive status assessed by direct observation by this Nurse Health Advisor. No abnormalities found.       6CIT Screen 01/12/2020 01/08/2019 01/07/2018 01/05/2017  What Year? 0 points 0 points 0 points 0 points  What month? 0 points 0 points 0 points 0 points  What time? 0 points 0 points 0 points 0 points  Count back from 20 0 points 0 points 0 points 0 points  Months in reverse 0 points 0 points 0 points 0 points  Repeat phrase 0 points 0 points 0 points 0 points  Total Score 0 0 0 0    Immunizations Immunization History  Administered Date(s) Administered   Fluad Quad(high Dose 65+) 08/05/2019, 08/03/2020, 08/02/2021   Influenza, High Dose Seasonal PF 08/17/2017, 08/05/2018   Influenza,inj,Quad PF,6+ Mos 09/13/2015, 08/28/2016   PFIZER(Purple Top)SARS-COV-2 Vaccination 01/23/2020, 02/24/2020, 08/27/2020, 06/27/2021   PNEUMOCOCCAL CONJUGATE-20 01/02/2022   Pneumococcal Conjugate-13 09/18/2014   Pneumococcal  Polysaccharide-23 12/28/2012, 01/07/2018   Tdap 01/05/2017   Zoster Recombinat (Shingrix) 02/08/2017, 06/13/2017    TDAP status: Up to date  Flu Vaccine status: Up to date  Pneumococcal vaccine status: Up to date  Covid-19 vaccine status: Completed vaccines  Qualifies for Shingles Vaccine? Yes   Zostavax completed No   Shingrix Completed?: Yes  Screening Tests Health Maintenance  Topic Date Due   Hepatitis C Screening  Never done   COVID-19 Vaccine (5 - Booster for [place] series) 08/22/2021   COLONOSCOPY (Pts 45-37yrs Insurance coverage will need to be confirmed)  06/30/2022 (Originally 03/10/2021)   TETANUS/TDAP  01/05/2027   Pneumonia Vaccine 73+ Years old  Completed   INFLUENZA VACCINE  Completed   Zoster Vaccines- Shingrix  Completed   HPV VACCINES  Aged Out    Health Maintenance  Health Maintenance Due  Topic Date Due   Hepatitis C Screening  Never done   COVID-19 Vaccine (5 - Booster for [place] series) 08/22/2021    Colorectal cancer screening: Type of screening: Colonoscopy. Completed 03/10/20. Repeat every 1 years. Pt requests to postpone screening until next year; aware to contact [facility] to schedule.   Lung Cancer Screening: (Low Dose CT Chest recommended if Age 68-80 years, 30 pack-year currently smoking OR have quit w/in 15years.) does not qualify.   Additional Screening:  Hepatitis C Screening: does qualify; postponed  Vision Screening: Recommended annual ophthalmology exams for early detection of glaucoma and other disorders of the eye. Is the patient up to date with their annual eye exam?  no Who is the provider or what is the name of the office in which the patient attends annual eye  exams? Not established If pt is not established with a provider, would they like to be referred to a provider to establish care? No .   Dental Screening: Recommended annual dental exams for proper oral hygiene  Community Resource Referral / Chronic Care  Management: CRR required this visit?  No   CCM required this visit?  No      Plan:     I have personally reviewed and noted the following in the patients chart:   Medical and social history Use of alcohol, tobacco or illicit drugs  Current medications and supplements including opioid prescriptions. Patient is not currently taking opioid prescriptions. Functional ability and status Nutritional status Physical activity Advanced directives List of other physicians Hospitalizations, surgeries, and ER visits in previous 12 months Vitals Screenings to include cognitive, depression, and falls Referrals and appointments  In addition, I have reviewed and discussed with patient certain preventive protocols, quality metrics, and best practice recommendations. A written personalized care plan for preventive services as well as general preventive health recommendations were provided to patient.   Due to this being a telephonic visit, the after visit summary with patients personalized plan was offered to patient via mail or my-chart. Patient declined at this time.  Gary Marker, LPN   5/57/3220   Nurse Notes: none

## 2022-01-16 NOTE — Patient Instructions (Signed)
Gary Kramer , Thank you for taking time to come for your Medicare Wellness Visit. I appreciate your ongoing commitment to your health goals. Please review the following plan we discussed and let me know if I can assist you in the future.   Screening recommendations/referrals: Colonoscopy: done 03/10/20. Due for repeat screening colonoscopy. Please contact [facility] at [phone] to schedule.  Recommended yearly ophthalmology/optometry visit for glaucoma screening and checkup Recommended yearly dental visit for hygiene and checkup  Vaccinations: Influenza vaccine: done 08/02/21 Pneumococcal vaccine: done 01/02/22 Tdap vaccine: done 01/05/17 Shingles vaccine: done 02/08/17 &    Covid-19: done 01/23/20, 02/24/20, 08/27/20 & 06/27/21  Conditions/risks identified: Recommend increasing physical activity as tolerated   Next appointment: Follow up in one year for your annual wellness visit.   Preventive Care 41 Years and Older, Male Preventive care refers to lifestyle choices and visits with your health care provider that can promote health and wellness. What does preventive care include? A yearly physical exam. This is also called an annual well check. Dental exams once or twice a year. Routine eye exams. Ask your health care provider how often you should have your eyes checked. Personal lifestyle choices, including: Daily care of your teeth and gums. Regular physical activity. Eating a healthy diet. Avoiding tobacco and drug use. Limiting alcohol use. Practicing safe sex. Taking low doses of aspirin every day. Taking vitamin and mineral supplements as recommended by your health care provider. What happens during an annual well check? The services and screenings done by your health care provider during your annual well check will depend on your age, overall health, lifestyle risk factors, and family history of disease. Counseling  Your health care provider may ask you questions about  your: Alcohol use. Tobacco use. Drug use. Emotional well-being. Home and relationship well-being. Sexual activity. Eating habits. History of falls. Memory and ability to understand (cognition). Work and work Statistician. Screening  You may have the following tests or measurements: Height, weight, and BMI. Blood pressure. Lipid and cholesterol levels. These may be checked every 5 years, or more frequently if you are over 37 years old. Skin check. Lung cancer screening. You may have this screening every year starting at age 69 if you have a 30-pack-year history of smoking and currently smoke or have quit within the past 15 years. Fecal occult blood test (FOBT) of the stool. You may have this test every year starting at age 87. Flexible sigmoidoscopy or colonoscopy. You may have a sigmoidoscopy every 5 years or a colonoscopy every 10 years starting at age 16. Prostate cancer screening. Recommendations will vary depending on your family history and other risks. Hepatitis C blood test. Hepatitis B blood test. Sexually transmitted disease (STD) testing. Diabetes screening. This is done by checking your blood sugar (glucose) after you have not eaten for a while (fasting). You may have this done every 1-3 years. Abdominal aortic aneurysm (AAA) screening. You may need this if you are a current or former smoker. Osteoporosis. You may be screened starting at age 29 if you are at high risk. Talk with your health care provider about your test results, treatment options, and if necessary, the need for more tests. Vaccines  Your health care provider may recommend certain vaccines, such as: Influenza vaccine. This is recommended every year. Tetanus, diphtheria, and acellular pertussis (Tdap, Td) vaccine. You may need a Td booster every 10 years. Zoster vaccine. You may need this after age 74. Pneumococcal 13-valent conjugate (PCV13) vaccine. One dose  is recommended after age 36. Pneumococcal  polysaccharide (PPSV23) vaccine. One dose is recommended after age 52. Talk to your health care provider about which screenings and vaccines you need and how often you need them. This information is not intended to replace advice given to you by your health care provider. Make sure you discuss any questions you have with your health care provider. Document Released: 12/10/2015 Document Revised: 08/02/2016 Document Reviewed: 09/14/2015 Elsevier Interactive Patient Education  2017 [place] Prevention in the Home Falls can cause injuries. They can happen to people of all ages. There are many things you can do to make your home safe and to help prevent falls. What can I do on the outside of my home? Regularly fix the edges of walkways and driveways and fix any cracks. Remove anything that might make you trip as you walk through a door, such as a raised step or threshold. Trim any bushes or trees on the path to your home. Use bright outdoor lighting. Clear any walking paths of anything that might make someone trip, such as rocks or tools. Regularly check to see if handrails are loose or broken. Make sure that both sides of any steps have handrails. Any raised decks and porches should have guardrails on the edges. Have any leaves, snow, or ice cleared regularly. Use sand or salt on walking paths during winter. Clean up any spills in your garage right away. This includes oil or grease spills. What can I do in the bathroom? Use night lights. Install grab bars by the toilet and in the tub and shower. Do not use towel bars as grab bars. Use non-skid mats or decals in the tub or shower. If you need to sit down in the shower, use a plastic, non-slip stool. Keep the floor dry. Clean up any water that spills on the floor as soon as it happens. Remove soap buildup in the tub or shower regularly. Attach bath mats securely with double-sided non-slip rug tape. Do not have throw rugs and other  things on the floor that can make you trip. What can I do in the bedroom? Use night lights. Make sure that you have a light by your bed that is easy to reach. Do not use any sheets or blankets that are too big for your bed. They should not hang down onto the floor. Have a firm chair that has side arms. You can use this for support while you get dressed. Do not have throw rugs and other things on the floor that can make you trip. What can I do in the kitchen? Clean up any spills right away. Avoid walking on wet floors. Keep items that you use a lot in easy-to-reach places. If you need to reach something above you, use a strong step stool that has a grab bar. Keep electrical cords out of the way. Do not use floor polish or wax that makes floors slippery. If you must use wax, use non-skid floor wax. Do not have throw rugs and other things on the floor that can make you trip. What can I do with my stairs? Do not leave any items on the stairs. Make sure that there are handrails on both sides of the stairs and use them. Fix handrails that are broken or loose. Make sure that handrails are as long as the stairways. Check any carpeting to make sure that it is firmly attached to the stairs. Fix any carpet that is loose or worn. Avoid  having throw rugs at the top or bottom of the stairs. If you do have throw rugs, attach them to the floor with carpet tape. Make sure that you have a light switch at the top of the stairs and the bottom of the stairs. If you do not have them, ask someone to add them for you. What else can I do to help prevent falls? Wear shoes that: Do not have high heels. Have rubber bottoms. Are comfortable and fit you well. Are closed at the toe. Do not wear sandals. If you use a stepladder: Make sure that it is fully opened. Do not climb a closed stepladder. Make sure that both sides of the stepladder are locked into place. Ask someone to hold it for you, if possible. Clearly  mark and make sure that you can see: Any grab bars or handrails. First and last steps. Where the edge of each step is. Use tools that help you move around (mobility aids) if they are needed. These include: Canes. Walkers. Scooters. Crutches. Turn on the lights when you go into a dark area. Replace any light bulbs as soon as they burn out. Set up your furniture so you have a clear path. Avoid moving your furniture around. If any of your floors are uneven, fix them. If there are any pets around you, be aware of where they are. Review your medicines with your doctor. Some medicines can make you feel dizzy. This can increase your chance of falling. Ask your doctor what other things that you can do to help prevent falls. This information is not intended to replace advice given to you by your health care provider. Make sure you discuss any questions you have with your health care provider. Document Released: 09/09/2009 Document Revised: 04/20/2016 Document Reviewed: 12/18/2014 Elsevier Interactive Patient Education  2017 [employer].

## 2022-04-05 DIAGNOSIS — I10 Essential (primary) hypertension: Secondary | ICD-10-CM | POA: Diagnosis not present

## 2022-04-05 DIAGNOSIS — N1832 Chronic kidney disease, stage 3b: Secondary | ICD-10-CM | POA: Diagnosis not present

## 2022-07-03 ENCOUNTER — Encounter: Payer: Self-pay | Admitting: Family Medicine

## 2022-07-03 ENCOUNTER — Ambulatory Visit (INDEPENDENT_AMBULATORY_CARE_PROVIDER_SITE_OTHER): Payer: Medicare HMO | Admitting: Family Medicine

## 2022-07-03 VITALS — BP 110/70 | HR 80 | Ht 72.0 in | Wt 250.0 lb

## 2022-07-03 DIAGNOSIS — R739 Hyperglycemia, unspecified: Secondary | ICD-10-CM | POA: Diagnosis not present

## 2022-07-03 DIAGNOSIS — J301 Allergic rhinitis due to pollen: Secondary | ICD-10-CM | POA: Diagnosis not present

## 2022-07-03 DIAGNOSIS — I10 Essential (primary) hypertension: Secondary | ICD-10-CM

## 2022-07-03 DIAGNOSIS — R69 Illness, unspecified: Secondary | ICD-10-CM | POA: Diagnosis not present

## 2022-07-03 DIAGNOSIS — E785 Hyperlipidemia, unspecified: Secondary | ICD-10-CM

## 2022-07-03 DIAGNOSIS — F3341 Major depressive disorder, recurrent, in partial remission: Secondary | ICD-10-CM | POA: Diagnosis not present

## 2022-07-03 MED ORDER — CARVEDILOL 6.25 MG PO TABS: ORAL_TABLET | ORAL | 1 refills | Status: DC

## 2022-07-03 MED ORDER — FLUTICASONE PROPIONATE 50 MCG/ACT NA SUSP: 1.0000 | Freq: Every day | NASAL | 5 refills | Status: DC | PRN

## 2022-07-03 MED ORDER — HYDROCHLOROTHIAZIDE 12.5 MG PO CAPS: ORAL_CAPSULE | ORAL | 1 refills | Status: DC

## 2022-07-03 MED ORDER — FENOFIBRATE 145 MG PO TABS: ORAL_TABLET | ORAL | 1 refills | Status: DC

## 2022-07-03 MED ORDER — AMLODIPINE BESY-BENAZEPRIL HCL 10-20 MG PO CAPS: ORAL_CAPSULE | ORAL | 1 refills | Status: DC

## 2022-07-03 MED ORDER — SERTRALINE HCL 50 MG PO TABS
50.0000 mg | ORAL_TABLET | Freq: Every day | ORAL | 1 refills | Status: DC
Start: 2022-07-03 — End: 2023-01-03

## 2022-07-03 MED ORDER — LORATADINE 10 MG PO TABS: ORAL_TABLET | ORAL | 1 refills | Status: DC

## 2022-07-03 MED ORDER — ATORVASTATIN CALCIUM 20 MG PO TABS: ORAL_TABLET | ORAL | 1 refills | Status: DC

## 2022-07-03 NOTE — Progress Notes (Signed)
Date:  07/03/2022   Name:  Gary Kramer   DOB:  14-Sep-1946   MRN:  [id number]   Chief Complaint: Hypertension, Hyperlipidemia, Depression, and Allergic Rhinitis   Hypertension This is a chronic problem. The current episode started more than 1 year ago. The problem has been gradually improving since onset. The problem is controlled. Pertinent negatives include no anxiety, blurred vision, chest pain, headaches, neck pain, orthopnea, palpitations, peripheral edema or shortness of breath. Risk factors for coronary artery disease include dyslipidemia. Past treatments include ACE inhibitors, calcium channel blockers, beta blockers, alpha 1 blockers and diuretics. The current treatment provides mild improvement. There are no compliance problems.  There is no history of angina, kidney disease, CAD/MI, CVA, heart failure, left ventricular hypertrophy, PVD or retinopathy. There is no history of chronic renal disease, a hypertension causing med or renovascular disease.  Hyperlipidemia This is a chronic problem. The current episode started more than 1 year ago. The problem is controlled. Recent lipid tests were reviewed and are normal. He has no history of chronic renal disease or diabetes. Factors aggravating his hyperlipidemia include thiazides. Pertinent negatives include no chest pain, focal sensory loss, focal weakness, leg pain, myalgias or shortness of breath. Current antihyperlipidemic treatment includes statins. The current treatment provides moderate improvement of lipids. There are no compliance problems.  Risk factors for coronary artery disease include diabetes mellitus, dyslipidemia and hypertension.  Depression        This is a chronic problem.  The current episode started more than 1 month ago.   The onset quality is gradual.   The problem occurs intermittently.  The problem has been gradually improving since onset.  Associated symptoms include no decreased concentration, no fatigue, no  helplessness, no hopelessness, does not have insomnia, not irritable, no restlessness, no decreased interest, no appetite change, no body aches, no myalgias, no headaches, no indigestion, not sad and no suicidal ideas.     The symptoms are aggravated by nothing.  Past treatments include SSRIs - Selective serotonin reuptake inhibitors.  Compliance with treatment is good.  Previous treatment provided moderate relief.   Pertinent negatives include no anxiety.   Lab Results  Component Value Date   NA 141 06/30/2021   K 4.6 06/30/2021   CO2 24 06/30/2021   GLUCOSE 162 (H) 06/30/2021   BUN 18 06/30/2021   CREATININE 1.46 (H) 06/30/2021   CALCIUM 9.8 06/30/2021   EGFR 50 (L) 06/30/2021   GFRNONAA 52 (L) 01/06/2021   Lab Results  Component Value Date   CHOL 171 06/30/2021   HDL 35 (L) 06/30/2021   LDLCALC 99 06/30/2021   TRIG 212 (H) 06/30/2021   CHOLHDL 4.1 07/16/2018   No results found for: "TSH" No results found for: "HGBA1C" Lab Results  Component Value Date   WBC 11.3 (H) 04/04/2018   HGB 9.7 (L) 04/04/2018   HCT 28.5 (L) 04/04/2018   MCV 85.9 04/04/2018   PLT 264 04/04/2018   Lab Results  Component Value Date   ALT 16 06/30/2021   AST 20 06/30/2021   ALKPHOS 97 06/30/2021   BILITOT 0.5 06/30/2021   No results found for: "25OHVITD2", "25OHVITD3", "VD25OH"   Review of Systems  Constitutional:  Negative for appetite change and fatigue.  HENT:  Negative for sinus pressure.   Eyes:  Negative for blurred vision.  Respiratory:  Negative for shortness of breath and wheezing.   Cardiovascular:  Negative for chest pain, palpitations and orthopnea.  Endocrine: Negative for polydipsia  and polyuria.  Genitourinary:  Negative for dysuria.  Musculoskeletal:  Negative for myalgias and neck pain.  Neurological:  Negative for focal weakness and headaches.  Psychiatric/Behavioral:  Positive for depression. Negative for decreased concentration and suicidal ideas. The patient does not  have insomnia.     Patient Active Problem List   Diagnosis Date Noted   Stage 3 chronic kidney disease ([place]) 09/02/2019   Obesity (BMI 30.0-34.9) 06/06/2018   Status post total hip replacement, right 04/02/2018   Presence of left artificial knee joint 01/24/2018   Recurrent major depressive disorder, in partial remission ([place]) 01/16/2018   Primary osteoarthritis of left knee 12/26/2017   Chronic allergic rhinitis 01/05/2017   Essential hypertension 07/20/2015   Hyperlipidemia 07/20/2015   Erectile dysfunction 07/20/2015   Depression 07/20/2015   Allergic rhinitis due to pollen 07/20/2015   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     Past Surgical History:  Procedure Laterality Date   COLONOSCOPY  2016   cleared for 5 yrs- Dr Candace Cruise   COLONOSCOPY WITH PROPOFOL N/A 03/10/2020   Procedure: COLONOSCOPY WITH PROPOFOL;  Surgeon: Jonathon Bellows, MD;  Location: [facility] ENDOSCOPY;  Service: Gastroenterology;  Laterality: N/A;   HERNIA REPAIR     JOINT REPLACEMENT Left 12/2017   TKR   JOINT REPLACEMENT Right 03/2018   Hip   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   Procedure: TOTAL HIP ARTHROPLASTY;  Surgeon: Corky Mull, MD;  Location: [facility] ORS;  Service: Orthopedics;  Laterality: Right;    Social History   Tobacco Use   Smoking status: Former    Packs/day: 1.50    Years: 30.00    Total pack years: 45.00    Types: Cigarettes    Quit date: 1996    Years  since quitting: 27.6   Smokeless tobacco: Never   Tobacco comments:    smoking cessatioinn materials not required  Vaping Use   Vaping Use: Never used  Substance Use Topics   Alcohol use: Yes    Alcohol/week: 0.0 standard drinks of alcohol    Comment: only occasionally   Drug use: No     Medication list has been reviewed and updated.  Current Meds  Medication Sig   amLODipine-benazepril (LOTREL) 10-20 MG capsule Take 1 capsule by mouth daily   Ascorbic Acid (VITAMIN C) 100 MG tablet Take 1,000 mg by mouth daily.    aspirin EC 81 MG tablet Take 81 mg by mouth daily.    atorvastatin (LIPITOR) 20 MG tablet One a day   carvedilol (COREG) 6.25 MG tablet Take 1 tablet by mouth twice daily   cholecalciferol (VITAMIN D) 400 units TABS tablet Take 1,000 Units by mouth daily.    diclofenac Sodium (VOLTAREN) 1 % GEL Apply topically. PRN   fenofibrate (TRICOR) 145 MG tablet TAKE ONE TABLET BY MOUTH AT 6 IN THE MORNING   fluticasone (FLONASE) 50 MCG/ACT nasal spray Place 1 spray into both nostrils daily as needed (for sinus/allergy issues.).  Garlic 370 MG CAPS Take 1,000 mg by mouth daily.    hydrochlorothiazide (MICROZIDE) 12.5 MG capsule TAKE (1) CAPSULE BY MOUTH EVERY DAY AT [facility] THE MORNING   loratadine (ALLERGY RELIEF) 10 MG tablet TAKE (1) TABLET BY MOUTH EVERY DAY   Multiple Vitamin (MULTI-VITAMINS) TABS Take 1 tablet by mouth daily at 6 (six) AM.   Omega-3 Fatty Acids (FISH OIL MAXIMUM STRENGTH) 1200 MG CPDR    sertraline (ZOLOFT) 50 MG tablet Take 1 tablet (50 mg total) by mouth daily at 6 (six) AM.       07/03/2022   10:49 AM 01/02/2022   10:43 AM 06/30/2021    3:01 PM 01/06/2021    1:28 PM  GAD 7 : Generalized Anxiety Score  Nervous, Anxious, on Edge 0 0 0 0  Control/stop worrying 0 0 0 0  Worry too much - different things 0 0 0 0  Trouble relaxing 0 0 0 0  Restless 0 0 0 0  Easily annoyed or irritable 0 0 0 0  Afraid - awful might happen 0 0 0 0  Total GAD 7 Score 0 0 0 0   Anxiety Difficulty Not difficult at all Not difficult at all         07/03/2022   10:49 AM 01/16/2022    2:51 PM 01/02/2022   10:43 AM  Depression screen PHQ 2/9  Decreased Interest 0 0 0  Down, Depressed, Hopeless 0 0 0  PHQ - 2 Score 0 0 0  Altered sleeping 0 0 0  Tired, decreased energy 0 0 0  Change in appetite 0 0 0  Feeling bad or failure about yourself  0 0 0  Trouble concentrating 0 0 0  Moving slowly or fidgety/restless 0 0 0  Suicidal thoughts 0 0 0  PHQ-9 Score 0 0 0  Difficult doing work/chores Not difficult at all Not difficult at all Not difficult at all    BP Readings from Last 3 Encounters:  07/03/22 110/70  01/02/22 132/62  06/30/21 130/60    Physical Exam Vitals and nursing note reviewed.  Constitutional:      General: He is not irritable. HENT:     Head: Normocephalic.     Right Ear: Tympanic membrane, ear canal and external ear normal. There is no impacted cerumen.     Left Ear: Tympanic membrane, ear canal and external ear normal. There is no impacted cerumen.     Nose: Nose normal. No congestion or rhinorrhea.     Mouth/Throat:     Mouth: Mucous membranes are moist.  Eyes:     General: No scleral icterus.       Right eye: No discharge.        Left eye: No discharge.     Conjunctiva/sclera: Conjunctivae normal.     Pupils: Pupils are equal, round, and reactive to light.  Neck:     Thyroid: No thyromegaly.     Vascular: No JVD.     Trachea: No tracheal deviation.  Cardiovascular:     Rate and Rhythm: Normal rate and regular rhythm.     Heart sounds: Normal heart sounds. No murmur heard.    No friction rub. No gallop.  Pulmonary:     Effort: No respiratory distress.     Breath sounds: Normal breath sounds. No wheezing, rhonchi or rales.  Abdominal:     General: Bowel sounds are normal.     Palpations: Abdomen is soft. There is no mass.  Tenderness: There is no abdominal tenderness. There is no guarding or rebound.  Musculoskeletal:         General: No tenderness. Normal range of motion.     Cervical back: Normal range of motion and neck supple.  Lymphadenopathy:     Cervical: No cervical adenopathy.  Skin:    General: Skin is warm.     Findings: No erythema or rash.  Neurological:     Mental Status: He is alert.     Cranial Nerves: No cranial nerve deficit.     Wt Readings from Last 3 Encounters:  07/03/22 250 lb (113.4 kg)  01/02/22 247 lb (112 kg)  06/30/21 244 lb (110.7 kg)    BP 110/70   Pulse 80   Ht 6' (1.829 m)   Wt 250 lb (113.4 kg)   BMI 33.91 kg/m   Assessment and Plan:  1. Essential hypertension Chronic.  Controlled.  Stable.  Blood pressure 110.  Amlodipine benazepril 10-milligrams a day, carvedilol 251, and hydrochlorothiazide 12.5 mg once a day.  Will check CMP for electrolytes and GFR. - amLODipine-benazepril (LOTREL) 10-20 MG capsule; Take 1 capsule by mouth daily  Dispense: 90 capsule; Refill: 1 - carvedilol (COREG) 6.25 MG tablet; Take 1 tablet by mouth twice daily  Dispense: 180 tablet; Refill: 1 - hydrochlorothiazide (MICROZIDE) 12.5 MG capsule; TAKE (1) CAPSULE BY MOUTH EVERY DAY AT [facility] THE MORNING  Dispense: 90 capsule; Refill: 1 - Comprehensive Metabolic Panel (CMET)  2. Hyperlipidemia, unspecified hyperlipidemia type Chronic controlled.  Stable.  Atorvastatin 20 mg once a day and fenofibrate 145 mg 1 every morning.  Will check lipid panel for lipid management. - atorvastatin (LIPITOR) 20 MG tablet; One a day  Dispense: 90 tablet; Refill: 1 - fenofibrate (TRICOR) 145 MG tablet; TAKE ONE TABLET BY MOUTH AT 6 IN THE MORNING  Dispense: 90 tablet; Refill: 1 - Lipid Panel With LDL/HDL Ratio  3. Seasonal allergic rhinitis due to pollen Chronic.  Controlled.  Stable.  Continue combination of Flonase nasal spray 1 spray each nostril once a day and loratadine 20 mg once a day. - fluticasone (FLONASE) 50 MCG/ACT nasal spray; Place 1 spray into both nostrils daily as needed (for sinus/allergy  issues.).  Dispense: 16 g; Refill: 5 - loratadine (ALLERGY RELIEF) 10 MG tablet; TAKE (1) TABLET BY MOUTH EVERY DAY  Dispense: 90 tablet; Refill: 1  4. Recurrent major depressive disorder, in partial remission (HCC) Chronic.  Controlled.  Stable.  Continue sertraline 50 mg once a day. - sertraline (ZOLOFT) 50 MG tablet; Take 1 tablet (50 mg total) by mouth daily at 6 (six) AM.  Dispense: 90 tablet; Refill: 1  5. Hyperglycemia As noted there have been elevated glucoses in the past but these have not been fasting however given that the last couple of been in the 140s to 160 range we will check A1c for the possibility of diabetic development. - HgB A1c    Otilio Miu, MD

## 2022-07-04 ENCOUNTER — Other Ambulatory Visit: Payer: Self-pay

## 2022-07-04 DIAGNOSIS — E119 Type 2 diabetes mellitus without complications: Secondary | ICD-10-CM

## 2022-07-04 LAB — COMPREHENSIVE METABOLIC PANEL
ALT: 18 IU/L (ref 0–44)
AST: 18 IU/L (ref 0–40)
Albumin/Globulin Ratio: 2.3 — ABNORMAL HIGH (ref 1.2–2.2)
Albumin: 4.1 g/dL (ref 3.8–4.8)
Alkaline Phosphatase: 108 IU/L (ref 44–121)
BUN/Creatinine Ratio: 14 (ref 10–24)
BUN: 20 mg/dL (ref 8–27)
Bilirubin Total: 0.2 mg/dL (ref 0.0–1.2)
CO2: 24 mmol/L (ref 20–29)
Calcium: 9.7 mg/dL (ref 8.6–10.2)
Chloride: 99 mmol/L (ref 96–106)
Creatinine, Ser: 1.38 mg/dL — ABNORMAL HIGH (ref 0.76–1.27)
Globulin, Total: 1.8 g/dL (ref 1.5–4.5)
Glucose: 285 mg/dL — ABNORMAL HIGH (ref 70–99)
Potassium: 4.1 mmol/L (ref 3.5–5.2)
Sodium: 138 mmol/L (ref 134–144)
Total Protein: 5.9 g/dL — ABNORMAL LOW (ref 6.0–8.5)
eGFR: 54 mL/min/{1.73_m2} — ABNORMAL LOW (ref >59)

## 2022-07-04 LAB — LIPID PANEL WITH LDL/HDL RATIO
Cholesterol, Total: 149 mg/dL (ref 100–199)
HDL: 30 mg/dL — ABNORMAL LOW (ref >39)
LDL Chol Calc (NIH): 74 mg/dL (ref 0–99)
LDL/HDL Ratio: 2.5 ratio (ref 0.0–3.6)
Triglycerides: 273 mg/dL — ABNORMAL HIGH (ref 0–149)
VLDL Cholesterol Cal: 45 mg/dL — ABNORMAL HIGH (ref 5–40)

## 2022-07-04 LAB — HEMOGLOBIN A1C
Est. average glucose Bld gHb Est-mCnc: 229 mg/dL
Hgb A1c MFr Bld: 9.6 % — ABNORMAL HIGH (ref 4.8–5.6)

## 2022-07-04 MED ORDER — METFORMIN HCL 500 MG PO TABS: 500.0000 mg | ORAL_TABLET | Freq: Two times a day (BID) | ORAL | 0 refills | Status: DC

## 2022-07-04 MED ORDER — GLIPIZIDE ER 2.5 MG PO TB24: 2.5000 mg | ORAL_TABLET | Freq: Every day | ORAL | 0 refills | Status: DC

## 2022-07-04 MED ORDER — BLOOD GLUCOSE MONITOR KIT: PACK | 0 refills | Status: DC

## 2022-07-04 NOTE — Progress Notes (Signed)
Sent in diab meds, glucometer, strips and lancets

## 2022-08-07 ENCOUNTER — Ambulatory Visit (INDEPENDENT_AMBULATORY_CARE_PROVIDER_SITE_OTHER): Payer: Medicare HMO

## 2022-08-07 DIAGNOSIS — Z23 Encounter for immunization: Secondary | ICD-10-CM

## 2022-09-11 ENCOUNTER — Telehealth: Payer: Self-pay | Admitting: *Deleted

## 2022-09-11 NOTE — Telephone Encounter (Signed)
medication was not found for this request. You can use Link Medication to select the medication to use when this request is sent for approval, or refuse it with the Refuse option.   Patient Information  Patient Name Sex DOB SSN Address Phone  [place], [place] M 08-12-1946 Not Available [address] [place] [address] [phone] [facility])  Order Details  Medication [facility] Prescription # DAW  ONETOUCH DELICA PLUS [id number] No  Written Date Last Fill Date Quantity Days Supply  07/04/2022 07/04/2022 100 each 30  Sig Notes  USE UP TO 4 TIMES A DAY AS DIRECTED. Not Available   Provider Information  Prescribing Provider License DEA #  Juline Patch Not Available [id number]   Pharmacy Information  Pharmacy NCPDP ID Address Phone  [facility] [place] [id number]  [place] [address] [phone] [facility])   This returned to pharm, I can not request it because that brand is not on current med list. The order was for whatever his insurance covered so I do not have a way to attach it with this brand of supplies.

## 2022-09-12 ENCOUNTER — Other Ambulatory Visit: Payer: Self-pay | Admitting: Family Medicine

## 2022-09-12 ENCOUNTER — Other Ambulatory Visit: Payer: Self-pay

## 2022-09-12 DIAGNOSIS — E119 Type 2 diabetes mellitus without complications: Secondary | ICD-10-CM

## 2022-09-12 MED ORDER — BLOOD GLUCOSE MONITOR KIT: PACK | 0 refills | Status: AC

## 2022-09-12 MED ORDER — ONETOUCH ULTRA VI STRP
ORAL_STRIP | 2 refills | Status: AC
Start: 2022-09-12 — End: ?

## 2022-09-12 NOTE — Telephone Encounter (Signed)
Unable to refill per protocol, last refill by provider 09/12/22 .  -Prescribing Status: Receipt confirmed by pharmacy (09/12/2022 11:38 AM EDT)    Will refuse.  Requested Prescriptions  Pending Prescriptions Disp Refills  . Blood Glucose Monitoring Suppl (ONE TOUCH ULTRA 2) w/Device KIT [Pharmacy Med Name: ONETOUCH ULTRA 2 W/DEVICE KIT]      Sig: USE AS DIRECTED     Endocrinology: Diabetes - Testing Supplies Passed - 09/12/2022  8:37 AM      Passed - Valid encounter within last 12 months    Recent Outpatient Visits          2 months ago Essential hypertension   [place] [facility] [place], Deanna C, MD   8 months ago Essential hypertension   [place] [facility] [place], Deanna C, MD   1 year ago Essential hypertension   [place] [facility] at [place], Deanna C, MD   1 year ago Essential hypertension   [place] [facility] [place], Deanna C, MD   2 years ago Essential hypertension   [place] [facility] [place], [place], MD      Future Appointments            In 3 weeks Juline Patch, MD [facility] Medicine at [facility], [place]   In 3 months Juline Patch, MD [facility] Primary Care and [facility] Medicine at [facility], [facility]

## 2022-09-12 NOTE — Telephone Encounter (Signed)
Copied from [place] [id number]. Topic: General - Other >> Sep 12, 2022 11:18 AM Everette C wrote: Reason for CRM: Medication Refill - Medication: blood glucose meter kit and supplies KIT [[id number]]   Has the patient contacted their pharmacy? Yes.  The patient has been directed to contact their PCP  (Agent: If no, request that the patient contact the pharmacy for the refill. If patient does not wish to contact the pharmacy document the reason why and proceed with request.) (Agent: If yes, when and what did the pharmacy advise?)  Preferred Pharmacy (with phone number or street name): [facility] [place] [facility] [place] [address] Phone: [phone] Fax: [phone] Hours: Not open 24 hours   Has the patient been seen for an appointment in the last year OR does the patient have an upcoming appointment? Yes.    Agent: Please be advised that RX refills may take up to 3 business days. We ask that you follow-up with your pharmacy.

## 2022-09-12 NOTE — Telephone Encounter (Signed)
Requested Prescriptions  Pending Prescriptions Disp Refills  . blood glucose meter kit and supplies KIT 1 each 0    Sig: Dispense based on patient and insurance preference. Use up to four times daily as directed.     Endocrinology: Diabetes - Testing Supplies Passed - 09/12/2022 11:52 AM      Passed - Valid encounter within last 12 months    Recent Outpatient Visits          2 months ago Essential hypertension   [place] [facility] [place], Deanna C, MD   8 months ago Essential hypertension   [place] [facility] [place], Deanna C, MD   1 year ago Essential hypertension   [place] [facility] [place], Deanna C, MD   1 year ago Essential hypertension   [place] [facility] [place], Deanna C, MD   2 years ago Essential hypertension   [place] [facility] [place], [place], MD      Future Appointments            In 3 weeks Juline Patch, MD [facility] Primary Care and Sports Medicine at [facility], [place]   In 3 months Juline Patch, MD [facility] Primary Care and Sports Medicine at [facility], [facility]

## 2022-09-27 ENCOUNTER — Ambulatory Visit (INDEPENDENT_AMBULATORY_CARE_PROVIDER_SITE_OTHER): Payer: Medicare HMO | Admitting: Family Medicine

## 2022-09-27 ENCOUNTER — Encounter: Payer: Self-pay | Admitting: Family Medicine

## 2022-09-27 VITALS — BP 122/78 | HR 77 | Ht 73.0 in | Wt 237.0 lb

## 2022-09-27 DIAGNOSIS — J0100 Acute maxillary sinusitis, unspecified: Secondary | ICD-10-CM

## 2022-09-27 MED ORDER — DOXYCYCLINE HYCLATE 100 MG PO TABS: 100.0000 mg | ORAL_TABLET | Freq: Two times a day (BID) | ORAL | 0 refills | Status: DC

## 2022-09-27 NOTE — Progress Notes (Signed)
Date:  09/27/2022   Name:  Gary Kramer   DOB:  06/11/1946   MRN:  [id number]   Chief Complaint: Sinusitis (Eyes watery, sore, teeth hurting)  Sinusitis This is a new problem. The current episode started in the past 7 days. The problem has been gradually worsening since onset. There has been no fever. The pain is mild. Associated symptoms include congestion, coughing and sinus pressure. Pertinent negatives include no chills, diaphoresis, ear pain, headaches, neck pain, shortness of breath, sneezing, sore throat or swollen glands. The treatment provided mild relief.    Lab Results  Component Value Date   NA 138 07/03/2022   K 4.1 07/03/2022   CO2 24 07/03/2022   GLUCOSE 285 (H) 07/03/2022   BUN 20 07/03/2022   CREATININE 1.38 (H) 07/03/2022   CALCIUM 9.7 07/03/2022   EGFR 54 (L) 07/03/2022   GFRNONAA 52 (L) 01/06/2021   Lab Results  Component Value Date   CHOL 149 07/03/2022   HDL 30 (L) 07/03/2022   LDLCALC 74 07/03/2022   TRIG 273 (H) 07/03/2022   CHOLHDL 4.1 07/16/2018   No results found for: "TSH" Lab Results  Component Value Date   HGBA1C 9.6 (H) 07/03/2022   Lab Results  Component Value Date   WBC 11.3 (H) 04/04/2018   HGB 9.7 (L) 04/04/2018   HCT 28.5 (L) 04/04/2018   MCV 85.9 04/04/2018   PLT 264 04/04/2018   Lab Results  Component Value Date   ALT 18 07/03/2022   AST 18 07/03/2022   ALKPHOS 108 07/03/2022   BILITOT 0.2 07/03/2022   No results found for: "25OHVITD2", "25OHVITD3", "VD25OH"   Review of Systems  Constitutional:  Negative for chills, diaphoresis and fever.  HENT:  Positive for congestion, postnasal drip, rhinorrhea, sinus pressure and sinus pain. Negative for drooling, ear discharge, ear pain, sneezing and sore throat.   Respiratory:  Positive for cough. Negative for shortness of breath and wheezing.   Cardiovascular:  Negative for chest pain, palpitations and leg swelling.  Gastrointestinal:  Negative for nausea.  Genitourinary:   Negative for urgency.  Musculoskeletal:  Negative for back pain, myalgias and neck pain.  Skin:  Negative for rash.  Allergic/Immunologic: Negative for environmental allergies.  Neurological:  Negative for dizziness and headaches.  Hematological:  Does not bruise/bleed easily.  Psychiatric/Behavioral:  Negative for suicidal ideas. The patient is not nervous/anxious.     Patient Active Problem List   Diagnosis Date Noted   Stage 3 chronic kidney disease ([place]) 09/02/2019   Obesity (BMI 30.0-34.9) 06/06/2018   Status post total hip replacement, right 04/02/2018   Presence of left artificial knee joint 01/24/2018   Recurrent major depressive disorder, in partial remission ([place]) 01/16/2018   Primary osteoarthritis of left knee 12/26/2017   Chronic allergic rhinitis 01/05/2017   Essential hypertension 07/20/2015   Hyperlipidemia 07/20/2015   Erectile dysfunction 07/20/2015   Depression 07/20/2015   Allergic rhinitis due to pollen 07/20/2015   Degenerative joint disease of hand 07/20/2015    Allergies  Allergen Reactions   Cefaclor Itching   Penicillin V Potassium Rash    Has patient had a PCN reaction causing immediate rash, facial/tongue/throat swelling, SOB or lightheadedness with hypotension: No Has patient had a PCN reaction causing severe rash involving mucus membranes or skin necrosis: Unknown Has patient had a PCN reaction that required hospitalization: No Has patient had a PCN reaction occurring within the last 10 years: No If all of the above answers are "NO",  then may proceed with Cephalosporin use.     Past Surgical History:  Procedure Laterality Date   COLONOSCOPY  2016   cleared for 5 yrs- Dr Candace Cruise   COLONOSCOPY WITH PROPOFOL N/A 03/10/2020   Procedure: COLONOSCOPY WITH PROPOFOL;  Surgeon: Jonathon Bellows, MD;  Location: [facility] ENDOSCOPY;  Service: Gastroenterology;  Laterality: N/A;   HERNIA REPAIR     JOINT REPLACEMENT Left 12/2017   TKR   JOINT REPLACEMENT Right  03/2018   Hip   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   Procedure: TOTAL HIP ARTHROPLASTY;  Surgeon: Corky Mull, MD;  Location: [facility] ORS;  Service: Orthopedics;  Laterality: Right;    Social History   Tobacco Use   Smoking status: Former    Packs/day: 1.50    Years: 30.00    Total pack years: 45.00    Types: Cigarettes    Quit date: 1996    Years since quitting: 27.8   Smokeless tobacco: Never   Tobacco comments:    smoking cessatioinn materials not required  Vaping Use   Vaping Use: Never used  Substance Use Topics   Alcohol use: Yes    Alcohol/week: 0.0 standard drinks of alcohol    Comment: only occasionally   Drug use: No     Medication list has been reviewed and updated.  Current Meds  Medication Sig   amLODipine-benazepril (LOTREL) 10-20 MG capsule Take 1 capsule by mouth daily   Ascorbic Acid (VITAMIN C) 100 MG tablet Take 1,000 mg by mouth daily.    aspirin EC 81 MG tablet Take 81 mg by mouth daily.    atorvastatin (LIPITOR) 20 MG tablet One a day   blood glucose meter kit and supplies KIT Dispense based on patient and insurance preference. Use up to four times daily as directed.   carvedilol (COREG) 6.25 MG tablet Take 1 tablet by mouth twice daily   cholecalciferol (VITAMIN D) 400 units TABS tablet Take 1,000 Units by mouth daily.    diclofenac Sodium (VOLTAREN) 1 % GEL Apply topically. PRN   fenofibrate (TRICOR) 145 MG tablet TAKE ONE TABLET BY MOUTH AT 6 IN THE MORNING   fluticasone (FLONASE) 50 MCG/ACT nasal spray Place 1 spray into both nostrils daily as needed (for sinus/allergy issues.).   Garlic 643 MG CAPS Take 1,000 mg by mouth daily.    glipiZIDE (GLUCOTROL XL) 2.5 MG 24 hr tablet Take 1 tablet (2.5 mg total) by mouth daily with breakfast.   glucose blood (ONETOUCH ULTRA) test strip Use up to 4 times daily as directed   hydrochlorothiazide (MICROZIDE) 12.5 MG capsule TAKE (1) CAPSULE BY MOUTH EVERY DAY AT 3IR THE MORNING   loratadine (ALLERGY  RELIEF) 10 MG tablet TAKE (1) TABLET BY MOUTH EVERY DAY   metFORMIN (GLUCOPHAGE) 500 MG tablet Take 1 tablet (500 mg total) by mouth 2 (two) times daily with a meal.   Multiple Vitamin (MULTI-VITAMINS) TABS Take 1 tablet by mouth daily at 6 (six) AM.   Omega-3 Fatty Acids (FISH OIL MAXIMUM STRENGTH) 1200 MG CPDR    sertraline (ZOLOFT) 50 MG tablet Take 1 tablet (50 mg total) by mouth daily at 6 (six) AM.       09/27/2022   11:37 AM 07/03/2022   10:49 AM 01/02/2022   10:43 AM 06/30/2021    3:01 PM  GAD 7 : Generalized Anxiety Score  Nervous, Anxious, on Edge 0 0 0 0  Control/stop worrying 0 0 0 0  Worry too much - different things  0 0 0 0  Trouble relaxing 0 0 0 0  Restless 0 0 0 0  Easily annoyed or irritable 0 0 0 0  Afraid - awful might happen 0 0 0 0  Total GAD 7 Score 0 0 0 0  Anxiety Difficulty Not difficult at all Not difficult at all Not difficult at all        09/27/2022   11:37 AM 07/03/2022   10:49 AM 01/16/2022    2:51 PM  Depression screen PHQ 2/9  Decreased Interest 0 0 0  Down, Depressed, Hopeless 0 0 0  PHQ - 2 Score 0 0 0  Altered sleeping 0 0 0  Tired, decreased energy 0 0 0  Change in appetite 0 0 0  Feeling bad or failure about yourself  0 0 0  Trouble concentrating 0 0 0  Moving slowly or fidgety/restless 0 0 0  Suicidal thoughts 0 0 0  PHQ-9 Score 0 0 0  Difficult doing work/chores Not difficult at all Not difficult at all Not difficult at all    BP Readings from Last 3 Encounters:  09/27/22 122/78  07/03/22 110/70  01/02/22 132/62    Physical Exam Vitals and nursing note reviewed.  HENT:     Head: Normocephalic.     Right Ear: External ear normal.     Left Ear: External ear normal.     Nose: Nose normal.  Eyes:     General: No scleral icterus.       Right eye: No discharge.        Left eye: No discharge.     Conjunctiva/sclera: Conjunctivae normal.     Pupils: Pupils are equal, round, and reactive to light.  Neck:     Thyroid: No  thyromegaly.     Vascular: No JVD.     Trachea: No tracheal deviation.  Cardiovascular:     Rate and Rhythm: Normal rate and regular rhythm.     Heart sounds: Normal heart sounds. No murmur heard.    No friction rub. No gallop.  Pulmonary:     Effort: No respiratory distress.     Breath sounds: Normal breath sounds. No wheezing or rales.  Abdominal:     General: Bowel sounds are normal.     Palpations: Abdomen is soft. There is no mass.     Tenderness: There is no abdominal tenderness. There is no guarding or rebound.  Musculoskeletal:        General: No tenderness. Normal range of motion.     Cervical back: Normal range of motion and neck supple.  Lymphadenopathy:     Cervical: No cervical adenopathy.  Skin:    General: Skin is warm.     Findings: No rash.  Neurological:     General: No focal deficit present.     Mental Status: He is alert.     Wt Readings from Last 3 Encounters:  09/27/22 237 lb (107.5 kg)  07/03/22 250 lb (113.4 kg)  01/02/22 247 lb (112 kg)    BP 122/78   Pulse 77   Ht _0  (1.854 m)   Wt 237 lb (107.5 kg)   SpO2 97%   BMI 31.27 kg/m   Assessment and Plan:  1. Acute maxillary sinusitis, recurrence not specified Acute.  Persistent.  Stable.  History and physical exam is consistent with maxillary and frontal sinusitis with tenderness of these areas.  Patient is unable to tolerate penicillins due to allergy and we will treat with  doxycycline 100 mg twice a day for 10 days. - doxycycline (VIBRA-TABS) 100 MG tablet; Take 1 tablet (100 mg total) by mouth 2 (two) times daily.  Dispense: 20 tablet; Refill: 0    Otilio Miu, MD

## 2022-09-28 ENCOUNTER — Other Ambulatory Visit: Payer: Self-pay | Admitting: Family Medicine

## 2022-09-28 DIAGNOSIS — E119 Type 2 diabetes mellitus without complications: Secondary | ICD-10-CM

## 2022-09-28 NOTE — Telephone Encounter (Signed)
Requested Prescriptions  Pending Prescriptions Disp Refills   glipiZIDE (GLUCOTROL XL) 2.5 MG 24 hr tablet [Pharmacy Med Name: GLIPIZIDE ER 2.5 MG TAB] 90 tablet 1    Sig: TAKE 1 TABLET BY MOUTH DAILY WITH BREAKFAST.     Endocrinology:  Diabetes - Sulfonylureas Failed - 09/28/2022  2:48 PM      Failed - HBA1C is between 0 and 7.9 and within 180 days    Hgb A1c MFr Bld  Date Value Ref Range Status  07/03/2022 9.6 (H) 4.8 - 5.6 % Final    Comment:             Prediabetes: 5.7 - 6.4          Diabetes: >6.4          Glycemic control for adults with diabetes: <7.0          Failed - Cr in normal range and within 360 days    Creatinine, Ser  Date Value Ref Range Status  07/03/2022 1.38 (H) 0.76 - 1.27 mg/dL Final         Passed - Valid encounter within last 6 months    Recent Outpatient Visits           Yesterday Acute maxillary sinusitis, recurrence not specified   [place] [facility] [place], [place], MD   2 months ago Essential hypertension   [place] [facility] [place], Deanna C, MD   8 months ago Essential hypertension   [place] [facility] [place], Deanna C, MD   1 year ago Essential hypertension   [place] [facility] [place], Deanna C, MD   1 year ago Essential hypertension   [place] [facility] [place], [place], MD       Future Appointments             In 6 days Juline Patch, MD [facility] at [facility], [place]   In 3 months Juline Patch, MD [facility] and [facility] at [facility], [facility]             metFORMIN (GLUCOPHAGE) 500 MG tablet [Pharmacy Med Name: METFORMIN HCL 500 MG TAB] 180 tablet 1    Sig: TAKE (1) TABLET BY MOUTH TWICE DAILY WITH A MEAL.     Endocrinology:  Diabetes -  Biguanides Failed - 09/28/2022  2:48 PM      Failed - Cr in normal range and within 360 days    Creatinine, Ser  Date Value Ref Range Status  07/03/2022 1.38 (H) 0.76 - 1.27 mg/dL Final         Failed - HBA1C is between 0 and 7.9 and within 180 days    Hgb A1c MFr Bld  Date Value Ref Range Status  07/03/2022 9.6 (H) 4.8 - 5.6 % Final    Comment:             Prediabetes: 5.7 - 6.4          Diabetes: >6.4          Glycemic control for adults with diabetes: <7.0          Failed - eGFR in normal range and within 360 days    GFR calc Af [place]nia Lora  Date Value Ref Range Status  01/06/2021 60 >59 mL/min/1.73 Final    Comment:    **In accordance with recommendations from the NKF-ASN Task force,**   [facility] is in the process of updating its eGFR calculation to the   2021 CKD-EPI creatinine equation that estimates kidney function   without a race variable.    GFR calc non Af Amer  Date Value Ref Range Status  01/06/2021 52 (L) >59 mL/min/1.73 Final   eGFR  Date Value Ref Range Status  07/03/2022 54 (L) >59 mL/min/1.73 Final         Failed - B12 Level in normal range and within 720 days    No results found for: "VITAMINB12"       Failed - CBC within normal limits and completed in the last 12 months    WBC  Date Value Ref Range Status  04/04/2018 11.3 (H) 3.8 - 10.6 K/uL Final   RBC  Date Value Ref Range Status  04/04/2018 3.32 (L) 4.40 - 5.90 MIL/uL Final   Hemoglobin  Date Value Ref Range Status  04/04/2018 9.7 (L) 13.0 - 18.0 g/dL Final  12/27/2017 13.6 13.0 - 17.7 g/dL Final   HCT  Date Value Ref Range Status  04/04/2018 28.5 (L) 40.0 - 52.0 % Final   MCHC  Date Value Ref Range Status  04/04/2018 34.1 32.0 - 36.0 g/dL Final   MCH  Date Value Ref Range Status  04/04/2018 29.3 26.0 - 34.0 pg Final   MCV  Date Value Ref Range Status  04/04/2018 85.9 80.0 - 100.0 fL Final   No results found for: "PLTCOUNTKUC", "LABPLAT", "POCPLA" RDW  Date Value Ref Range Status   04/04/2018 14.2 11.5 - 14.5 % Final         Passed - Valid encounter within last 6 months    Recent Outpatient Visits           Yesterday Acute maxillary sinusitis, recurrence not specified   [place] [facility] [place], [place], MD   2 months ago Essential hypertension   [place] [facility] [place], Deanna C, MD   8 months ago Essential hypertension   [place] [facility] [place], Deanna C, MD   1 year ago Essential hypertension   [place] [facility] [place], Deanna C, MD   1 year ago Essential hypertension   [place] [facility] [place], [place], MD       Future Appointments             In 6 days Juline Patch, MD [facility] Health Primary Care and Sports Medicine at [facility], [place]   In 3 months Juline Patch, MD [facility] Primary [facility] and [facility] Medicine at [facility], [facility]

## 2022-09-29 ENCOUNTER — Other Ambulatory Visit: Payer: Self-pay

## 2022-09-29 DIAGNOSIS — E119 Type 2 diabetes mellitus without complications: Secondary | ICD-10-CM

## 2022-09-29 MED ORDER — METFORMIN HCL 500 MG PO TABS: ORAL_TABLET | ORAL | 0 refills | Status: DC

## 2022-09-29 MED ORDER — GLIPIZIDE ER 2.5 MG PO TB24: 2.5000 mg | ORAL_TABLET | Freq: Every day | ORAL | 0 refills | Status: DC

## 2022-10-04 ENCOUNTER — Ambulatory Visit (INDEPENDENT_AMBULATORY_CARE_PROVIDER_SITE_OTHER): Payer: Medicare HMO | Admitting: Family Medicine

## 2022-10-04 ENCOUNTER — Encounter: Payer: Self-pay | Admitting: Family Medicine

## 2022-10-04 VITALS — BP 110/76 | HR 65 | Ht 73.0 in | Wt 238.0 lb

## 2022-10-04 DIAGNOSIS — E119 Type 2 diabetes mellitus without complications: Secondary | ICD-10-CM | POA: Diagnosis not present

## 2022-10-04 MED ORDER — METFORMIN HCL 500 MG PO TABS: ORAL_TABLET | ORAL | 0 refills | Status: DC

## 2022-10-04 MED ORDER — GLIPIZIDE ER 2.5 MG PO TB24: 2.5000 mg | ORAL_TABLET | Freq: Every day | ORAL | 0 refills | Status: DC

## 2022-10-04 NOTE — Progress Notes (Signed)
Date:  10/04/2022   Name:  Gary Kramer   DOB:  [birth year]   MRN:  [id number]   Chief Complaint: Diabetes  Diabetes He presents for his follow-up diabetic visit. He has type 2 diabetes mellitus. His disease course has been stable. There are no hypoglycemic associated symptoms. Pertinent negatives for hypoglycemia include no dizziness, headaches or nervousness/anxiousness. There are no diabetic associated symptoms. Pertinent negatives for diabetes include no chest pain and no polydipsia. There are no hypoglycemic complications. Symptoms are stable. There are no diabetic complications. Pertinent negatives for diabetic complications include no peripheral neuropathy. Current diabetic treatment includes oral agent (dual therapy). His weight is stable. He is following a generally healthy diet. Meal planning includes avoidance of concentrated sweets and carbohydrate counting. He participates in exercise daily. An ACE inhibitor/angiotensin II receptor blocker is being taken.    Lab Results  Component Value Date   NA 138 07/03/2022   K 4.1 07/03/2022   CO2 24 07/03/2022   GLUCOSE 285 (H) 07/03/2022   BUN 20 07/03/2022   CREATININE 1.38 (H) 07/03/2022   CALCIUM 9.7 07/03/2022   EGFR 54 (L) 07/03/2022   GFRNONAA 52 (L) 01/06/2021   Lab Results  Component Value Date   CHOL 149 07/03/2022   HDL 30 (L) 07/03/2022   LDLCALC 74 07/03/2022   TRIG 273 (H) 07/03/2022   CHOLHDL 4.1 07/16/2018   No results found for: "TSH" Lab Results  Component Value Date   HGBA1C 9.6 (H) 07/03/2022   Lab Results  Component Value Date   WBC 11.3 (H) 04/04/2018   HGB 9.7 (L) 04/04/2018   HCT 28.5 (L) 04/04/2018   MCV 85.9 04/04/2018   PLT 264 04/04/2018   Lab Results  Component Value Date   ALT 18 07/03/2022   AST 18 07/03/2022   ALKPHOS 108 07/03/2022   BILITOT 0.2 07/03/2022   No results found for: "25OHVITD2", "25OHVITD3", "VD25OH"   Review of Systems  Constitutional:  Negative for chills  and fever.  HENT:  Negative for drooling, ear discharge, ear pain and sore throat.   Respiratory:  Negative for cough, shortness of breath and wheezing.   Cardiovascular:  Negative for chest pain, palpitations and leg swelling.  Gastrointestinal:  Negative for abdominal pain, blood in stool, constipation, diarrhea and nausea.  Endocrine: Negative for polydipsia.  Genitourinary:  Negative for dysuria, frequency, hematuria and urgency.  Musculoskeletal:  Negative for back pain, myalgias and neck pain.  Skin:  Negative for rash.  Allergic/Immunologic: Negative for environmental allergies.  Neurological:  Negative for dizziness and headaches.  Hematological:  Does not bruise/bleed easily.  Psychiatric/Behavioral:  Negative for suicidal ideas. The patient is not nervous/anxious.     Patient Active Problem List   Diagnosis Date Noted   Stage 3 chronic kidney disease ([place]) 09/02/2019   Obesity (BMI 30.0-34.9) 06/06/2018   Status post total hip replacement, right 04/02/2018   Presence of left artificial knee joint 01/24/2018   Recurrent major depressive disorder, in partial remission ([place]) 01/16/2018   Primary osteoarthritis of left knee 12/26/2017   Chronic allergic rhinitis 01/05/2017   Essential hypertension 07/20/2015   Hyperlipidemia 07/20/2015   Erectile dysfunction 07/20/2015   Depression 07/20/2015   Allergic rhinitis due to pollen 07/20/2015   Degenerative joint disease of hand 07/20/2015    Allergies  Allergen Reactions   Cefaclor Itching   Penicillin V Potassium Rash    Has patient had a PCN reaction causing immediate rash, facial/tongue/throat swelling, SOB or lightheadedness  with hypotension: No Has patient had a PCN reaction causing severe rash involving mucus membranes or skin necrosis: Unknown Has patient had a PCN reaction that required hospitalization: No Has patient had a PCN reaction occurring within the last 10 years: No If all of the above answers are "NO", then  may proceed with Cephalosporin use.     Past Surgical History:  Procedure Laterality Date   COLONOSCOPY  2016   cleared for 5 yrs- Dr Candace Cruise   COLONOSCOPY WITH PROPOFOL N/A 03/10/2020   Procedure: COLONOSCOPY WITH PROPOFOL;  Surgeon: Jonathon Bellows, MD;  Location: [facility] ENDOSCOPY;  Service: Gastroenterology;  Laterality: N/A;   HERNIA REPAIR     JOINT REPLACEMENT Left 12/2017   TKR   JOINT REPLACEMENT Right 03/2018   Hip   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   Procedure: TOTAL HIP ARTHROPLASTY;  Surgeon: Corky Mull, MD;  Location: [facility] ORS;  Service: Orthopedics;  Laterality: Right;    Social History   Tobacco Use   Smoking status: Former    Packs/day: 1.50    Years: 30.00    Total pack years: 45.00    Types: Cigarettes    Quit date: 1996    Years since quitting: 27.8   Smokeless tobacco: Never   Tobacco comments:    smoking cessatioinn materials not required  Vaping Use   Vaping Use: Never used  Substance Use Topics   Alcohol use: Yes    Alcohol/week: 0.0 standard drinks of alcohol    Comment: only occasionally   Drug use: No     Medication list has been reviewed and updated.  Current Meds  Medication Sig   amLODipine-benazepril (LOTREL) 10-20 MG capsule Take 1 capsule by mouth daily   Ascorbic Acid (VITAMIN C) 100 MG tablet Take 1,000 mg by mouth daily.    aspirin EC 81 MG tablet Take 81 mg by mouth daily.    atorvastatin (LIPITOR) 20 MG tablet One a day   blood glucose meter kit and supplies KIT Dispense based on patient and insurance preference. Use up to four times daily as directed.   carvedilol (COREG) 6.25 MG tablet Take 1 tablet by mouth twice daily   cholecalciferol (VITAMIN D) 400 units TABS tablet Take 1,000 Units by mouth daily.    diclofenac Sodium (VOLTAREN) 1 % GEL Apply topically. PRN   doxycycline (VIBRA-TABS) 100 MG tablet Take 1 tablet (100 mg total) by mouth 2 (two) times daily.   fenofibrate (TRICOR) 145 MG tablet TAKE ONE TABLET BY MOUTH AT 6  IN THE MORNING   fluticasone (FLONASE) 50 MCG/ACT nasal spray Place 1 spray into both nostrils daily as needed (for sinus/allergy issues.).   Garlic 163 MG CAPS Take 1,000 mg by mouth daily.    glipiZIDE (GLUCOTROL XL) 2.5 MG 24 hr tablet Take 1 tablet (2.5 mg total) by mouth daily with breakfast.   glucose blood (ONETOUCH ULTRA) test strip Use up to 4 times daily as directed   hydrochlorothiazide (MICROZIDE) 12.5 MG capsule TAKE (1) CAPSULE BY MOUTH EVERY DAY AT 8GY THE MORNING   loratadine (ALLERGY RELIEF) 10 MG tablet TAKE (1) TABLET BY MOUTH EVERY DAY   metFORMIN (GLUCOPHAGE) 500 MG tablet TAKE (1) TABLET BY MOUTH TWICE DAILY WITH A MEAL.   Multiple Vitamin (MULTI-VITAMINS) TABS Take 1 tablet by mouth daily at 6 (six) AM.   Omega-3 Fatty Acids (FISH OIL MAXIMUM STRENGTH) 1200 MG CPDR    sertraline (ZOLOFT) 50 MG tablet Take 1 tablet (50 mg total)  by mouth daily at 6 (six) AM.       10/04/2022   10:20 AM 09/27/2022   11:37 AM 07/03/2022   10:49 AM 01/02/2022   10:43 AM  GAD 7 : Generalized Anxiety Score  Nervous, Anxious, on Edge 0 0 0 0  Control/stop worrying 0 0 0 0  Worry too much - different things 0 0 0 0  Trouble relaxing 0 0 0 0  Restless 0 0 0 0  Easily annoyed or irritable 0 0 0 0  Afraid - awful might happen 0 0 0 0  Total GAD 7 Score 0 0 0 0  Anxiety Difficulty Not difficult at all Not difficult at all Not difficult at all Not difficult at all       10/04/2022   10:20 AM 09/27/2022   11:37 AM 07/03/2022   10:49 AM  Depression screen PHQ 2/9  Decreased Interest 0 0 0  Down, Depressed, Hopeless 0 0 0  PHQ - 2 Score 0 0 0  Altered sleeping 0 0 0  Tired, decreased energy 0 0 0  Change in appetite 0 0 0  Feeling bad or failure about yourself  0 0 0  Trouble concentrating 0 0 0  Moving slowly or fidgety/restless 0 0 0  Suicidal thoughts 0 0 0  PHQ-9 Score 0 0 0  Difficult doing work/chores Not difficult at all Not difficult at all Not difficult at all    BP Readings  from Last 3 Encounters:  10/04/22 110/76  09/27/22 122/78  07/03/22 110/70    Physical Exam Vitals and nursing note reviewed.  HENT:     Head: Normocephalic.     Right Ear: Tympanic membrane, ear canal and external ear normal.     Left Ear: Tympanic membrane, ear canal and external ear normal.     Nose: Nose normal.  Eyes:     General: No scleral icterus.       Right eye: No discharge.        Left eye: No discharge.     Conjunctiva/sclera: Conjunctivae normal.     Pupils: Pupils are equal, round, and reactive to light.  Neck:     Thyroid: No thyromegaly.     Vascular: No JVD.     Trachea: No tracheal deviation.  Cardiovascular:     Rate and Rhythm: Normal rate and regular rhythm.     Heart sounds: Normal heart sounds. No murmur heard.    No friction rub. No gallop.  Pulmonary:     Effort: No respiratory distress.     Breath sounds: Normal breath sounds. No wheezing or rales.  Abdominal:     General: Bowel sounds are normal.     Palpations: Abdomen is soft. There is no mass.     Tenderness: There is no abdominal tenderness. There is no guarding or rebound.  Musculoskeletal:        General: No tenderness. Normal range of motion.     Cervical back: Normal range of motion and neck supple.  Lymphadenopathy:     Cervical: No cervical adenopathy.  Skin:    General: Skin is warm.     Findings: No rash.  Neurological:     Mental Status: He is alert.     Wt Readings from Last 3 Encounters:  10/04/22 238 lb (108 kg)  09/27/22 237 lb (107.5 kg)  07/03/22 250 lb (113.4 kg)    BP 110/76   Pulse 65   Ht _0  (1.854 m)  Wt 238 lb (108 kg)   SpO2 96%   BMI 31.40 kg/m   Assessment and Plan:  1. New onset type 2 diabetes mellitus ([place]) New onset.  Controlled.  Stable.  Fasting blood sugars accompanying patient Arrien in excellent range.  Anticipating A1c to be improved significantly.  We will obtain A1c with microalbuminuria.  We will hold on prescribing current dosings  of metformin and glipizide until A1c reading noted   Otilio Miu, MD

## 2022-10-06 LAB — HEMOGLOBIN A1C
Est. average glucose Bld gHb Est-mCnc: 128 mg/dL
Hgb A1c MFr Bld: 6.1 % — ABNORMAL HIGH (ref 4.8–5.6)

## 2022-10-06 LAB — MICROALBUMIN / CREATININE URINE RATIO
Creatinine, Urine: 153.9 mg/dL
Microalb/Creat Ratio: 7 mg/g creat (ref 0–29)
Microalbumin, Urine: 10.6 ug/mL

## 2022-10-16 DIAGNOSIS — Z7982 Long term (current) use of aspirin: Secondary | ICD-10-CM | POA: Diagnosis not present

## 2022-10-16 DIAGNOSIS — E1122 Type 2 diabetes mellitus with diabetic chronic kidney disease: Secondary | ICD-10-CM | POA: Diagnosis not present

## 2022-10-16 DIAGNOSIS — Z7984 Long term (current) use of oral hypoglycemic drugs: Secondary | ICD-10-CM | POA: Diagnosis not present

## 2022-10-16 DIAGNOSIS — R69 Illness, unspecified: Secondary | ICD-10-CM | POA: Diagnosis not present

## 2022-10-16 DIAGNOSIS — Z87891 Personal history of nicotine dependence: Secondary | ICD-10-CM | POA: Diagnosis not present

## 2022-10-16 DIAGNOSIS — Z96649 Presence of unspecified artificial hip joint: Secondary | ICD-10-CM | POA: Diagnosis not present

## 2022-10-16 DIAGNOSIS — Z008 Encounter for other general examination: Secondary | ICD-10-CM | POA: Diagnosis not present

## 2022-10-16 DIAGNOSIS — I129 Hypertensive chronic kidney disease with stage 1 through stage 4 chronic kidney disease, or unspecified chronic kidney disease: Secondary | ICD-10-CM | POA: Diagnosis not present

## 2022-10-16 DIAGNOSIS — J301 Allergic rhinitis due to pollen: Secondary | ICD-10-CM | POA: Diagnosis not present

## 2022-10-16 DIAGNOSIS — E785 Hyperlipidemia, unspecified: Secondary | ICD-10-CM | POA: Diagnosis not present

## 2022-10-16 DIAGNOSIS — Z880 Allergy status to penicillin: Secondary | ICD-10-CM | POA: Diagnosis not present

## 2022-10-16 DIAGNOSIS — N1831 Chronic kidney disease, stage 3a: Secondary | ICD-10-CM | POA: Diagnosis not present

## 2022-10-16 DIAGNOSIS — M1990 Unspecified osteoarthritis, unspecified site: Secondary | ICD-10-CM | POA: Diagnosis not present

## 2023-01-03 ENCOUNTER — Encounter: Payer: Self-pay | Admitting: Family Medicine

## 2023-01-03 ENCOUNTER — Ambulatory Visit (INDEPENDENT_AMBULATORY_CARE_PROVIDER_SITE_OTHER): Payer: Medicare HMO | Admitting: Family Medicine

## 2023-01-03 VITALS — BP 124/78 | HR 62 | Ht 73.0 in | Wt 234.0 lb

## 2023-01-03 DIAGNOSIS — E119 Type 2 diabetes mellitus without complications: Secondary | ICD-10-CM

## 2023-01-03 DIAGNOSIS — R69 Illness, unspecified: Secondary | ICD-10-CM | POA: Diagnosis not present

## 2023-01-03 DIAGNOSIS — I10 Essential (primary) hypertension: Secondary | ICD-10-CM | POA: Diagnosis not present

## 2023-01-03 DIAGNOSIS — R739 Hyperglycemia, unspecified: Secondary | ICD-10-CM

## 2023-01-03 DIAGNOSIS — F3341 Major depressive disorder, recurrent, in partial remission: Secondary | ICD-10-CM

## 2023-01-03 DIAGNOSIS — J301 Allergic rhinitis due to pollen: Secondary | ICD-10-CM | POA: Diagnosis not present

## 2023-01-03 DIAGNOSIS — E785 Hyperlipidemia, unspecified: Secondary | ICD-10-CM

## 2023-01-03 MED ORDER — GLIPIZIDE ER 2.5 MG PO TB24: 2.5000 mg | ORAL_TABLET | Freq: Every day | ORAL | 0 refills | Status: DC

## 2023-01-03 MED ORDER — FLUTICASONE PROPIONATE 50 MCG/ACT NA SUSP: 1.0000 | Freq: Every day | NASAL | 5 refills | Status: DC | PRN

## 2023-01-03 MED ORDER — HYDROCHLOROTHIAZIDE 12.5 MG PO CAPS: ORAL_CAPSULE | ORAL | 1 refills | Status: DC

## 2023-01-03 MED ORDER — CARVEDILOL 6.25 MG PO TABS: ORAL_TABLET | ORAL | 1 refills | Status: DC

## 2023-01-03 MED ORDER — ATORVASTATIN CALCIUM 20 MG PO TABS: ORAL_TABLET | ORAL | 1 refills | Status: DC

## 2023-01-03 MED ORDER — SERTRALINE HCL 50 MG PO TABS: 50.0000 mg | ORAL_TABLET | Freq: Every day | ORAL | 1 refills | Status: DC

## 2023-01-03 MED ORDER — FENOFIBRATE 145 MG PO TABS: ORAL_TABLET | ORAL | 1 refills | Status: DC

## 2023-01-03 MED ORDER — LORATADINE 10 MG PO TABS: ORAL_TABLET | ORAL | 1 refills | Status: DC

## 2023-01-03 MED ORDER — METFORMIN HCL 500 MG PO TABS: ORAL_TABLET | ORAL | 0 refills | Status: DC

## 2023-01-03 MED ORDER — AMLODIPINE BESY-BENAZEPRIL HCL 10-20 MG PO CAPS: ORAL_CAPSULE | ORAL | 1 refills | Status: DC

## 2023-01-03 NOTE — Progress Notes (Signed)
Date:  01/03/2023   Name:  Gary Kramer   DOB:  1946/02/21   MRN:  [id number]   Chief Complaint: Hypertension, Hyperlipidemia, Allergic Rhinitis , Depression, and prediabtes  Hypertension This is a chronic problem. The current episode started more than 1 year ago. The problem has been gradually improving since onset. The problem is controlled. Pertinent negatives include no anxiety, blurred vision, chest pain, headaches, malaise/fatigue, neck pain, orthopnea, palpitations, peripheral edema, PND, shortness of breath or sweats. There are no associated agents to hypertension. Risk factors for coronary artery disease include diabetes mellitus. Past treatments include calcium channel blockers, ACE inhibitors, beta blockers, angiotensin blockers and diuretics. The current treatment provides moderate improvement. There are no compliance problems.   Hyperlipidemia This is a chronic problem. The current episode started more than 1 year ago. The problem is controlled. Recent lipid tests were reviewed and are normal. Pertinent negatives include no chest pain, myalgias or shortness of breath. Current antihyperlipidemic treatment includes statins. The current treatment provides moderate improvement of lipids. There are no compliance problems.   Depression        This is a chronic problem.  The current episode started more than 1 year ago.   The onset quality is gradual.   The problem has been waxing and waning since onset.  Associated symptoms include no decreased concentration, no fatigue, no helplessness, no hopelessness, does not have insomnia, not irritable, no restlessness, no decreased interest, no appetite change, no body aches, no myalgias, no headaches, no indigestion, not sad and no suicidal ideas.     The symptoms are aggravated by nothing.  Past treatments include SSRIs - Selective serotonin reuptake inhibitors.  Compliance with treatment is good.  Previous treatment provided moderate relief.    Pertinent negatives include no anxiety. Diabetes He presents for his follow-up diabetic visit. He has type 2 diabetes mellitus. His disease course has been stable. Pertinent negatives for hypoglycemia include no dizziness, headaches, nervousness/anxiousness, seizures or sweats. Pertinent negatives for diabetes include no blurred vision, no chest pain, no fatigue, no polydipsia and no polyuria. There are no hypoglycemic complications. Symptoms are stable. There are no diabetic complications. There are no known risk factors for coronary artery disease.    Lab Results  Component Value Date   NA 138 07/03/2022   K 4.1 07/03/2022   CO2 24 07/03/2022   GLUCOSE 285 (H) 07/03/2022   BUN 20 07/03/2022   CREATININE 1.38 (H) 07/03/2022   CALCIUM 9.7 07/03/2022   EGFR 54 (L) 07/03/2022   GFRNONAA 52 (L) 01/06/2021   Lab Results  Component Value Date   CHOL 149 07/03/2022   HDL 30 (L) 07/03/2022   LDLCALC 74 07/03/2022   TRIG 273 (H) 07/03/2022   CHOLHDL 4.1 07/16/2018   No results found for: "TSH" Lab Results  Component Value Date   HGBA1C 6.1 (H) 10/04/2022   Lab Results  Component Value Date   WBC 11.3 (H) 04/04/2018   HGB 9.7 (L) 04/04/2018   HCT 28.5 (L) 04/04/2018   MCV 85.9 04/04/2018   PLT 264 04/04/2018   Lab Results  Component Value Date   ALT 18 07/03/2022   AST 18 07/03/2022   ALKPHOS 108 07/03/2022   BILITOT 0.2 07/03/2022   No results found for: "25OHVITD2", "25OHVITD3", "VD25OH"   Review of Systems  Constitutional:  Negative for appetite change, chills, fatigue, fever and malaise/fatigue.  HENT:  Negative for drooling, ear discharge, ear pain and sore throat.   Eyes:  Negative for blurred vision.  Respiratory:  Negative for cough, shortness of breath and wheezing.   Cardiovascular:  Negative for chest pain, palpitations, orthopnea, leg swelling and PND.  Gastrointestinal:  Negative for abdominal pain, blood in stool, constipation, diarrhea and nausea.   Endocrine: Negative for polydipsia and polyuria.  Genitourinary:  Negative for dysuria, frequency, hematuria and urgency.  Musculoskeletal:  Negative for back pain, myalgias and neck pain.  Skin:  Negative for rash.  Allergic/Immunologic: Negative for environmental allergies.  Neurological:  Negative for dizziness, seizures and headaches.  Hematological:  Does not bruise/bleed easily.  Psychiatric/Behavioral:  Positive for depression. Negative for decreased concentration and suicidal ideas. The patient is not nervous/anxious and does not have insomnia.     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   Presence of left artificial knee joint 01/24/2018   Recurrent major depressive disorder, in partial remission ([place]) 01/16/2018   Primary osteoarthritis of left knee 12/26/2017   Chronic allergic rhinitis 01/05/2017   Essential hypertension 07/20/2015   Hyperlipidemia 07/20/2015   Erectile dysfunction 07/20/2015   Depression 07/20/2015   Allergic rhinitis due to pollen 07/20/2015   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     Past Surgical History:  Procedure Laterality Date   COLONOSCOPY  2016   cleared for 5 yrs- Dr Candace Cruise   COLONOSCOPY WITH PROPOFOL N/A 03/10/2020   Procedure: COLONOSCOPY WITH PROPOFOL;   Surgeon: Jonathon Bellows, MD;  [facility] ENDOSCOPY;  Service: Gastroenterology;  Laterality: N/A;   HERNIA REPAIR     JOINT REPLACEMENT Left 12/2017   TKR   JOINT REPLACEMENT Right 03/2018   Hip   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   Procedure: TOTAL HIP ARTHROPLASTY;  Surgeon: Corky Mull, MD;  Location: [facility] ORS;  Service: Orthopedics;  Laterality: Right;    Social History   Tobacco Use   Smoking status: Former    Packs/day: 1.50    Years: 30.00    Total pack years: 45.00    Types: Cigarettes    Quit date: 1996    Years since quitting: 28.1   Smokeless tobacco: Never   Tobacco comments:    smoking cessatioinn materials not required  Vaping Use   Vaping Use: Never used  Substance Use Topics   Alcohol use: Yes    Alcohol/week: 0.0 standard drinks of alcohol    Comment: only occasionally   Drug use: No     Medication list has been reviewed and updated.  Current Meds  Medication Sig   amLODipine-benazepril (LOTREL) 10-20 MG capsule Take 1 capsule by mouth daily   Ascorbic Acid (VITAMIN C) 100 MG tablet Take 1,000 mg by mouth daily.    aspirin EC 81 MG tablet Take 81 mg by mouth daily.    atorvastatin (LIPITOR) 20 MG tablet One a day  blood glucose meter kit and supplies KIT Dispense based on patient and insurance preference. Use up to four times daily as directed.   carvedilol (COREG) 6.25 MG tablet Take 1 tablet by mouth twice daily   cholecalciferol (VITAMIN D) 400 units TABS tablet Take 1,000 Units by mouth daily.    diclofenac Sodium (VOLTAREN) 1 % GEL Apply topically. PRN   fenofibrate (TRICOR) 145 MG tablet TAKE ONE TABLET BY MOUTH AT 6 IN THE MORNING   fluticasone (FLONASE) 50 MCG/ACT nasal spray Place 1 spray into both nostrils daily as needed (for sinus/allergy issues.).   Garlic 007 MG CAPS Take 1,000 mg by mouth daily.    glipiZIDE (GLUCOTROL XL) 2.5 MG 24 hr tablet Take 1 tablet (2.5 mg total) by mouth daily with breakfast.   glucose blood (ONETOUCH  ULTRA) test strip Use up to 4 times daily as directed   hydrochlorothiazide (MICROZIDE) 12.5 MG capsule TAKE (1) CAPSULE BY MOUTH EVERY DAY AT 6AU THE MORNING   loratadine (ALLERGY RELIEF) 10 MG tablet TAKE (1) TABLET BY MOUTH EVERY DAY   metFORMIN (GLUCOPHAGE) 500 MG tablet TAKE (1) TABLET BY MOUTH TWICE DAILY WITH A MEAL.   Multiple Vitamin (MULTI-VITAMINS) TABS Take 1 tablet by mouth daily at 6 (six) AM.   Omega-3 Fatty Acids (FISH OIL MAXIMUM STRENGTH) 1200 MG CPDR    sertraline (ZOLOFT) 50 MG tablet Take 1 tablet (50 mg total) by mouth daily at 6 (six) AM.   [DISCONTINUED] doxycycline (VIBRA-TABS) 100 MG tablet Take 1 tablet (100 mg total) by mouth 2 (two) times daily.       01/03/2023   10:57 AM 10/04/2022   10:20 AM 09/27/2022   11:37 AM 07/03/2022   10:49 AM  GAD 7 : Generalized Anxiety Score  Nervous, Anxious, on Edge 0 0 0 0  Control/stop worrying 0 0 0 0  Worry too much - different things 0 0 0 0  Trouble relaxing 0 0 0 0  Restless 0 0 0 0  Easily annoyed or irritable 0 0 0 0  Afraid - awful might happen 0 0 0 0  Total GAD 7 Score 0 0 0 0  Anxiety Difficulty Not difficult at all Not difficult at all Not difficult at all Not difficult at all       01/03/2023   10:57 AM 10/04/2022   10:20 AM 09/27/2022   11:37 AM  Depression screen PHQ 2/9  Decreased Interest 0 0 0  Down, Depressed, Hopeless 0 0 0  PHQ - 2 Score 0 0 0  Altered sleeping 0 0 0  Tired, decreased energy 0 0 0  Change in appetite 0 0 0  Feeling bad or failure about yourself  0 0 0  Trouble concentrating 0 0 0  Moving slowly or fidgety/restless 0 0 0  Suicidal thoughts 0 0 0  PHQ-9 Score 0 0 0  Difficult doing work/chores Not difficult at all Not difficult at all Not difficult at all    BP Readings from Last 3 Encounters:  01/03/23 124/78  10/04/22 110/76  09/27/22 122/78    Physical Exam Vitals and nursing note reviewed.  Constitutional:      General: He is not irritable. HENT:     Head:  Normocephalic.     Right Ear: Tympanic membrane and external ear normal.     Left Ear: Tympanic membrane and external ear normal.     Nose: Nose normal. No congestion or rhinorrhea.     Mouth/Throat:  Mouth: Mucous membranes are moist.  Eyes:     General: No scleral icterus.       Right eye: No discharge.        Left eye: No discharge.     Conjunctiva/sclera: Conjunctivae normal.     Pupils: Pupils are equal, round, and reactive to light.  Neck:     Thyroid: No thyromegaly.     Vascular: No JVD.     Trachea: No tracheal deviation.  Cardiovascular:     Rate and Rhythm: Normal rate and regular rhythm.     Heart sounds: Normal heart sounds. No murmur heard.    No friction rub. No gallop.  Pulmonary:     Effort: No respiratory distress.     Breath sounds: Normal breath sounds. No stridor. No wheezing, rhonchi or rales.  Chest:     Chest wall: No tenderness.  Abdominal:     General: Bowel sounds are normal.     Palpations: Abdomen is soft. There is no mass.     Tenderness: There is no abdominal tenderness. There is no guarding or rebound.  Musculoskeletal:        General: No tenderness. Normal range of motion.     Cervical back: Normal range of motion and neck supple.  Lymphadenopathy:     Cervical: No cervical adenopathy.  Skin:    General: Skin is warm.     Findings: No erythema or rash.  Neurological:     Mental Status: He is alert and oriented to person, place, and time.     Cranial Nerves: No cranial nerve deficit.     Deep Tendon Reflexes: Reflexes are normal and symmetric.     Wt Readings from Last 3 Encounters:  01/03/23 234 lb (106.1 kg)  10/04/22 238 lb (108 kg)  09/27/22 237 lb (107.5 kg)    BP 124/78   Pulse 62   Ht '6\' 1"'$  (1.854 m)   Wt 234 lb (106.1 kg)   SpO2 93%   BMI 30.87 kg/m   Assessment and Plan: 1. Essential hypertension Chronic.  Controlled.  Stable.  Blood pressure 124/78.  Continue amlodipine benazepril 10-20 mg once a day, carvedilol  6.25 twice a day, hydrochlorothiazide 12.5 mg once a day.  Will check renal function panel for current level of control of GFR and electrolytes. - amLODipine-benazepril (LOTREL) 10-20 MG capsule; Take 1 capsule by mouth daily  Dispense: 90 capsule; Refill: 1 - carvedilol (COREG) 6.25 MG tablet; Take 1 tablet by mouth twice daily  Dispense: 180 tablet; Refill: 1 - hydrochlorothiazide (MICROZIDE) 12.5 MG capsule; TAKE (1) CAPSULE BY MOUTH EVERY DAY AT [place] THE MORNING  Dispense: 90 capsule; Refill: 1 - Renal Function Panel  2. Hyperlipidemia, unspecified hyperlipidemia type Chronic.  Controlled.  Stable.  Continue atorvastatin 20 mg once a day and fenofibrate 145 mg once a day as well.  Will check lipid for triglyceride and LDL management. - atorvastatin (LIPITOR) 20 MG tablet; One a day  Dispense: 90 tablet; Refill: 1 - fenofibrate (TRICOR) 145 MG tablet; TAKE ONE TABLET BY MOUTH AT 6 IN THE MORNING  Dispense: 90 tablet; Refill: 1 - Lipid Panel With LDL/HDL Ratio  3. Seasonal allergic rhinitis due to pollen Chronic.  Controlled.  Stable.  Continue complication Flonase loratadine on a daily basis. - fluticasone (FLONASE) 50 MCG/ACT nasal spray; Place 1 spray into both nostrils daily as needed (for sinus/allergy issues.).  Dispense: 16 g; Refill: 5 - loratadine (ALLERGY RELIEF) 10 MG tablet; TAKE (1) TABLET  BY MOUTH EVERY DAY  Dispense: 90 tablet; Refill: 1  4. New onset type 2 diabetes mellitus ([place]) .  Controlled.  Stable.  Continue glipizide XL 2.5 mg once a day and metformin 500 mg twice a day. - glipiZIDE (GLUCOTROL XL) 2.5 MG 24 hr tablet; Take 1 tablet (2.5 mg total) by mouth daily with breakfast.  Dispense: 90 tablet; Refill: 0 - metFORMIN (GLUCOPHAGE) 500 MG tablet; TAKE (1) TABLET BY MOUTH TWICE DAILY WITH A MEAL.  Dispense: 180 tablet; Refill: 0  5. Recurrent major depressive disorder, in partial remission (H[facility]) Chronic.  Controlled.  Stable.  PHQ is 0 GAD score is 0 continue sertraline  50 mg 1 every morning. - sertraline (ZOLOFT) 50 MG tablet; Take 1 tablet (50 mg total) by mouth daily at 6 (six) AM.  Dispense: 90 tablet; Refill: 1  6. Hyperglycemia As noted above we will obtain A1c for evaluation of current level of control with glipizide and metformin.  We will also schedule for ophthalmology evaluation to evaluate for diabetic retinopathy. - HgB A1c - Ambulatory referral to Ophthalmology     Otilio Miu, MD

## 2023-01-04 LAB — LIPID PANEL WITH LDL/HDL RATIO
Cholesterol, Total: 141 mg/dL (ref 100–199)
HDL: 34 mg/dL — ABNORMAL LOW (ref >39)
LDL Chol Calc (NIH): 77 mg/dL (ref 0–99)
LDL/HDL Ratio: 2.3 ratio (ref 0.0–3.6)
Triglycerides: 174 mg/dL — ABNORMAL HIGH (ref 0–149)
VLDL Cholesterol Cal: 30 mg/dL (ref 5–40)

## 2023-01-04 LAB — RENAL FUNCTION PANEL
Albumin: 4.5 g/dL (ref 3.8–4.8)
BUN/Creatinine Ratio: 18 (ref 10–24)
BUN: 29 mg/dL — ABNORMAL HIGH (ref 8–27)
CO2: 22 mmol/L (ref 20–29)
Calcium: 9.9 mg/dL (ref 8.6–10.2)
Chloride: 102 mmol/L (ref 96–106)
Creatinine, Ser: 1.57 mg/dL — ABNORMAL HIGH (ref 0.76–1.27)
Glucose: 91 mg/dL (ref 70–99)
Phosphorus: 3.6 mg/dL (ref 2.8–4.1)
Potassium: 4.7 mmol/L (ref 3.5–5.2)
Sodium: 139 mmol/L (ref 134–144)
eGFR: 46 mL/min/{1.73_m2} — ABNORMAL LOW (ref >59)

## 2023-01-04 LAB — HEMOGLOBIN A1C
Est. average glucose Bld gHb Est-mCnc: 128 mg/dL
Hgb A1c MFr Bld: 6.1 % — ABNORMAL HIGH (ref 4.8–5.6)

## 2023-01-17 ENCOUNTER — Ambulatory Visit (INDEPENDENT_AMBULATORY_CARE_PROVIDER_SITE_OTHER): Payer: Medicare HMO

## 2023-01-17 VITALS — BP 132/70 | Ht 73.0 in | Wt 238.2 lb

## 2023-01-17 DIAGNOSIS — Z0000 Encounter for general adult medical examination without abnormal findings: Secondary | ICD-10-CM

## 2023-01-17 NOTE — Patient Instructions (Signed)
Gary Kramer , Thank you for taking time to come for your Medicare Wellness Visit. I appreciate your ongoing commitment to your health goals. Please review the following plan we discussed and let me know if I can assist you in the future.   These are the goals we discussed:  Goals      DIET - EAT MORE FRUITS AND VEGETABLES     DIET - INCREASE WATER INTAKE     Recommend to drink at least 6-8 8oz glasses of water per day.     Increase physical activity     Pt would like to increase physical activity over the next year as tolerated.        This is a list of the screening recommended for you and due dates:  Health Maintenance  Topic Date Due   COVID-19 Vaccine (5 - 2023-24 season) 01/19/2023*   Colon Cancer Screening  09/28/2023*   Hepatitis C Screening: USPSTF Recommendation to screen - Ages 18-75 yo.  09/28/2023*   Yearly kidney health urinalysis for diabetes  10/05/2023   Yearly kidney function blood test for diabetes  01/04/2024   Medicare Annual Wellness Visit  01/18/2024   DTaP/Tdap/Td vaccine (2 - Td or Tdap) 01/05/2027   Pneumonia Vaccine  Completed   Flu Shot  Completed   Zoster (Shingles) Vaccine  Completed   HPV Vaccine  Aged Out  *Topic was postponed. The date shown is not the original due date.    Advanced directives: yes  Conditions/risks identified: no  Next appointment: Follow up in one year for your annual wellness visit. 01/23/24 @ 3:00 pm in person  Preventive Care 75 Years and Older, Male  Preventive care refers to lifestyle choices and visits with your health care provider that can promote health and wellness. What does preventive care include? A yearly physical exam. This is also called an annual well check. Dental exams once or twice a year. Routine eye exams. Ask your health care provider how often you should have your eyes checked. Personal lifestyle choices, including: Daily care of your teeth and gums. Regular physical activity. Eating a healthy  diet. Avoiding tobacco and drug use. Limiting alcohol use. Practicing safe sex. Taking low doses of aspirin every day. Taking vitamin and mineral supplements as recommended by your health care provider. What happens during an annual well check? The services and screenings done by your health care provider during your annual well check will depend on your age, overall health, lifestyle risk factors, and family history of disease. Counseling  Your health care provider may ask you questions about your: Alcohol use. Tobacco use. Drug use. Emotional well-being. Home and relationship well-being. Sexual activity. Eating habits. History of falls. Memory and ability to understand (cognition). Work and work Statistician. Screening  You may have the following tests or measurements: Height, weight, and BMI. Blood pressure. Lipid and cholesterol levels. These may be checked every 5 years, or more frequently if you are over 17 years old. Skin check. Lung cancer screening. You may have this screening every year starting at age 75 if you have a 30-pack-year history of smoking and currently smoke or have quit within the past 15 years. Fecal occult blood test (FOBT) of the stool. You may have this test every year starting at age 20. Flexible sigmoidoscopy or colonoscopy. You may have a sigmoidoscopy every 5 years or a colonoscopy every 10 years starting at age 4. Prostate cancer screening. Recommendations will vary depending on your family history and other  risks. Hepatitis C blood test. Hepatitis B blood test. Sexually transmitted disease (STD) testing. Diabetes screening. This is done by checking your blood sugar (glucose) after you have not eaten for a while (fasting). You may have this done every 1-3 years. Abdominal aortic aneurysm (AAA) screening. You may need this if you are a current or former smoker. Osteoporosis. You may be screened starting at age 28 if you are at high risk. Talk with  your health care provider about your test results, treatment options, and if necessary, the need for more tests. Vaccines  Your health care provider may recommend certain vaccines, such as: Influenza vaccine. This is recommended every year. Tetanus, diphtheria, and acellular pertussis (Tdap, Td) vaccine. You may need a Td booster every 10 years. Zoster vaccine. You may need this after age 75. Pneumococcal 13-valent conjugate (PCV13) vaccine. One dose is recommended after age 28. Pneumococcal polysaccharide (PPSV23) vaccine. One dose is recommended after age 60. Talk to your health care provider about which screenings and vaccines you need and how often you need them. This information is not intended to replace advice given to you by your health care provider. Make sure you discuss any questions you have with your health care provider. Document Released: 12/10/2015 Document Revised: 08/02/2016 Document Reviewed: 09/14/2015 Elsevier Interactive Patient Education  2017 [place] Prevention in the Home Falls can cause injuries. They can happen to people of all ages. There are many things you can do to make your home safe and to help prevent falls. What can I do on the outside of my home? Regularly fix the edges of walkways and driveways and fix any cracks. Remove anything that might make you trip as you walk through a door, such as a raised step or threshold. Trim any bushes or trees on the path to your home. Use bright outdoor lighting. Clear any walking paths of anything that might make someone trip, such as rocks or tools. Regularly check to see if handrails are loose or broken. Make sure that both sides of any steps have handrails. Any raised decks and porches should have guardrails on the edges. Have any leaves, snow, or ice cleared regularly. Use sand or salt on walking paths during winter. Clean up any spills in your garage right away. This includes oil or grease spills. What  can I do in the bathroom? Use night lights. Install grab bars by the toilet and in the tub and shower. Do not use towel bars as grab bars. Use non-skid mats or decals in the tub or shower. If you need to sit down in the shower, use a plastic, non-slip stool. Keep the floor dry. Clean up any water that spills on the floor as soon as it happens. Remove soap buildup in the tub or shower regularly. Attach bath mats securely with double-sided non-slip rug tape. Do not have throw rugs and other things on the floor that can make you trip. What can I do in the bedroom? Use night lights. Make sure that you have a light by your bed that is easy to reach. Do not use any sheets or blankets that are too big for your bed. They should not hang down onto the floor. Have a firm chair that has side arms. You can use this for support while you get dressed. Do not have throw rugs and other things on the floor that can make you trip. What can I do in the kitchen? Clean up any spills right away.  Avoid walking on wet floors. Keep items that you use a lot in easy-to-reach places. If you need to reach something above you, use a strong step stool that has a grab bar. Keep electrical cords out of the way. Do not use floor polish or wax that makes floors slippery. If you must use wax, use non-skid floor wax. Do not have throw rugs and other things on the floor that can make you trip. What can I do with my stairs? Do not leave any items on the stairs. Make sure that there are handrails on both sides of the stairs and use them. Fix handrails that are broken or loose. Make sure that handrails are as long as the stairways. Check any carpeting to make sure that it is firmly attached to the stairs. Fix any carpet that is loose or worn. Avoid having throw rugs at the top or bottom of the stairs. If you do have throw rugs, attach them to the floor with carpet tape. Make sure that you have a light switch at the top of the  stairs and the bottom of the stairs. If you do not have them, ask someone to add them for you. What else can I do to help prevent falls? Wear shoes that: Do not have high heels. Have rubber bottoms. Are comfortable and fit you well. Are closed at the toe. Do not wear sandals. If you use a stepladder: Make sure that it is fully opened. Do not climb a closed stepladder. Make sure that both sides of the stepladder are locked into place. Ask someone to hold it for you, if possible. Clearly mark and make sure that you can see: Any grab bars or handrails. First and last steps. Where the edge of each step is. Use tools that help you move around (mobility aids) if they are needed. These include: Canes. Walkers. Scooters. Crutches. Turn on the lights when you go into a dark area. Replace any light bulbs as soon as they burn out. Set up your furniture so you have a clear path. Avoid moving your furniture around. If any of your floors are uneven, fix them. If there are any pets around you, be aware of where they are. Review your medicines with your doctor. Some medicines can make you feel dizzy. This can increase your chance of falling. Ask your doctor what other things that you can do to help prevent falls. This information is not intended to replace advice given to you by your health care provider. Make sure you discuss any questions you have with your health care provider. Document Released: 09/09/2009 Document Revised: 04/20/2016 Document Reviewed: 12/18/2014 Elsevier Interactive Patient Education  2017 [employer].

## 2023-01-17 NOTE — Progress Notes (Addendum)
Subjective:   Gary Kramer is a 77 y.o. male who presents for Medicare Annual/Subsequent preventive examination.  Review of Systems     Cardiac Risk Factors include: advanced age (>48mn, >>87women);dyslipidemia;male gender;hypertension     Objective:    Today's Vitals   01/17/23 1438  BP: 132/70  Weight: 238 lb 3.2 oz (108 kg)  Height: '6\' 1"'$  (1.854 m)   Body mass index is 31.43 kg/m.     01/17/2023    2:56 PM 01/16/2022    2:52 PM 01/12/2021    2:52 PM 01/12/2020    3:02 PM 01/08/2019    1:48 PM 04/02/2018    1:52 PM 03/20/2018    1:28 PM  Advanced Directives  Does Patient Have a Medical Advance Directive? Yes Yes Yes Yes Yes Yes Yes  Type of AParamedicof ARose Hill AcresLiving will H[place]Living will HR[place]Living will HHatt[place]burgLiving will HT[place]Living will HAdjuntasLiving will HEast LakeLiving will  Does patient want to make changes to medical advance directive? No - Patient declined     No - Patient declined   Copy of HFlorienin Chart? Yes - validated most recent copy scanned in chart (See row information) Yes - validated most recent copy scanned in chart (See row information) Yes - validated most recent copy scanned in chart (See row information) Yes - validated most recent copy scanned in chart (See row information) Yes - validated most recent copy scanned in chart (See row information) Yes No - copy requested    Current Medications (verified) Outpatient Encounter Medications as of 01/17/2023  Medication Sig   amLODipine-benazepril (LOTREL) 10-20 MG capsule Take 1 capsule by mouth daily   Ascorbic Acid (VITAMIN C) 100 MG tablet Take 1,000 mg by mouth daily.    aspirin EC 81 MG tablet Take 81 mg by mouth daily.    atorvastatin (LIPITOR) 20 MG tablet One a day   blood glucose meter kit and supplies KIT  Dispense based on patient and insurance preference. Use up to four times daily as directed.   carvedilol (COREG) 6.25 MG tablet Take 1 tablet by mouth twice daily   cholecalciferol (VITAMIN D) 400 units TABS tablet Take 1,000 Units by mouth daily.    diclofenac Sodium (VOLTAREN) 1 % GEL Apply topically. PRN   fenofibrate (TRICOR) 145 MG tablet TAKE ONE TABLET BY MOUTH AT 6 IN THE MORNING   fluticasone (FLONASE) 50 MCG/ACT nasal spray Place 1 spray into both nostrils daily as needed (for sinus/allergy issues.).   Garlic [id number]-123456789MG CAPS Take 1,000 mg by mouth daily.    glipiZIDE (GLUCOTROL XL) 2.5 MG 24 hr tablet Take 1 tablet (2.5 mg total) by mouth daily with breakfast.   glucose blood (ONETOUCH ULTRA) test strip Use up to 4 times daily as directed   hydrochlorothiazide (MICROZIDE) 12.5 MG capsule TAKE (1) CAPSULE BY MOUTH EVERY DAY AT [id number] MORNING   loratadine (ALLERGY RELIEF) 10 MG tablet TAKE (1) TABLET BY MOUTH EVERY DAY   metFORMIN (GLUCOPHAGE) 500 MG tablet TAKE (1) TABLET BY MOUTH TWICE DAILY WITH A MEAL.   Multiple Vitamin (MULTI-VITAMINS) TABS Take 1 tablet by mouth daily at 6 (six) AM.   Omega-3 Fatty Acids (FISH OIL MAXIMUM STRENGTH) 1200 MG CPDR    sertraline (ZOLOFT) 50 MG tablet Take 1 tablet (50 mg total) by mouth daily at 6 (six) AM.  No facility-administered encounter medications on file as of 01/17/2023.    Allergies (verified) Cefaclor and Penicillin v potassium   History: Past Medical History:  Diagnosis Date   Allergy    Arthritis    Depression    Erectile dysfunction    Hyperlipidemia    Hypertension    Past Surgical History:  Procedure Laterality Date   COLONOSCOPY  2016   cleared for 5 yrs- Dr Candace Cruise   COLONOSCOPY WITH PROPOFOL N/A 03/10/2020   Procedure: COLONOSCOPY WITH PROPOFOL;  Surgeon: Jonathon Bellows, MD;  Location: [facility] ENDOSCOPY;  Service: Gastroenterology;  Laterality: N/A;   HERNIA REPAIR     JOINT REPLACEMENT Left 12/2017   TKR   JOINT REPLACEMENT  Right 03/2018   Hip   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   Procedure: TOTAL HIP ARTHROPLASTY;  Surgeon: Corky Mull, MD;  Location: [facility] ORS;  Service: Orthopedics;  Laterality: Right;   Family History  Problem Relation Age of Onset   Cancer Mother        brain   Stroke Father    Social History   Socioeconomic History   Marital status: Widowed    Spouse name: Not on file   Number of children: 2   Years of education: Not on file   Highest education level: Bachelor's degree (e.g., BA, AB, BS)  Occupational History   Occupation: Retired  Tobacco Use   Smoking status: Former    Packs/day: 1.50    Years: 30.00    Total pack years: 45.00    Types: Cigarettes    Quit date: 1996    Years since quitting: 28.1   Smokeless tobacco: Never   Tobacco comments:    smoking cessatioinn materials not required  Vaping Use   Vaping Use: Never used  Substance and Sexual Activity   Alcohol use: Yes    Alcohol/week: 0.0 standard drinks of alcohol    Comment: only occasionally   Drug use: No   Sexual activity: Not Currently  Other Topics Concern   Not on file  Social History Narrative   Pt lives alone   Social Determinants of Health   Financial Resource Strain: Low Risk  (01/17/2023)   Overall Financial Resource Strain (CARDIA)    Difficulty of Paying Living Expenses: Not hard at all  Food Insecurity: No Food Insecurity (01/17/2023)   Hunger Vital Sign    Worried About Running Out of Food in the Last Year: Never true    Ran Out of Food in the Last Year: Never true  Transportation Needs: No Transportation Needs (01/17/2023)   PRAPARE - Hydrologist (Medical): No    Lack of Transportation (Non-Medical): No  Physical Activity: Inactive (01/17/2023)   Exercise Vital Sign    Days of Exercise per Week: 0 days    Minutes of Exercise per Session: 0 min  Stress: No Stress Concern Present (01/17/2023)   [place]    Feeling of Stress : Not at all  Social Connections: Socially Isolated (01/17/2023)   Social Connection and Isolation Panel [NHANES]    Frequency of Communication with Friends and Family: More than three times a week    Frequency of Social Gatherings with Friends and Family: Twice a week    Attends Religious Services: Never    Marine scientist or Organizations: No    Attends Archivist Meetings: Never    Marital Status: Widowed  Tobacco Counseling Counseling given: Not Answered Tobacco comments: smoking cessatioinn materials not required   Clinical Intake:  Pre-visit preparation completed: Yes  Pain : No/denies pain     Nutritional Risks: None Diabetes: Yes CBG done?: No Did pt. bring in CBG monitor from home?: No  How often do you need to have someone help you when you read instructions, pamphlets, or other written materials from your doctor or pharmacy?: 1 - Never  Diabetic?yes Nutrition Risk Assessment:  Has the patient had any N/V/D within the last 2 months?  No  Does the patient have any non-healing wounds?  No  Has the patient had any unintentional weight loss or weight gain?  No   Diabetes:  Is the patient diabetic?  Yes  If diabetic, was a CBG obtained today?  No  Did the patient bring in their glucometer from home?  No  How often do you monitor your CBG's? Several times per week.   Financial Strains and Diabetes Management:  Are you having any financial strains with the device, your supplies or your medication? No .  Does the patient want to be seen by Chronic Care Management for management of their diabetes?  No  Would the patient like to be referred to a Nutritionist or for Diabetic Management?  No   Diabetic Exams:  Diabetic Eye Exam: Completed has appointment of 2 weeks.  Pt has been advised about the importance in completing this exam.   Diabetic Foot Exam: Completed no. Pt has been advised about the  importance in completing this exam.   Interpreter Needed?: No  Information entered by :: Kirke Shaggy, LPN   Activities of Daily Living    01/17/2023    3:03 PM  In your present state of health, do you have any difficulty performing the following activities:  Hearing? 0  Vision? 0  Difficulty concentrating or making decisions? 0  Walking or climbing stairs? 0  Dressing or bathing? 0  Doing errands, shopping? 0  Preparing Food and eating ? N  Using the Toilet? N  In the past six months, have you accidently leaked urine? N  Do you have problems with loss of bowel control? N  Managing your Medications? N  Managing your Finances? N  Housekeeping or managing your Housekeeping? N    Patient Care Team: Juline Patch, MD as PCP - General (Family Medicine) Anthonette Legato, MD (Nephrology) Jonathon Bellows, MD as Consulting Physician (Gastroenterology)  Indicate any recent Medical Services you may have received from other than [facility] providers in the past year (date may be approximate).     Assessment:   This is a routine wellness examination for [place].  Hearing/Vision screen Hearing Screening - Comments:: No aids Vision Screening - Comments:: Needs glasses- Dr. Edison Pace  Dietary issues and exercise activities discussed: Current Exercise Habits: The patient does not participate in regular exercise at present   Goals Addressed             This Visit's Progress    DIET - EAT MORE FRUITS AND VEGETABLES         Depression Screen    01/17/2023    2:51 PM 01/03/2023   10:57 AM 10/04/2022   10:20 AM 09/27/2022   11:37 AM 07/03/2022   10:49 AM 01/16/2022    2:51 PM 01/02/2022   10:43 AM  PHQ 2/9 Scores  PHQ - 2 Score 0 0 0 0 0 0 0  PHQ- 9 Score 0 0 0 0 0 0  0    Fall Risk    01/17/2023    2:59 PM 01/03/2023   10:57 AM 10/04/2022   10:20 AM 09/27/2022   11:36 AM 07/03/2022   10:49 AM  Fall Risk   Falls in the past year? 0 0 0 0 0  Number falls in past yr: 0 0 0 0 0  Injury with  Fall? 0 0 0 0 0  Risk for fall due to : No Fall Risks No Fall Risks No Fall Risks  No Fall Risks  Follow up Falls prevention discussed;Falls evaluation completed Falls evaluation completed Falls evaluation completed Falls evaluation completed Falls evaluation completed    FALL RISK PREVENTION PERTAINING TO THE HOME:  Any stairs in or around the home? Yes  If so, are there any without handrails? No  Home free of loose throw rugs in walkways, pet beds, electrical cords, etc? Yes  Adequate lighting in your home to reduce risk of falls? Yes   ASSISTIVE DEVICES UTILIZED TO PREVENT FALLS:  Life alert? Yes  Use of a cane, walker or w/c? Yes - uses cane occasionally Grab bars in the bathroom? Yes  Shower chair or bench in shower? No  Elevated toilet seat or a handicapped toilet? Yes   TIMED UP AND GO:  Was the test performed? Yes .  Length of time to ambulate 10 feet: 4 sec.   Gait steady and fast without use of assistive device  Cognitive Function:        01/17/2023    3:14 PM 01/12/2020    3:06 PM 01/08/2019    1:58 PM 01/07/2018    2:22 PM 01/05/2017    2:11 PM  6CIT Screen  What Year? 0 points 0 points 0 points 0 points 0 points  What month? 0 points 0 points 0 points 0 points 0 points  What time? 0 points 0 points 0 points 0 points 0 points  Count back from 20 0 points 0 points 0 points 0 points 0 points  Months in reverse 0 points 0 points 0 points 0 points 0 points  Repeat phrase 0 points 0 points 0 points 0 points 0 points  Total Score 0 points 0 points 0 points 0 points 0 points    Immunizations Immunization History  Administered Date(s) Administered   Fluad Quad(high Dose 65+) 08/05/2019, 08/03/2020, 08/02/2021, 08/07/2022   Influenza, High Dose Seasonal PF 08/17/2017, 08/05/2018   Influenza,inj,Quad PF,6+ Mos 09/13/2015, 08/28/2016   PFIZER(Purple Top)SARS-COV-2 Vaccination 01/23/2020, 02/24/2020, 08/27/2020, 06/27/2021   PNEUMOCOCCAL CONJUGATE-20 01/02/2022    Pneumococcal Conjugate-13 09/18/2014   Pneumococcal Polysaccharide-23 12/28/2012, 01/07/2018   Tdap 01/05/2017   Zoster Recombinat (Shingrix) 02/08/2017, 06/13/2017    TDAP status: Up to date  Flu Vaccine status: Up to date  Pneumococcal vaccine status: Up to date  Covid-19 vaccine status: Completed vaccines  Qualifies for Shingles Vaccine? Yes   Zostavax completed No   Shingrix Completed?: Yes  Screening Tests Health Maintenance  Topic Date Due   COVID-19 Vaccine (5 - 2023-24 season) 01/19/2023 (Originally 07/28/2022)   COLONOSCOPY (Pts 45-59yr Insurance coverage will need to be confirmed)  09/28/2023 (Originally 03/10/2021)   Hepatitis C Screening  09/28/2023 (Originally 01/03/1964)   Diabetic kidney evaluation - Urine ACR  10/05/2023   Diabetic kidney evaluation - eGFR measurement  01/04/2024   Medicare Annual Wellness (AWV)  01/18/2024   DTaP/Tdap/Td (2 - Td or Tdap) 01/05/2027   Pneumonia Vaccine 75 Years old  Completed   INFLUENZA VACCINE  Completed  Zoster Vaccines- Shingrix  Completed   HPV VACCINES  Aged Out    Health Maintenance  There are no preventive care reminders to display for this patient.   Colorectal cancer screening: No longer required.   Lung Cancer Screening: (Low Dose CT Chest recommended if Age 28-80 years, 30 pack-year currently smoking OR have quit w/in 15years.) does not qualify.     Additional Screening:  Hepatitis C Screening: does qualify; Completed no  Vision Screening: Recommended annual ophthalmology exams for early detection of glaucoma and other disorders of the eye. Is the patient up to date with their annual eye exam?  Yes  Who is the provider or what is the name of the office in which the patient attends annual eye exams? Dr.King If pt is not established with a provider, would they like to be referred to a provider to establish care? No .   Dental Screening: Recommended annual dental exams for proper oral hygiene  Community  Resource Referral / Chronic Care Management: CRR required this visit?  No   CCM required this visit?  No      Plan:     I have personally reviewed and noted the following in the patient's chart:   Medical and social history Use of alcohol, tobacco or illicit drugs  Current medications and supplements including opioid prescriptions. Patient is not currently taking opioid prescriptions. Functional ability and status Nutritional status Physical activity Advanced directives List of other physicians Hospitalizations, surgeries, and ER visits in previous 12 months Vitals Screenings to include cognitive, depression, and falls Referrals and appointments  In addition, I have reviewed and discussed with patient certain preventive protocols, quality metrics, and best practice recommendations. A written personalized care plan for preventive services as well as general preventive health recommendations were provided to patient.     Dionisio David, LPN   [id number]   Nurse Notes: none

## 2023-02-05 DIAGNOSIS — I10 Essential (primary) hypertension: Secondary | ICD-10-CM | POA: Diagnosis not present

## 2023-02-05 DIAGNOSIS — E1122 Type 2 diabetes mellitus with diabetic chronic kidney disease: Secondary | ICD-10-CM | POA: Diagnosis not present

## 2023-02-05 DIAGNOSIS — N1831 Chronic kidney disease, stage 3a: Secondary | ICD-10-CM | POA: Diagnosis not present

## 2023-02-12 ENCOUNTER — Other Ambulatory Visit: Payer: Self-pay | Admitting: Family Medicine

## 2023-02-12 DIAGNOSIS — E119 Type 2 diabetes mellitus without complications: Secondary | ICD-10-CM | POA: Diagnosis not present

## 2023-02-12 DIAGNOSIS — H2513 Age-related nuclear cataract, bilateral: Secondary | ICD-10-CM | POA: Diagnosis not present

## 2023-02-12 DIAGNOSIS — E785 Hyperlipidemia, unspecified: Secondary | ICD-10-CM

## 2023-02-12 LAB — HM DIABETES EYE EXAM

## 2023-02-13 NOTE — Telephone Encounter (Signed)
Requested Prescriptions  Pending Prescriptions Disp Refills   atorvastatin (LIPITOR) 20 MG tablet [Pharmacy Med Name: ATORVASTATIN TAB 20MG ] 90 tablet 1    Sig: TAKE (1) TABLET BY MOUTH EVERY DAY     Cardiovascular:  Antilipid - Statins Failed - 02/12/2023 12:26 PM      Failed - Lipid Panel in normal range within the last 12 months    Cholesterol, Total  Date Value Ref Range Status  01/03/2023 141 100 - 199 mg/dL Final   LDL Chol Calc (NIH)  Date Value Ref Range Status  01/03/2023 77 0 - 99 mg/dL Final   HDL  Date Value Ref Range Status  01/03/2023 34 (L) >39 mg/dL Final   Triglycerides  Date Value Ref Range Status  01/03/2023 174 (H) 0 - 149 mg/dL Final         Passed - Patient is not pregnant      Passed - Valid encounter within last 12 months    Recent Outpatient Visits           1 month ago Essential hypertension   [place] [facility] [place], [place], MD   4 months ago New onset type 2 diabetes mellitus [facility]   [facility] [place], Deanna C, MD   4 months ago Acute maxillary sinusitis, recurrence not specified   [place] [facility] [place], Deanna C, MD   7 months ago Essential hypertension   [place] [facility] [place], Deanna C, MD   1 year ago Essential hypertension   [place] [facility] [place], [place], MD       Future Appointments             In 2 months Juline Patch, MD [place] [facility], [facility]

## 2023-03-20 ENCOUNTER — Telehealth: Payer: Self-pay | Admitting: Family Medicine

## 2023-03-20 NOTE — Telephone Encounter (Signed)
IF PATIENT CALLS BACK LET HIM KNOW WE HAD TO CHANGE HIS APPT DATE.

## 2023-03-23 ENCOUNTER — Ambulatory Visit
Admission: RE | Admit: 2023-03-23 | Discharge: 2023-03-23 | Disposition: A | Discharge status: Home / Self Care | Payer: Medicare HMO | Source: Ambulatory Visit | Attending: Family Medicine | Admitting: Family Medicine

## 2023-03-23 ENCOUNTER — Ambulatory Visit
Admission: RE | Admit: 2023-03-23 | Discharge: 2023-03-23 | Disposition: A | Discharge status: Home / Self Care | Payer: Medicare HMO | Attending: Family Medicine | Admitting: Family Medicine

## 2023-03-23 ENCOUNTER — Ambulatory Visit (INDEPENDENT_AMBULATORY_CARE_PROVIDER_SITE_OTHER): Payer: Medicare HMO | Admitting: Family Medicine

## 2023-03-23 ENCOUNTER — Other Ambulatory Visit: Payer: Self-pay

## 2023-03-23 ENCOUNTER — Encounter: Payer: Self-pay | Admitting: Family Medicine

## 2023-03-23 VITALS — BP 134/78 | HR 68 | Ht 73.0 in | Wt 243.0 lb

## 2023-03-23 DIAGNOSIS — M19071 Primary osteoarthritis, right ankle and foot: Secondary | ICD-10-CM

## 2023-03-23 DIAGNOSIS — M79671 Pain in right foot: Secondary | ICD-10-CM | POA: Insufficient documentation

## 2023-03-23 MED ORDER — PREDNISONE 50 MG PO TABS: 50.0000 mg | ORAL_TABLET | Freq: Every day | ORAL | 0 refills | Status: DC

## 2023-03-23 NOTE — Progress Notes (Signed)
Xray on foot ordered

## 2023-03-26 NOTE — Assessment & Plan Note (Signed)
Acute onset of right forefoot / midfoot pain with reported bruising, noted when standing up from a seated position. Able to bear weight with pain.   Examination with focal tenderness at the MTP and TMT, no laxity, no ecchymosis. He is able to bear weight and perform single leg heel raise without issue.  Findings are most consistent with midfoot arthralgia, cannot exclude fracture among other traumatic etiologies. - CAM boot - prednisone course due to degree of pain, weighed risks benefits with patient regarding fracture concern and steroids - Close follow-up with repeat x-rays if symptomatic

## 2023-03-26 NOTE — Progress Notes (Signed)
     Primary Care / Sports Medicine Office Visit  Patient Information:  Patient ID: Nathanael Krist, male DOB: 08/06/1946 Age: 77 y.o. MRN: [id number]   Treven Holtman is a pleasant 77 y.o. male presenting with the following:  Chief Complaint  Patient presents with   foot pain    Vitals:   03/23/23 1533  BP: 134/78  Pulse: 68  SpO2: 98%   Vitals:   03/23/23 1533  Weight: 243 lb (110.2 kg)  Height: 6\' 1"  (1.854 m)   Body mass index is 32.06 kg/m.  No results found.   Independent interpretation of notes and tests performed by another provider:   None  Procedures performed:   None  Pertinent History, Exam, Impression, and Recommendations:   Diontae was seen today for foot pain.  Osteoarthritis of right midfoot Assessment & Plan: Acute onset of right forefoot / midfoot pain with reported bruising, noted when standing up from a seated position. Able to bear weight with pain.   Examination with focal tenderness at the MTP and TMT, no laxity, no ecchymosis. He is able to bear weight and perform single leg heel raise without issue.  Findings are most consistent with midfoot arthralgia, cannot exclude fracture among other traumatic etiologies. - CAM boot - prednisone course due to degree of pain, weighed risks benefits with patient regarding fracture concern and steroids - Close follow-up with repeat x-rays if symptomatic   Other orders -     predniSONE; Take 1 tablet (50 mg total) by mouth daily.  Dispense: 7 tablet; Refill: 0     Orders & Medications Meds ordered this encounter  Medications   predniSONE (DELTASONE) 50 MG tablet    Sig: Take 1 tablet (50 mg total) by mouth daily.    Dispense:  7 tablet    Refill:  0   No orders of the defined types were placed in this encounter.    No follow-ups on file.     Jerrol Banana, MD, [facility]   Primary Care Sports Medicine Primary Care and Sports Medicine at [facility]

## 2023-03-30 ENCOUNTER — Encounter: Payer: Self-pay | Admitting: Family Medicine

## 2023-03-30 ENCOUNTER — Ambulatory Visit (INDEPENDENT_AMBULATORY_CARE_PROVIDER_SITE_OTHER): Payer: Medicare HMO | Admitting: Family Medicine

## 2023-03-30 VITALS — HR 72 | Ht 71.0 in | Wt 242.0 lb

## 2023-03-30 DIAGNOSIS — M19071 Primary osteoarthritis, right ankle and foot: Secondary | ICD-10-CM

## 2023-04-02 NOTE — Assessment & Plan Note (Signed)
Symptoms resolving / resolved following prednisone and CAM boot, x-rays and clinical course most consistent with OA flare. X-rays independently interpreted and reviewed with patient.  - Transition from CAM boot to standard shoes - Use topical diclofenac PRN - Start home exercises - Follow-up as-needed

## 2023-04-02 NOTE — Progress Notes (Signed)
     Primary Care / Sports Medicine Office Visit  Patient Information:  Patient ID: Gary Kramer, male DOB: 1946/07/21 Age: 77 y.o. MRN: [id number]   Gary Kramer is a pleasant 77 y.o. male presenting with the following:  Chief Complaint  Patient presents with   Osteoarthritis of right midfoot    Vitals:   03/30/23 1536  Pulse: 72  SpO2: 98%   Vitals:   03/30/23 1536  Weight: 242 lb (109.8 kg)  Height: 5\' 11"  (1.803 m)   Body mass index is 33.75 kg/m.  DG Foot Complete Right  Result Date: 03/27/2023 CLINICAL DATA:  Right foot pain. Pain in arch and top of right foot. EXAM: RIGHT FOOT COMPLETE - 3+ VIEW COMPARISON:  None Available. FINDINGS: There is a moderate degenerative spur extending laterally and proximally from the lateral base of the proximal phalanx of the great toe. Moderate plantar and mild posterior calcaneal heel spurs. Moderate dorsal tarsometatarsal degenerative osteophytes on lateral view, likely of the first tarsometatarsal joint. Mild subchondral sclerosis and joint space narrowing of the first tarsometatarsal joint on frontal view. No acute fracture or dislocation. IMPRESSION: 1. Moderate plantar and mild posterior calcaneal heel spurs. 2. Moderate first tarsometatarsal osteoarthritis. Electronically Signed   By: Neita Garnet M.D.   On: 03/27/2023 21:26     Independent interpretation of notes and tests performed by another provider:   None  Procedures performed:   None  Pertinent History, Exam, Impression, and Recommendations:   [name] was seen today for osteoarthritis of right midfoot.  Osteoarthritis of right midfoot Assessment & Plan: Symptoms resolving / resolved following prednisone and CAM boot, x-rays and clinical course most consistent with OA flare. X-rays independently interpreted and reviewed with patient.  - Transition from CAM boot to standard shoes - Use topical diclofenac PRN - Start home exercises - Follow-up  as-needed      Orders & Medications No orders of the defined types were placed in this encounter.  No orders of the defined types were placed in this encounter.    No follow-ups on file.     Jerrol Banana, MD, [facility]   Primary Care Sports Medicine Primary Care and Sports Medicine at [facility]

## 2023-05-01 ENCOUNTER — Ambulatory Visit (INDEPENDENT_AMBULATORY_CARE_PROVIDER_SITE_OTHER): Payer: Medicare HMO | Admitting: Family Medicine

## 2023-05-01 ENCOUNTER — Encounter: Payer: Self-pay | Admitting: Family Medicine

## 2023-05-01 VITALS — BP 102/78 | HR 64 | Ht 71.0 in | Wt 246.0 lb

## 2023-05-01 DIAGNOSIS — Z7984 Long term (current) use of oral hypoglycemic drugs: Secondary | ICD-10-CM | POA: Diagnosis not present

## 2023-05-01 DIAGNOSIS — E119 Type 2 diabetes mellitus without complications: Secondary | ICD-10-CM

## 2023-05-01 MED ORDER — GLIPIZIDE ER 2.5 MG PO TB24
2.5000 mg | ORAL_TABLET | Freq: Every day | ORAL | 1 refills | Status: DC
Start: 2023-05-01 — End: 2023-09-10

## 2023-05-01 MED ORDER — METFORMIN HCL 500 MG PO TABS: ORAL_TABLET | ORAL | 0 refills | Status: DC

## 2023-05-01 MED ORDER — GLIPIZIDE ER 2.5 MG PO TB24: 2.5000 mg | ORAL_TABLET | Freq: Every day | ORAL | 0 refills | Status: DC

## 2023-05-01 MED ORDER — METFORMIN HCL 500 MG PO TABS: ORAL_TABLET | ORAL | 1 refills | Status: DC

## 2023-05-01 NOTE — Progress Notes (Addendum)
Date:  05/01/2023   Name:  Gary Kramer   DOB:  Feb 14, 1946   MRN:  [id number]   Chief Complaint: Prediabetes  Diabetes He presents for his follow-up diabetic visit. He has type 2 diabetes mellitus. His disease course has been stable. There are no hypoglycemic associated symptoms. There are no diabetic associated symptoms. Pertinent negatives for diabetes include no blurred vision, no chest pain, no fatigue, no foot paresthesias, no foot ulcerations, no polydipsia, no polyuria, no visual change, no weakness and no weight loss. There are no hypoglycemic complications. Symptoms are stable. There are no diabetic complications. There are no known risk factors for coronary artery disease. Current diabetic treatment includes oral agent (dual therapy). He is compliant with treatment all of the time. He is following a generally healthy diet. Meal planning includes avoidance of concentrated sweets and carbohydrate counting. There is no change in his home blood glucose trend. An ACE inhibitor/angiotensin II receptor blocker is being taken.    Lab Results  Component Value Date   NA 139 01/03/2023   K 4.7 01/03/2023   CO2 22 01/03/2023   GLUCOSE 91 01/03/2023   BUN 29 (H) 01/03/2023   CREATININE 1.57 (H) 01/03/2023   CALCIUM 9.9 01/03/2023   EGFR 46 (L) 01/03/2023   GFRNONAA 52 (L) 01/06/2021   Lab Results  Component Value Date   CHOL 141 01/03/2023   HDL 34 (L) 01/03/2023   LDLCALC 77 01/03/2023   TRIG 174 (H) 01/03/2023   CHOLHDL 4.1 07/16/2018   No results found for: "TSH" Lab Results  Component Value Date   HGBA1C 6.1 (H) 01/03/2023   Lab Results  Component Value Date   WBC 11.3 (H) 04/04/2018   HGB 9.7 (L) 04/04/2018   HCT 28.5 (L) 04/04/2018   MCV 85.9 04/04/2018   PLT 264 04/04/2018   Lab Results  Component Value Date   ALT 18 07/03/2022   AST 18 07/03/2022   ALKPHOS 108 07/03/2022   BILITOT 0.2 07/03/2022   No results found for: "25OHVITD2", "25OHVITD3", "VD25OH"    Review of Systems  Constitutional:  Negative for fatigue and weight loss.  HENT:  Negative for congestion.   Eyes:  Negative for blurred vision.  Respiratory:  Negative for cough, choking, chest tightness, shortness of breath and wheezing.   Cardiovascular:  Negative for chest pain, palpitations and leg swelling.  Gastrointestinal:  Negative for abdominal pain.  Endocrine: Negative for polydipsia and polyuria.  Neurological:  Negative for weakness.    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   Presence of left artificial knee joint 01/24/2018   Recurrent major depressive disorder, in partial remission (HCC) 01/16/2018   Primary osteoarthritis of left knee 12/26/2017   Chronic allergic rhinitis 01/05/2017   Essential hypertension 07/20/2015   Hyperlipidemia 07/20/2015   Erectile dysfunction 07/20/2015   Depression 07/20/2015   Allergic rhinitis due to pollen 07/20/2015   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     Past Surgical History:  Procedure Laterality Date   COLONOSCOPY  2016   cleared for 5 yrs- Dr Bluford Kaufmann   COLONOSCOPY WITH PROPOFOL N/A 03/10/2020   Procedure: COLONOSCOPY WITH PROPOFOL;  Surgeon: Wyline Mood, MD;  Location: [facility] ENDOSCOPY;   Service: Gastroenterology;  Laterality: N/A;   HERNIA REPAIR     JOINT REPLACEMENT Left 12/2017   TKR   JOINT REPLACEMENT Right 03/2018   Hip   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   Procedure: TOTAL HIP ARTHROPLASTY;  Surgeon: Christena Flake, MD;  Location: [facility] ORS;  Service: Orthopedics;  Laterality: Right;    Social History   Tobacco Use   Smoking status: Former    Packs/day: 1.50    Years: 30.00    Additional pack years: 0.00    Total pack years: 45.00    Types: Cigarettes    Quit date: 1996    Years since quitting: 28.4   Smokeless tobacco: Never   Tobacco comments:    smoking cessatioinn materials not required  Vaping Use   Vaping Use: Never used  Substance Use Topics   Alcohol use: Yes    Alcohol/week: 0.0 standard drinks of alcohol    Comment: only occasionally   Drug use: No     Medication list has been reviewed and updated.  Current Meds  Medication Sig   amLODipine-benazepril (LOTREL) 10-20 MG capsule Take 1 capsule by mouth daily   Ascorbic Acid (VITAMIN C) 100 MG tablet Take 1,000 mg by mouth daily.    aspirin EC 81 MG tablet Take 81 mg by mouth daily.    atorvastatin (LIPITOR) 20 MG tablet One a day   blood glucose meter kit and supplies KIT Dispense based on patient and insurance preference. Use up to four times daily as directed.   carvedilol (COREG) 6.25 MG tablet Take 1 tablet by mouth twice daily   cholecalciferol (VITAMIN D) 400 units TABS tablet Take 1,000 Units by mouth daily.    diclofenac Sodium (VOLTAREN) 1 % GEL Apply topically. PRN   fenofibrate (TRICOR) 145 MG tablet TAKE ONE TABLET BY MOUTH AT 6 IN THE MORNING   fluticasone (FLONASE) 50 MCG/ACT nasal spray Place 1 spray into both nostrils daily as needed (for sinus/allergy issues.).   Garlic 705 MG CAPS Take 1,000 mg by mouth daily.    glipiZIDE (GLUCOTROL XL) 2.5 MG 24 hr tablet Take 1 tablet (2.5 mg total) by mouth daily with breakfast.   glucose blood (ONETOUCH ULTRA) test strip Use  up to 4 times daily as directed   hydrochlorothiazide (MICROZIDE) 12.5 MG capsule TAKE (1) CAPSULE BY MOUTH EVERY DAY AT 1LK THE MORNING   loratadine (ALLERGY RELIEF) 10 MG tablet TAKE (1) TABLET BY MOUTH EVERY DAY   metFORMIN (GLUCOPHAGE) 500 MG tablet TAKE (1) TABLET BY MOUTH TWICE DAILY WITH A MEAL.   Multiple Vitamin (MULTI-VITAMINS) TABS Take 1 tablet by mouth daily at 6 (six) AM.   Omega-3 Fatty Acids (FISH OIL MAXIMUM STRENGTH) 1200 MG CPDR    sertraline (ZOLOFT) 50 MG tablet Take 1 tablet (50 mg total) by mouth daily at 6 (six) AM.       05/01/2023   10:12 AM 01/03/2023   10:57 AM 10/04/2022   10:20 AM 09/27/2022   11:37 AM  GAD 7 : Generalized Anxiety  Score  Nervous, Anxious, on Edge 0 0 0 0  Control/stop worrying 0 0 0 0  Worry too much - different things 0 0 0 0  Trouble relaxing 0 0 0 0  Restless 0 0 0 0  Easily annoyed or irritable 0 0 0 0  Afraid - awful might happen 0 0 0 0  Total GAD 7 Score 0 0 0 0  Anxiety Difficulty Not difficult at all Not difficult at all Not difficult at all Not difficult at all       05/01/2023   10:12 AM 01/17/2023    2:51 PM 01/03/2023   10:57 AM  Depression screen PHQ 2/9  Decreased Interest 0 0 0  Down, Depressed, Hopeless 0 0 0  PHQ - 2 Score 0 0 0  Altered sleeping 0 0 0  Tired, decreased energy 0 0 0  Change in appetite 0 0 0  Feeling bad or failure about yourself  0 0 0  Trouble concentrating 0 0 0  Moving slowly or fidgety/restless 0 0 0  Suicidal thoughts 0 0 0  PHQ-9 Score 0 0 0  Difficult doing work/chores Not difficult at all Not difficult at all Not difficult at all    BP Readings from Last 3 Encounters:  05/01/23 102/78  03/23/23 134/78  01/17/23 132/70    Physical Exam Vitals and nursing note reviewed.  HENT:     Head: Normocephalic.     Right Ear: Tympanic membrane and external ear normal.     Left Ear: Tympanic membrane and external ear normal.     Nose: Nose normal. No congestion or rhinorrhea.      Mouth/Throat:     Mouth: Mucous membranes are moist.     Pharynx: No oropharyngeal exudate or posterior oropharyngeal erythema.  Eyes:     General: No scleral icterus.       Right eye: No discharge.        Left eye: No discharge.     Conjunctiva/sclera: Conjunctivae normal.     Pupils: Pupils are equal, round, and reactive to light.  Neck:     Thyroid: No thyromegaly.     Vascular: No JVD.     Trachea: No tracheal deviation.  Cardiovascular:     Rate and Rhythm: Normal rate and regular rhythm.     Heart sounds: Normal heart sounds. No murmur heard.    No friction rub. No gallop.  Pulmonary:     Effort: No respiratory distress.     Breath sounds: Normal breath sounds. No wheezing, rhonchi or rales.  Abdominal:     General: Bowel sounds are normal.     Palpations: Abdomen is soft. There is no mass.     Tenderness: There is no abdominal tenderness. There is no guarding or rebound.  Musculoskeletal:        General: No tenderness. Normal range of motion.     Cervical back: Normal range of motion and neck supple.  Lymphadenopathy:     Cervical: No cervical adenopathy.  Skin:    General: Skin is warm.     Findings: No lesion or rash.  Neurological:     Mental Status: He is alert and oriented to person, place, and time.     Cranial Nerves: No cranial nerve deficit.     Deep Tendon Reflexes: Reflexes are normal and symmetric.     Wt Readings from Last 3 Encounters:  05/01/23 246 lb (111.6 kg)  03/30/23 242 lb (109.8 kg)  03/23/23  243 lb (110.2 kg)    BP 102/78   Pulse 64   Ht 5\' 11"  (1.803 m)   Wt 246 lb (111.6 kg)   SpO2 94%   BMI 34.31 kg/m   Assessment and Plan: 1. Diabetes mellitus treated with oral medication (HCC) Chronic.  Controlled.  Stable.  Continue glipizide XL 2.5 mg once a day.  We will also continue metformin 500 mg twice a day.  Will check A1c for current level of A1c for control. - glipiZIDE (GLUCOTROL XL) 2.5 MG 24 hr tablet; Take 1 tablet (2.5 mg  total) by mouth daily with breakfast.  Dispense: 90 tablet; Refill: 1 - metFORMIN (GLUCOPHAGE) 500 MG tablet; TAKE (1) TABLET BY MOUTH TWICE DAILY WITH A MEAL.  Dispense: 180 tablet; Refill: 1 - HgB A1c     Elizabeth Sauer, MD

## 2023-05-01 NOTE — Addendum Note (Signed)
Addended by: Duanne Limerick on: 05/01/2023 01:28 PM   Modules accepted: Orders

## 2023-05-02 LAB — HEMOGLOBIN A1C
Est. average glucose Bld gHb Est-mCnc: 140 mg/dL
Hgb A1c MFr Bld: 6.5 % — ABNORMAL HIGH (ref 4.8–5.6)

## 2023-05-04 ENCOUNTER — Ambulatory Visit: Payer: Medicare HMO | Admitting: Family Medicine

## 2023-06-05 DIAGNOSIS — F419 Anxiety disorder, unspecified: Secondary | ICD-10-CM | POA: Diagnosis not present

## 2023-06-05 DIAGNOSIS — I129 Hypertensive chronic kidney disease with stage 1 through stage 4 chronic kidney disease, or unspecified chronic kidney disease: Secondary | ICD-10-CM | POA: Diagnosis not present

## 2023-06-05 DIAGNOSIS — E1122 Type 2 diabetes mellitus with diabetic chronic kidney disease: Secondary | ICD-10-CM | POA: Diagnosis not present

## 2023-06-05 DIAGNOSIS — Z8249 Family history of ischemic heart disease and other diseases of the circulatory system: Secondary | ICD-10-CM | POA: Diagnosis not present

## 2023-06-05 DIAGNOSIS — E785 Hyperlipidemia, unspecified: Secondary | ICD-10-CM | POA: Diagnosis not present

## 2023-06-05 DIAGNOSIS — N1831 Chronic kidney disease, stage 3a: Secondary | ICD-10-CM | POA: Diagnosis not present

## 2023-06-05 DIAGNOSIS — F325 Major depressive disorder, single episode, in full remission: Secondary | ICD-10-CM | POA: Diagnosis not present

## 2023-06-05 DIAGNOSIS — E1165 Type 2 diabetes mellitus with hyperglycemia: Secondary | ICD-10-CM | POA: Diagnosis not present

## 2023-06-05 DIAGNOSIS — M1990 Unspecified osteoarthritis, unspecified site: Secondary | ICD-10-CM | POA: Diagnosis not present

## 2023-06-05 DIAGNOSIS — E669 Obesity, unspecified: Secondary | ICD-10-CM | POA: Diagnosis not present

## 2023-06-14 DIAGNOSIS — I10 Essential (primary) hypertension: Secondary | ICD-10-CM | POA: Diagnosis not present

## 2023-06-14 DIAGNOSIS — E1122 Type 2 diabetes mellitus with diabetic chronic kidney disease: Secondary | ICD-10-CM | POA: Diagnosis not present

## 2023-06-14 DIAGNOSIS — N1831 Chronic kidney disease, stage 3a: Secondary | ICD-10-CM | POA: Diagnosis not present

## 2023-06-26 ENCOUNTER — Other Ambulatory Visit: Payer: Self-pay | Admitting: Family Medicine

## 2023-06-26 DIAGNOSIS — I10 Essential (primary) hypertension: Secondary | ICD-10-CM

## 2023-06-27 NOTE — Telephone Encounter (Signed)
Requested Prescriptions  Pending Prescriptions Disp Refills   amLODipine-benazepril (LOTREL) 10-20 MG capsule [Pharmacy Med Name: AMLOD/BENAZP CAP 10-20MG ] 90 capsule 1    Sig: TAKE (1) CAPSULE BY MOUTH EVERY DAY     Cardiovascular: CCB + ACEI Combos Failed - 06/26/2023  3:56 PM      Failed - Cr in normal range and within 180 days    Creatinine, Ser  Date Value Ref Range Status  01/03/2023 1.57 (H) 0.76 - 1.27 mg/dL Final         Passed - K in normal range and within 180 days    Potassium  Date Value Ref Range Status  01/03/2023 4.7 3.5 - 5.2 mmol/L Final         Passed - Na in normal range and within 180 days    Sodium  Date Value Ref Range Status  01/03/2023 139 134 - 144 mmol/L Final         Passed - eGFR is 30 or above and within 180 days    GFR calc Af Amer  Date Value Ref Range Status  01/06/2021 60 >59 mL/min/1.73 Final    Comment:    **In accordance with recommendations from the NKF-ASN Task force,**   [facility] is in the process of updating its eGFR calculation to the   2021 CKD-EPI creatinine equation that estimates kidney function   without a race variable.    GFR calc non Af Amer  Date Value Ref Range Status  01/06/2021 52 (L) >59 mL/min/1.73 Final   eGFR  Date Value Ref Range Status  01/03/2023 46 (L) >59 mL/min/1.73 Final         Passed - Patient is not pregnant      Passed - Last BP in normal range    BP Readings from Last 1 Encounters:  05/01/23 102/78         Passed - Valid encounter within last 6 months    Recent Outpatient Visits           1 month ago Diabetes mellitus treated with oral medication (HCC)   [place] [facility]   2 months ago Osteoarthritis of right midfoot   [place] [facility] Ocie Bob, MD   3 months ago Osteoarthritis of right midfoot   [facility],  Ocie Bob, MD   5 months ago Essential hypertension   [place] [facility]   8 months ago New onset type 2 diabetes mellitus [facility])   [place] [facility]       Future Appointments             In 2 months Duanne Limerick, MD [facility] at [place], [facility]

## 2023-07-23 ENCOUNTER — Other Ambulatory Visit: Payer: Self-pay

## 2023-07-23 ENCOUNTER — Other Ambulatory Visit: Payer: Self-pay | Admitting: Family Medicine

## 2023-07-23 DIAGNOSIS — I10 Essential (primary) hypertension: Secondary | ICD-10-CM

## 2023-07-23 MED ORDER — HYDROCHLOROTHIAZIDE 12.5 MG PO CAPS
ORAL_CAPSULE | ORAL | 0 refills | Status: DC
Start: 2023-07-23 — End: 2023-09-10

## 2023-08-07 ENCOUNTER — Other Ambulatory Visit: Payer: Self-pay | Admitting: Family Medicine

## 2023-08-07 DIAGNOSIS — E785 Hyperlipidemia, unspecified: Secondary | ICD-10-CM

## 2023-08-09 NOTE — Telephone Encounter (Signed)
Requested medication (s) are due for refill today:   Yes for both  Requested medication (s) are on the active medication list:   Yes for both  Future visit scheduled:   Yes 10/4   Last ordered: Both 01/03/2023 #90, 1 refill  Returned because labs are due.   Requested Prescriptions  Pending Prescriptions Disp Refills   fenofibrate (TRICOR) 145 MG tablet [Pharmacy Med Name: FENOFIBRATE TAB 145MG ] 90 tablet 1    Sig: TAKE ONE TABLET BY MOUTH ONCE DAILY AT 6:00 IN THE MORNING.     Cardiovascular:  Antilipid - Fibric Acid Derivatives Failed - 08/07/2023  2:51 PM      Failed - ALT in normal range and within 360 days    ALT  Date Value Ref Range Status  07/03/2022 18 0 - 44 IU/L Final         Failed - AST in normal range and within 360 days    AST  Date Value Ref Range Status  07/03/2022 18 0 - 40 IU/L Final         Failed - Cr in normal range and within 360 days    Creatinine, Ser  Date Value Ref Range Status  01/03/2023 1.57 (H) 0.76 - 1.27 mg/dL Final         Failed - HGB in normal range and within 360 days    Hemoglobin  Date Value Ref Range Status  04/04/2018 9.7 (L) 13.0 - 18.0 g/dL Final  54/07/8118 14.7 13.0 - 17.7 g/dL Final         Failed - HCT in normal range and within 360 days    HCT  Date Value Ref Range Status  04/04/2018 28.5 (L) 40.0 - 52.0 % Final         Failed - PLT in normal range and within 360 days    Platelets  Date Value Ref Range Status  04/04/2018 264 150 - 440 K/uL Final    Comment:    Performed at [facility], [address] [place], [place] [address]         Failed - WBC in normal range and within 360 days    WBC  Date Value Ref Range Status  04/04/2018 11.3 (H) 3.8 - 10.6 K/uL Final         Failed - Lipid Panel in normal range within the last 12 months    Cholesterol, Total  Date Value Ref Range Status  01/03/2023 141 100 - 199 mg/dL Final   LDL Chol Calc (NIH)  Date Value Ref Range Status  01/03/2023 77 0 - 99  mg/dL Final   HDL  Date Value Ref Range Status  01/03/2023 34 (L) >39 mg/dL Final   Triglycerides  Date Value Ref Range Status  01/03/2023 174 (H) 0 - 149 mg/dL Final         Passed - eGFR is 30 or above and within 360 days    GFR calc Af Amer  Date Value Ref Range Status  01/06/2021 60 >59 mL/min/1.73 Final    Comment:    **In accordance with recommendations from the NKF-ASN Task force,**   [facility] is in the process of updating its eGFR calculation to the   2021 CKD-EPI creatinine equation that estimates kidney function   without a race variable.    GFR calc non Af Amer  Date Value Ref Range Status  01/06/2021 52 (L) >59 mL/min/1.73 Final   eGFR  Date Value Ref Range Status  01/03/2023  46 (L) >59 mL/min/1.73 Final         Passed - Valid encounter within last 12 months    Recent Outpatient Visits           3 months ago Diabetes mellitus treated with oral medication (HCC)   [place] [facility]   4 months ago Osteoarthritis of right midfoot   [place] [facility], Ocie Bob, MD   4 months ago Osteoarthritis of right midfoot   [place] [facility], Ocie Bob, MD   7 months ago Essential hypertension   [place] [facility]   10 months ago New onset type 2 diabetes mellitus [facility])   [place] [facility]       Future Appointments             In 3 weeks Duanne Limerick, MD [facility] at [facility], [facility]             atorvastatin (LIPITOR) 20 MG tablet [Pharmacy Med Name: ATORVASTATIN TAB 20MG ] 90 tablet 1    Sig: TAKE (1) TABLET BY MOUTH EVERY DAY     Cardiovascular:  Antilipid - Statins Failed - 08/07/2023  2:51 PM      Failed - Lipid Panel in  normal range within the last 12 months    Cholesterol, Total  Date Value Ref Range Status  01/03/2023 141 100 - 199 mg/dL Final   LDL Chol Calc (NIH)  Date Value Ref Range Status  01/03/2023 77 0 - 99 mg/dL Final   HDL  Date Value Ref Range Status  01/03/2023 34 (L) >39 mg/dL Final   Triglycerides  Date Value Ref Range Status  01/03/2023 174 (H) 0 - 149 mg/dL Final         Passed - Patient is not pregnant      Passed - Valid encounter within last 12 months    Recent Outpatient Visits           3 months ago Diabetes mellitus treated with oral medication (HCC)   [place] [facility]   4 months ago Osteoarthritis of right midfoot   [place] [facility], Ocie Bob, MD   4 months ago Osteoarthritis of right midfoot   [facility], Ocie Bob, MD   7 months ago Essential hypertension   [place] [facility]   10 months ago New onset type 2 diabetes mellitus [facility])    [facility] at [facility]       Future Appointments             In 3 weeks Duanne Limerick, MD [facility] at [facility], [facility]

## 2023-08-13 ENCOUNTER — Other Ambulatory Visit: Payer: Self-pay

## 2023-08-13 DIAGNOSIS — E785 Hyperlipidemia, unspecified: Secondary | ICD-10-CM

## 2023-08-13 DIAGNOSIS — F3341 Major depressive disorder, recurrent, in partial remission: Secondary | ICD-10-CM

## 2023-08-13 MED ORDER — SERTRALINE HCL 50 MG PO TABS: 50.0000 mg | ORAL_TABLET | Freq: Every day | ORAL | 0 refills | Status: DC

## 2023-08-13 MED ORDER — FENOFIBRATE 145 MG PO TABS
ORAL_TABLET | ORAL | 0 refills | Status: DC
Start: 2023-08-13 — End: 2023-09-10

## 2023-08-13 MED ORDER — ATORVASTATIN CALCIUM 20 MG PO TABS: ORAL_TABLET | ORAL | 0 refills | Status: DC

## 2023-08-31 ENCOUNTER — Ambulatory Visit: Payer: Medicare HMO | Admitting: Family Medicine

## 2023-09-10 ENCOUNTER — Encounter: Payer: Self-pay | Admitting: Family Medicine

## 2023-09-10 ENCOUNTER — Ambulatory Visit (INDEPENDENT_AMBULATORY_CARE_PROVIDER_SITE_OTHER): Payer: Medicare HMO | Admitting: Family Medicine

## 2023-09-10 VITALS — BP 130/72 | HR 71 | Ht 71.0 in | Wt 253.4 lb

## 2023-09-10 DIAGNOSIS — Z23 Encounter for immunization: Secondary | ICD-10-CM

## 2023-09-10 DIAGNOSIS — I10 Essential (primary) hypertension: Secondary | ICD-10-CM

## 2023-09-10 DIAGNOSIS — J301 Allergic rhinitis due to pollen: Secondary | ICD-10-CM | POA: Diagnosis not present

## 2023-09-10 DIAGNOSIS — E119 Type 2 diabetes mellitus without complications: Secondary | ICD-10-CM

## 2023-09-10 DIAGNOSIS — Z7984 Long term (current) use of oral hypoglycemic drugs: Secondary | ICD-10-CM | POA: Diagnosis not present

## 2023-09-10 DIAGNOSIS — F3341 Major depressive disorder, recurrent, in partial remission: Secondary | ICD-10-CM

## 2023-09-10 DIAGNOSIS — E785 Hyperlipidemia, unspecified: Secondary | ICD-10-CM

## 2023-09-10 MED ORDER — METFORMIN HCL 500 MG PO TABS: ORAL_TABLET | ORAL | 1 refills | Status: AC

## 2023-09-10 MED ORDER — GLIPIZIDE ER 2.5 MG PO TB24: 2.5000 mg | ORAL_TABLET | Freq: Every day | ORAL | 1 refills | Status: DC

## 2023-09-10 MED ORDER — HYDROCHLOROTHIAZIDE 12.5 MG PO CAPS: ORAL_CAPSULE | ORAL | 1 refills | Status: AC

## 2023-09-10 MED ORDER — FLUTICASONE PROPIONATE 50 MCG/ACT NA SUSP
1.0000 | Freq: Every day | NASAL | 5 refills | Status: AC | PRN
Start: 2023-09-10 — End: ?

## 2023-09-10 MED ORDER — AMLODIPINE BESY-BENAZEPRIL HCL 10-20 MG PO CAPS
ORAL_CAPSULE | ORAL | 1 refills | Status: DC
Start: 2023-09-10 — End: 2024-01-15

## 2023-09-10 MED ORDER — CARVEDILOL 6.25 MG PO TABS
ORAL_TABLET | ORAL | 1 refills | Status: AC
Start: 2023-09-10 — End: ?

## 2023-09-10 MED ORDER — FENOFIBRATE 145 MG PO TABS
ORAL_TABLET | ORAL | 1 refills | Status: AC
Start: 2023-09-10 — End: ?

## 2023-09-10 MED ORDER — ATORVASTATIN CALCIUM 20 MG PO TABS
ORAL_TABLET | ORAL | 1 refills | Status: AC
Start: 2023-09-10 — End: ?

## 2023-09-10 MED ORDER — LORATADINE 10 MG PO TABS
ORAL_TABLET | ORAL | 1 refills | Status: DC
Start: 2023-09-10 — End: 2024-02-14

## 2023-09-10 MED ORDER — SERTRALINE HCL 50 MG PO TABS: 50.0000 mg | ORAL_TABLET | Freq: Every day | ORAL | 1 refills | Status: AC

## 2023-09-10 NOTE — Progress Notes (Signed)
Date:  09/10/2023   Name:  Gary Kramer   DOB:  November 04, 1946   MRN:  [id number]   Chief Complaint: Hypertension and Medication Refill  Hypertension This is a chronic problem. The current episode started more than 1 year ago. The problem has been gradually improving since onset. The problem is controlled. Pertinent negatives include no anxiety, blurred vision, chest pain, headaches, malaise/fatigue, neck pain, orthopnea, palpitations, peripheral edema, PND, shortness of breath or sweats. Risk factors for coronary artery disease include dyslipidemia. The current treatment provides moderate improvement. There are no compliance problems.  There is no history of chronic renal disease.  Medication Refill Pertinent negatives include no abdominal pain, chest pain, fatigue, headaches, myalgias or neck pain.  Diabetes He presents for his follow-up diabetic visit. Pertinent negatives for hypoglycemia include no confusion, dizziness, headaches, nervousness/anxiousness, seizures, sleepiness, sweats or tremors. Pertinent negatives for diabetes include no blurred vision, no chest pain, no fatigue, no polydipsia and no polyuria.  Hyperlipidemia This is a chronic problem. The current episode started more than 1 year ago. The problem is controlled. Recent lipid tests were reviewed and are normal. He has no history of chronic renal disease. Pertinent negatives include no chest pain, myalgias or shortness of breath. Current antihyperlipidemic treatment includes statins and fibric acid derivatives. The current treatment provides moderate improvement of lipids. There are no compliance problems.   Depression        This is a chronic problem.  The problem has been gradually improving since onset.  Associated symptoms include no decreased concentration, no fatigue, no helplessness, no hopelessness, does not have insomnia, not irritable, no restlessness, no decreased interest, no appetite change, no body aches, no  myalgias, no headaches, no indigestion, not sad and no suicidal ideas.  Past treatments include SSRIs - Selective serotonin reuptake inhibitors.   Pertinent negatives include no anxiety.   Lab Results  Component Value Date   NA 139 01/03/2023   K 4.7 01/03/2023   CO2 22 01/03/2023   GLUCOSE 91 01/03/2023   BUN 29 (H) 01/03/2023   CREATININE 1.57 (H) 01/03/2023   CALCIUM 9.9 01/03/2023   EGFR 46 (L) 01/03/2023   GFRNONAA 52 (L) 01/06/2021   Lab Results  Component Value Date   CHOL 141 01/03/2023   HDL 34 (L) 01/03/2023   LDLCALC 77 01/03/2023   TRIG 174 (H) 01/03/2023   CHOLHDL 4.1 07/16/2018   No results found for: "TSH" Lab Results  Component Value Date   HGBA1C 6.5 (H) 05/01/2023   Lab Results  Component Value Date   WBC 11.3 (H) 04/04/2018   HGB 9.7 (L) 04/04/2018   HCT 28.5 (L) 04/04/2018   MCV 85.9 04/04/2018   PLT 264 04/04/2018   Lab Results  Component Value Date   ALT 18 07/03/2022   AST 18 07/03/2022   ALKPHOS 108 07/03/2022   BILITOT 0.2 07/03/2022   No results found for: "25OHVITD2", "25OHVITD3", "VD25OH"   Review of Systems  Constitutional:  Negative for appetite change, fatigue, malaise/fatigue and unexpected weight change.  Eyes:  Negative for blurred vision, redness and visual disturbance.  Respiratory:  Negative for apnea, choking, chest tightness, shortness of breath and wheezing.   Cardiovascular:  Negative for chest pain, palpitations, orthopnea and PND.  Gastrointestinal:  Negative for abdominal distention, abdominal pain, anal bleeding and blood in stool.  Endocrine: Negative for polydipsia and polyuria.  Musculoskeletal:  Negative for myalgias and neck pain.  Neurological:  Negative for dizziness, tremors, seizures and  headaches.  Psychiatric/Behavioral:  Positive for depression. Negative for confusion, decreased concentration and suicidal ideas. The patient is not nervous/anxious and does not have insomnia.     Patient Active Problem  List   Diagnosis Date Noted   Osteoarthritis of right midfoot 03/23/2023   Stage 3 chronic kidney disease (HCC) 09/02/2019   Obesity (BMI 30.0-34.9) 06/06/2018   Status post total hip replacement, right 04/02/2018   Presence of left artificial knee joint 01/24/2018   Recurrent major depressive disorder, in partial remission (HCC) 01/16/2018   Primary osteoarthritis of left knee 12/26/2017   Chronic allergic rhinitis 01/05/2017   Essential hypertension 07/20/2015   Hyperlipidemia 07/20/2015   Erectile dysfunction 07/20/2015   Depression 07/20/2015   Allergic rhinitis due to pollen 07/20/2015   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     Past Surgical History:  Procedure Laterality Date   COLONOSCOPY  2016   cleared for 5 yrs- Dr Bluford Kaufmann   COLONOSCOPY WITH PROPOFOL N/A 03/10/2020   Procedure: COLONOSCOPY WITH PROPOFOL;  Surgeon: Wyline Mood, MD;  Location: [facility] ENDOSCOPY;  Service: Gastroenterology;  Laterality: N/A;   HERNIA REPAIR     JOINT REPLACEMENT Left 12/2017   TKR   JOINT REPLACEMENT Right 03/2018   Hip   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   Procedure: TOTAL HIP ARTHROPLASTY;  Surgeon: Christena Flake, MD;  Location: [facility] ORS;  Service: Orthopedics;  Laterality: Right;    Social History   Tobacco Use   Smoking status: Former    Current packs/day: 0.00    Average packs/day: 1.5 packs/day for 30.0 years (45.0 ttl pk-yrs)    Types:  Cigarettes    Start date: 36    Quit date: 73    Years since quitting: 28.8   Smokeless tobacco: Never   Tobacco comments:    smoking cessatioinn materials not required  Vaping Use   Vaping status: Never Used  Substance Use Topics   Alcohol use: Yes    Alcohol/week: 0.0 standard drinks of alcohol    Comment: only occasionally   Drug use: No     Medication list has been reviewed and updated.  Current Meds  Medication Sig   amLODipine-benazepril (LOTREL) 10-20 MG capsule TAKE (1) CAPSULE BY MOUTH EVERY DAY   Ascorbic Acid (VITAMIN C) 100 MG tablet Take 1,000 mg by mouth daily.    aspirin EC 81 MG tablet Take 81 mg by mouth daily.    atorvastatin (LIPITOR) 20 MG tablet TAKE (1) TABLET BY MOUTH EVERY DAY   blood glucose meter kit and supplies KIT Dispense based on patient and insurance preference. Use up to four times daily as directed.   carvedilol (COREG) 6.25 MG tablet Take 1 tablet by mouth twice daily   cholecalciferol (VITAMIN D) 400 units TABS tablet Take 1,000 Units by mouth daily.    diclofenac Sodium (VOLTAREN) 1 % GEL Apply topically. PRN   fenofibrate (TRICOR) 145 MG tablet TAKE ONE TABLET BY MOUTH ONCE DAILY  AT 6:00 IN THE MORNING.   fluticasone (FLONASE) 50 MCG/ACT nasal spray Place 1 spray into both nostrils daily as needed (for sinus/allergy issues.).   Garlic 705 MG CAPS Take 1,000 mg by mouth daily.    glipiZIDE (GLUCOTROL XL) 2.5 MG 24 hr tablet Take 1 tablet (2.5 mg total) by mouth daily with breakfast.   glucose blood (ONETOUCH ULTRA) test strip Use up to 4 times daily as directed   hydrochlorothiazide (MICROZIDE) 12.5 MG capsule TAKE (1) CAPSULE BY MOUTH EVERY DAY AT 5WU THE MORNING   loratadine (ALLERGY RELIEF) 10 MG tablet TAKE (1) TABLET BY MOUTH EVERY DAY   metFORMIN (GLUCOPHAGE) 500 MG tablet TAKE (1) TABLET BY MOUTH TWICE DAILY WITH A MEAL.   Multiple Vitamin (MULTI-VITAMINS) TABS Take 1 tablet by mouth daily at 6 (six) AM.   Omega-3 Fatty Acids (FISH  OIL MAXIMUM STRENGTH) 1200 MG CPDR    sertraline (ZOLOFT) 50 MG tablet Take 1 tablet (50 mg total) by mouth daily at 6 (six) AM.       09/10/2023   10:23 AM 05/01/2023   10:12 AM 01/03/2023   10:57 AM 10/04/2022   10:20 AM  GAD 7 : Generalized Anxiety Score  Nervous, Anxious, on Edge 0 0 0 0  Control/stop worrying 0 0 0 0  Worry too much - different things 0 0 0 0  Trouble relaxing 0 0 0 0  Restless 0 0 0 0  Easily annoyed or irritable 1 0 0 0  Afraid - awful might happen 0 0 0 0  Total GAD 7 Score 1 0 0 0  Anxiety Difficulty Not difficult at all Not difficult at all Not difficult at all Not difficult at all       09/10/2023   10:23 AM 05/01/2023   10:12 AM 01/17/2023    2:51 PM  Depression screen PHQ 2/9  Decreased Interest 0 0 0  Down, Depressed, Hopeless 0 0 0  PHQ - 2 Score 0 0 0  Altered sleeping 0 0 0  Tired, decreased energy 1 0 0  Change in appetite 0 0 0  Feeling bad or failure about yourself  0 0 0  Trouble concentrating 0 0 0  Moving slowly or fidgety/restless 0 0 0  Suicidal thoughts 0 0 0  PHQ-9 Score 1 0 0  Difficult doing work/chores Not difficult at all Not difficult at all Not difficult at all    BP Readings from Last 3 Encounters:  09/10/23 130/72  05/01/23 102/78  03/23/23 134/78    Physical Exam Vitals and nursing note reviewed.  Constitutional:      General: He is not irritable. HENT:     Head: Normocephalic.     Right Ear: Tympanic membrane and external ear normal.     Left Ear: Tympanic membrane and external ear normal.     Nose: Nose normal. No congestion or rhinorrhea.     Mouth/Throat:     Mouth: Mucous membranes are moist.     Pharynx: No oropharyngeal exudate.  Eyes:     General: No scleral icterus.       Right eye: No discharge.        Left eye: No discharge.     Conjunctiva/sclera: Conjunctivae normal.     Pupils: Pupils are equal, round, and reactive to light.  Neck:     Thyroid: No thyromegaly.     Vascular: No JVD.      Trachea: No tracheal deviation.  Cardiovascular:  Rate and Rhythm: Normal rate and regular rhythm.     Heart sounds: Normal heart sounds. No murmur heard.    No friction rub. No gallop.  Pulmonary:     Effort: No respiratory distress.     Breath sounds: Normal breath sounds. No wheezing, rhonchi or rales.  Chest:     Chest wall: No tenderness.  Abdominal:     General: Bowel sounds are normal.     Palpations: Abdomen is soft. There is no mass.     Tenderness: There is no abdominal tenderness. There is no guarding or rebound.  Musculoskeletal:        General: No tenderness. Normal range of motion.     Cervical back: Normal range of motion and neck supple.  Lymphadenopathy:     Cervical: No cervical adenopathy.  Skin:    General: Skin is warm.     Findings: No rash.  Neurological:     Mental Status: He is alert and oriented to person, place, and time.     Cranial Nerves: No cranial nerve deficit.     Wt Readings from Last 3 Encounters:  09/10/23 253 lb 6.4 oz (114.9 kg)  05/01/23 246 lb (111.6 kg)  03/30/23 242 lb (109.8 kg)    BP 130/72 (BP Location: Left Arm, Patient Position: Sitting, Cuff Size: Large)   Pulse 71   Ht 5\' 11"  (1.803 m)   Wt 253 lb 6.4 oz (114.9 kg)   SpO2 93%   BMI 35.34 kg/m   Assessment and Plan:  1. Essential hypertension Chronic.  Controlled.  Stable.  Blood pressure today is 130/72.  Asymptomatic.  Tolerating medication well.  Continue amlodipine benazepril 10-to 20 mg once a day, carvedilol 6.25 mg twice a day and hydrochlorothiazide 12.5 mg once a day.  Will check renal function panel for electrolytes and GFR.  Will recheck patient in 6 months. - amLODipine-benazepril (LOTREL) 10-20 MG capsule; TAKE (1) CAPSULE BY MOUTH EVERY DAY  Dispense: 90 capsule; Refill: 1 - carvedilol (COREG) 6.25 MG tablet; Take 1 tablet by mouth twice daily  Dispense: 180 tablet; Refill: 1 - hydrochlorothiazide (MICROZIDE) 12.5 MG capsule; TAKE (1) CAPSULE BY MOUTH  EVERY DAY AT [facility] THE MORNING  Dispense: 90 capsule; Refill: 1 - Renal Function Panel  2. Hyperlipidemia, unspecified hyperlipidemia type Chronic.  Controlled.  Stable.  Continue atorvastatin 20 mg once a day.  Will also continue fenofibrate Tricor 145 mg once a day. - atorvastatin (LIPITOR) 20 MG tablet; TAKE (1) TABLET BY MOUTH EVERY DAY  Dispense: 90 tablet; Refill: 1 - fenofibrate (TRICOR) 145 MG tablet; TAKE ONE TABLET BY MOUTH ONCE DAILY AT 6:00 IN THE MORNING.  Dispense: 90 tablet; Refill: 1  3. Seasonal allergic rhinitis due to pollen Chronic.  Controlled.  Stable.  Continue fluticasone nasal spray 1 spray each nostril once a day and loratadine 10 mg once a day. - fluticasone (FLONASE) 50 MCG/ACT nasal spray; Place 1 spray into both nostrils daily as needed (for sinus/allergy issues.).  Dispense: 16 g; Refill: 5 - loratadine (ALLERGY RELIEF) 10 MG tablet; TAKE (1) TABLET BY MOUTH EVERY DAY  Dispense: 90 tablet; Refill: 1  4. Diabetes mellitus treated with oral medication (HCC) Chronic.  Controlled.  Stable.  Asymptomatic.  Tolerating current medications which include glipizide XL 2.5 mg once a day and metformin 500 mg twice a day.  Will check A1c for current level of control. - glipiZIDE (GLUCOTROL XL) 2.5 MG 24 hr tablet; Take 1 tablet (2.5 mg total)  by mouth daily with breakfast.  Dispense: 90 tablet; Refill: 1 - metFORMIN (GLUCOPHAGE) 500 MG tablet; TAKE (1) TABLET BY MOUTH TWICE DAILY WITH A MEAL.  Dispense: 180 tablet; Refill: 1 - Hemoglobin A1c  5. Recurrent major depressive disorder, in partial remission (HCC) Chronic.  Controlled.  Stable.  PHQ score is 1 and GAD score is 1.  Continue sertraline 50 mg once a day.  Will recheck patient in 6 months. - sertraline (ZOLOFT) 50 MG tablet; Take 1 tablet (50 mg total) by mouth daily at 6 (six) AM.  Dispense: 90 tablet; Refill: 1  6. Encounter for immunization Discussed and administered. - Flu Vaccine Trivalent High Dose (Fluad)     Elizabeth Sauer, MD

## 2023-09-11 LAB — RENAL FUNCTION PANEL
Albumin: 4.2 g/dL (ref 3.8–4.8)
BUN/Creatinine Ratio: 13 (ref 10–24)
BUN: 19 mg/dL (ref 8–27)
CO2: 21 mmol/L (ref 20–29)
Calcium: 9.4 mg/dL (ref 8.6–10.2)
Chloride: 105 mmol/L (ref 96–106)
Creatinine, Ser: 1.49 mg/dL — ABNORMAL HIGH (ref 0.76–1.27)
Glucose: 123 mg/dL — ABNORMAL HIGH (ref 70–99)
Phosphorus: 3.3 mg/dL (ref 2.8–4.1)
Potassium: 4.7 mmol/L (ref 3.5–5.2)
Sodium: 141 mmol/L (ref 134–144)
eGFR: 49 mL/min/{1.73_m2} — ABNORMAL LOW (ref >59)

## 2023-09-11 LAB — HEMOGLOBIN A1C
Est. average glucose Bld gHb Est-mCnc: 143 mg/dL
Hgb A1c MFr Bld: 6.6 % — ABNORMAL HIGH (ref 4.8–5.6)

## 2023-09-13 DIAGNOSIS — Z6835 Body mass index (BMI) 35.0-35.9, adult: Secondary | ICD-10-CM | POA: Diagnosis not present

## 2023-09-13 DIAGNOSIS — Z96652 Presence of left artificial knee joint: Secondary | ICD-10-CM | POA: Diagnosis not present

## 2023-09-13 DIAGNOSIS — E1169 Type 2 diabetes mellitus with other specified complication: Secondary | ICD-10-CM | POA: Diagnosis not present

## 2023-09-13 DIAGNOSIS — M1711 Unilateral primary osteoarthritis, right knee: Secondary | ICD-10-CM | POA: Diagnosis not present

## 2023-10-18 DIAGNOSIS — I10 Essential (primary) hypertension: Secondary | ICD-10-CM | POA: Diagnosis not present

## 2023-10-18 DIAGNOSIS — N1832 Chronic kidney disease, stage 3b: Secondary | ICD-10-CM | POA: Diagnosis not present

## 2023-10-18 DIAGNOSIS — E1122 Type 2 diabetes mellitus with diabetic chronic kidney disease: Secondary | ICD-10-CM | POA: Diagnosis not present

## 2023-11-22 ENCOUNTER — Encounter: Payer: Self-pay | Admitting: Family Medicine

## 2023-11-22 ENCOUNTER — Ambulatory Visit (INDEPENDENT_AMBULATORY_CARE_PROVIDER_SITE_OTHER): Payer: Medicare HMO | Admitting: Family Medicine

## 2023-11-22 VITALS — BP 120/77 | HR 66 | Temp 98.4°F | Ht 71.0 in | Wt 241.0 lb

## 2023-11-22 DIAGNOSIS — J0100 Acute maxillary sinusitis, unspecified: Secondary | ICD-10-CM | POA: Diagnosis not present

## 2023-11-22 MED ORDER — PROMETHAZINE-DM 6.25-15 MG/5ML PO SYRP: 5.0000 mL | ORAL_SOLUTION | Freq: Four times a day (QID) | ORAL | 0 refills | Status: DC | PRN

## 2023-11-22 MED ORDER — DOXYCYCLINE HYCLATE 100 MG PO TABS: 100.0000 mg | ORAL_TABLET | Freq: Two times a day (BID) | ORAL | 0 refills | Status: DC

## 2023-11-22 NOTE — Progress Notes (Signed)
Date:  11/22/2023   Name:  Gary Kramer   DOB:  December 07, 1945   MRN:  [id number]   Chief Complaint: Sinusitis (Pt in clinic today c/o possible sinus infection. Pt state he's had symptoms since 3wks)  Sinusitis This is a new problem. The current episode started 1 to 4 weeks ago. The problem has been gradually worsening since onset. There has been no fever. The pain is moderate. Associated symptoms include chills, congestion, coughing, a hoarse voice, sinus pressure and a sore throat. Pertinent negatives include no diaphoresis, ear pain, headaches, neck pain, shortness of breath, sneezing or swollen glands. Past treatments include lying down. The treatment provided moderate relief.    Lab Results  Component Value Date   NA 141 09/10/2023   K 4.7 09/10/2023   CO2 21 09/10/2023   GLUCOSE 123 (H) 09/10/2023   BUN 19 09/10/2023   CREATININE 1.49 (H) 09/10/2023   CALCIUM 9.4 09/10/2023   EGFR 49 (L) 09/10/2023   GFRNONAA 52 (L) 01/06/2021   Lab Results  Component Value Date   CHOL 141 01/03/2023   HDL 34 (L) 01/03/2023   LDLCALC 77 01/03/2023   TRIG 174 (H) 01/03/2023   CHOLHDL 4.1 07/16/2018   No results found for: "TSH" Lab Results  Component Value Date   HGBA1C 6.6 (H) 09/10/2023   Lab Results  Component Value Date   WBC 11.3 (H) 04/04/2018   HGB 9.7 (L) 04/04/2018   HCT 28.5 (L) 04/04/2018   MCV 85.9 04/04/2018   PLT 264 04/04/2018   Lab Results  Component Value Date   ALT 18 07/03/2022   AST 18 07/03/2022   ALKPHOS 108 07/03/2022   BILITOT 0.2 07/03/2022   No results found for: "25OHVITD2", "25OHVITD3", "VD25OH"   Review of Systems  Constitutional:  Positive for chills. Negative for diaphoresis and fever.  HENT:  Positive for congestion, hoarse voice, nosebleeds, postnasal drip, rhinorrhea, sinus pressure, sinus pain and sore throat. Negative for ear pain and sneezing.   Respiratory:  Positive for cough. Negative for shortness of breath.    Musculoskeletal:  Positive for myalgias. Negative for arthralgias and neck pain.  Neurological:  Negative for headaches.    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   Presence of left artificial knee joint 01/24/2018   Recurrent major depressive disorder, in partial remission (HCC) 01/16/2018   Primary osteoarthritis of left knee 12/26/2017   Chronic allergic rhinitis 01/05/2017   Essential hypertension 07/20/2015   Hyperlipidemia 07/20/2015   Erectile dysfunction 07/20/2015   Depression 07/20/2015   Allergic rhinitis due to pollen 07/20/2015   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     Past Surgical History:  Procedure Laterality Date   COLONOSCOPY  2016   cleared for 5 yrs- Dr Bluford Kaufmann  COLONOSCOPY WITH PROPOFOL N/A 03/10/2020   Procedure: COLONOSCOPY WITH PROPOFOL;  Surgeon: Wyline Mood, MD;  Location: [facility] ENDOSCOPY;  Service: Gastroenterology;  Laterality: N/A;   HERNIA REPAIR     JOINT REPLACEMENT Left 12/2017   TKR   JOINT REPLACEMENT Right 03/2018   Hip   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   Procedure: TOTAL HIP ARTHROPLASTY;  Surgeon: Christena Flake, MD;  Location: [facility] ORS;  Service: Orthopedics;  Laterality: Right;    Social History    Tobacco Use   Smoking status: Former    Current packs/day: 0.00    Average packs/day: 1.5 packs/day for 30.0 years (45.0 ttl pk-yrs)    Types: Cigarettes    Start date: 23    Quit date: 68    Years since quitting: 29.0   Smokeless tobacco: Never   Tobacco comments:    smoking cessatioinn materials not required  Vaping Use   Vaping status: Never Used  Substance Use Topics   Alcohol use: Yes    Alcohol/week: 0.0 standard drinks of alcohol    Comment: only occasionally   Drug use: No     Medication list has been reviewed and updated.  Current Meds  Medication Sig   amLODipine-benazepril (LOTREL) 10-20 MG capsule TAKE (1) CAPSULE BY MOUTH EVERY DAY   Ascorbic Acid (VITAMIN C) 100 MG tablet Take 1,000 mg by mouth daily.    aspirin EC 81 MG tablet Take 81 mg by mouth daily.    atorvastatin (LIPITOR) 20 MG tablet TAKE (1) TABLET BY MOUTH EVERY DAY   blood glucose meter kit and supplies KIT Dispense based on patient and insurance preference. Use up to four times daily as directed.   carvedilol (COREG) 6.25 MG tablet Take 1 tablet by mouth twice daily   cholecalciferol (VITAMIN D) 400 units TABS tablet Take 1,000 Units by mouth daily.    diclofenac Sodium (VOLTAREN) 1 % GEL Apply topically. PRN   fenofibrate (TRICOR) 145 MG tablet TAKE ONE TABLET BY MOUTH ONCE DAILY AT 6:00 IN THE MORNING.   fluticasone (FLONASE) 50 MCG/ACT nasal spray Place 1 spray into both nostrils daily as needed (for sinus/allergy issues.).   Garlic 705 MG CAPS Take 1,000 mg by mouth daily.    glipiZIDE (GLUCOTROL XL) 2.5 MG 24 hr tablet Take 1 tablet (2.5 mg total) by mouth daily with breakfast.   glucose blood (ONETOUCH ULTRA) test strip Use up to 4 times daily as directed   hydrochlorothiazide (MICROZIDE) 12.5 MG capsule TAKE (1) CAPSULE BY MOUTH EVERY DAY AT 8MV THE MORNING   loratadine (ALLERGY RELIEF) 10 MG tablet TAKE (1) TABLET BY MOUTH EVERY DAY   metFORMIN (GLUCOPHAGE) 500 MG tablet TAKE (1)  TABLET BY MOUTH TWICE DAILY WITH A MEAL.   Multiple Vitamin (MULTI-VITAMINS) TABS Take 1 tablet by mouth daily at 6 (six) AM.   Omega-3 Fatty Acids (FISH OIL MAXIMUM STRENGTH) 1200 MG CPDR    sertraline (ZOLOFT) 50 MG tablet Take 1 tablet (50 mg total) by mouth daily at 6 (six) AM.       11/22/2023    4:34 PM 09/10/2023   10:23 AM 05/01/2023   10:12 AM 01/03/2023   10:57 AM  GAD 7 : Generalized Anxiety Score  Nervous, Anxious, on Edge 0 0 0 0  Control/stop worrying 0 0 0 0  Worry too much - different things 0 0 0 0  Trouble relaxing 0 0 0 0  Restless 0 0 0 0  Easily annoyed or irritable  0 1 0 0  Afraid - awful might happen  0 0 0  Total GAD 7 Score  1 0 0  Anxiety Difficulty Not difficult at all Not difficult at all Not difficult at all Not difficult at all       11/22/2023    4:34 PM 09/10/2023   10:23 AM 05/01/2023   10:12 AM  Depression screen PHQ 2/9  Decreased Interest 0 0 0  Down, Depressed, Hopeless 0 0 0  PHQ - 2 Score 0 0 0  Altered sleeping 0 0 0  Tired, decreased energy 0 1 0  Change in appetite 0 0 0  Feeling bad or failure about yourself  0 0 0  Trouble concentrating 0 0 0  Moving slowly or fidgety/restless 0 0 0  Suicidal thoughts 0 0 0  PHQ-9 Score 0 1 0  Difficult doing work/chores Not difficult at all Not difficult at all Not difficult at all    BP Readings from Last 3 Encounters:  11/22/23 120/77  09/10/23 130/72  05/01/23 102/78    Physical Exam Vitals and nursing note reviewed.  HENT:     Head: Normocephalic.     Right Ear: Tympanic membrane and external ear normal. There is no impacted cerumen.     Left Ear: Tympanic membrane and external ear normal.     Nose: Nose normal. No congestion or rhinorrhea.     Mouth/Throat:     Mouth: Mucous membranes are moist.     Pharynx: No oropharyngeal exudate.  Eyes:     General: No scleral icterus.       Right eye: No discharge.        Left eye: No discharge.     Conjunctiva/sclera: Conjunctivae  normal.     Pupils: Pupils are equal, round, and reactive to light.  Neck:     Thyroid: No thyromegaly.     Vascular: No JVD.     Trachea: No tracheal deviation.  Cardiovascular:     Rate and Rhythm: Normal rate and regular rhythm.     Heart sounds: Normal heart sounds. No murmur heard.    No friction rub. No gallop.  Pulmonary:     Effort: No respiratory distress.     Breath sounds: Normal breath sounds. No wheezing, rhonchi or rales.  Abdominal:     General: Bowel sounds are normal.     Palpations: Abdomen is soft. There is no mass.     Tenderness: There is no abdominal tenderness. There is no guarding or rebound.  Musculoskeletal:        General: No tenderness. Normal range of motion.     Cervical back: Normal range of motion and neck supple.  Lymphadenopathy:     Cervical: No cervical adenopathy.  Skin:    General: Skin is warm.     Findings: No rash.  Neurological:     Mental Status: He is alert and oriented to person, place, and time.     Cranial Nerves: No cranial nerve deficit.     Deep Tendon Reflexes: Reflexes are normal and symmetric.     Wt Readings from Last 3 Encounters:  11/22/23 241 lb (109.3 kg)  09/10/23 253 lb 6.4 oz (114.9 kg)  05/01/23 246 lb (111.6 kg)    BP 120/77   Pulse 66   Temp 98.4 F (36.9 C)   Ht 5\' 11"  (1.803 m)   Wt 241 lb (109.3 kg)   SpO2 95%   BMI 33.61 kg/m  Assessment and Plan: 1. Acute maxillary sinusitis, recurrence not specified (Primary) New onset.  Persistent.  Stable.  Will initiate doxycycline 100 mg twice a day.  And control cough with Promethazine DM 1 teaspoon every 6 hours as needed as needed cough - doxycycline (VIBRA-TABS) 100 MG tablet; Take 1 tablet (100 mg total) by mouth 2 (two) times daily.  Dispense: 20 tablet; Refill: 0 - promethazine-dextromethorphan (PROMETHAZINE-DM) 6.25-15 MG/5ML syrup; Take 5 mLs by mouth 4 (four) times daily as needed.  Dispense: 118 mL; Refill: 0     Elizabeth Sauer, MD

## 2023-12-10 ENCOUNTER — Other Ambulatory Visit: Payer: Self-pay

## 2023-12-10 ENCOUNTER — Telehealth: Payer: Self-pay | Admitting: Family Medicine

## 2023-12-10 MED ORDER — AZITHROMYCIN 250 MG PO TABS: ORAL_TABLET | ORAL | 0 refills | Status: AC

## 2023-12-10 NOTE — Telephone Encounter (Signed)
 Patient was seen on 12/26. Worsening sinus drainage,still has a cough he said he's taken the medication that was prescribed to him he would like something else to be called in to the pharmacy.

## 2023-12-10 NOTE — Telephone Encounter (Signed)
 Pt called. Explained medication(Zpack) was sent to pharmacy

## 2024-01-15 ENCOUNTER — Other Ambulatory Visit: Payer: Self-pay | Admitting: Family Medicine

## 2024-01-15 DIAGNOSIS — I10 Essential (primary) hypertension: Secondary | ICD-10-CM

## 2024-01-21 DIAGNOSIS — Z87891 Personal history of nicotine dependence: Secondary | ICD-10-CM | POA: Diagnosis not present

## 2024-01-21 DIAGNOSIS — Z0000 Encounter for general adult medical examination without abnormal findings: Secondary | ICD-10-CM | POA: Diagnosis not present

## 2024-01-21 DIAGNOSIS — Z136 Encounter for screening for cardiovascular disorders: Secondary | ICD-10-CM | POA: Diagnosis not present

## 2024-01-21 DIAGNOSIS — F3341 Major depressive disorder, recurrent, in partial remission: Secondary | ICD-10-CM | POA: Diagnosis not present

## 2024-01-21 DIAGNOSIS — I10 Essential (primary) hypertension: Secondary | ICD-10-CM | POA: Diagnosis not present

## 2024-01-21 DIAGNOSIS — E785 Hyperlipidemia, unspecified: Secondary | ICD-10-CM | POA: Diagnosis not present

## 2024-01-21 DIAGNOSIS — E119 Type 2 diabetes mellitus without complications: Secondary | ICD-10-CM | POA: Diagnosis not present

## 2024-02-06 ENCOUNTER — Other Ambulatory Visit: Payer: Self-pay | Admitting: Internal Medicine

## 2024-02-06 DIAGNOSIS — Z136 Encounter for screening for cardiovascular disorders: Secondary | ICD-10-CM

## 2024-02-14 ENCOUNTER — Other Ambulatory Visit: Payer: Self-pay | Admitting: Family Medicine

## 2024-02-14 DIAGNOSIS — J301 Allergic rhinitis due to pollen: Secondary | ICD-10-CM

## 2024-02-18 DIAGNOSIS — I10 Essential (primary) hypertension: Secondary | ICD-10-CM | POA: Diagnosis not present

## 2024-02-18 DIAGNOSIS — E1122 Type 2 diabetes mellitus with diabetic chronic kidney disease: Secondary | ICD-10-CM | POA: Diagnosis not present

## 2024-02-18 DIAGNOSIS — N1832 Chronic kidney disease, stage 3b: Secondary | ICD-10-CM | POA: Diagnosis not present

## 2024-02-20 ENCOUNTER — Ambulatory Visit
Admission: RE | Admit: 2024-02-20 | Discharge: 2024-02-20 | Disposition: A | Discharge status: Home / Self Care | Source: Ambulatory Visit | Attending: Internal Medicine | Admitting: Internal Medicine

## 2024-02-20 DIAGNOSIS — Z136 Encounter for screening for cardiovascular disorders: Secondary | ICD-10-CM

## 2024-04-10 ENCOUNTER — Other Ambulatory Visit: Payer: Self-pay | Admitting: Family Medicine

## 2024-04-10 DIAGNOSIS — I10 Essential (primary) hypertension: Secondary | ICD-10-CM

## 2024-10-27 ENCOUNTER — Encounter: Payer: Self-pay | Admitting: Ophthalmology

## 2024-10-27 NOTE — Discharge Instructions (Signed)

## 2024-10-28 ENCOUNTER — Encounter: Payer: Self-pay | Admitting: Ophthalmology

## 2024-10-28 NOTE — Anesthesia Preprocedure Evaluation (Signed)
 Anesthesia Evaluation  Patient identified by MRN, date of birth, ID band Patient awake    Reviewed: Allergy & Precautions, H&P , NPO status , Patient's Chart, lab work & pertinent test results  Airway Mallampati: III  TM Distance: <3 FB Neck ROM: Full    Dental no notable dental hx. (+) Chipped, Caps Both upper central incisors are chipped. Gold caps/crowns on lower molars:   Pulmonary former smoker   Pulmonary exam normal breath sounds clear to auscultation       Cardiovascular hypertension, Normal cardiovascular exam Rhythm:Regular Rate:Normal  02-21-24 No evidence of abdominal aortic aneurysm    Neuro/Psych Seizures -,  PSYCHIATRIC DISORDERS  Depression    negative neurological ROS  negative psych ROS   GI/Hepatic negative GI ROS, Neg liver ROS,,,  Endo/Other  diabetes    Renal/GU Renal diseasenegative Renal ROS  negative genitourinary   Musculoskeletal negative musculoskeletal ROS (+) Arthritis ,    Abdominal   Peds negative pediatric ROS (+)  Hematology negative hematology ROS (+)   Anesthesia Other Findings Medical History  Hypertension  Hyperlipidemia Depression  Allergy Erectile dysfunction  Arthritis Diabetes mellitus without complication Seizure (HCC) Pelvic kidney  Chronic kidney disease, stage III (moderate) (HCC)     Reproductive/Obstetrics negative OB ROS                              Anesthesia Physical Anesthesia Plan  ASA: 3  Anesthesia Plan: MAC   Post-op Pain Management:    Induction: Intravenous  PONV Risk Score and Plan:   Airway Management Planned: Natural Airway and Nasal Cannula  Additional Equipment:   Intra-op Plan:   Post-operative Plan:   Informed Consent: I have reviewed the patients History and Physical, chart, labs and discussed the procedure including the risks, benefits and alternatives for the proposed anesthesia with the patient  or authorized representative who has indicated his/her understanding and acceptance.     Dental Advisory Given  Plan Discussed with: Anesthesiologist, CRNA and Surgeon  Anesthesia Plan Comments: (Patient consented for risks of anesthesia including but not limited to:  - adverse reactions to medications - damage to eyes, teeth, lips or other oral mucosa - nerve damage due to positioning  - sore throat or hoarseness - Damage to heart, brain, nerves, lungs, other parts of body or loss of life  Patient voiced understanding and assent.)         Anesthesia Quick Evaluation

## 2024-10-30 ENCOUNTER — Encounter: Admission: RE | Disposition: A | Payer: Self-pay | Source: Home / Self Care | Attending: Ophthalmology

## 2024-10-30 ENCOUNTER — Ambulatory Visit: Payer: Self-pay | Admitting: Anesthesiology

## 2024-10-30 ENCOUNTER — Other Ambulatory Visit: Payer: Self-pay

## 2024-10-30 ENCOUNTER — Ambulatory Visit
Admission: RE | Admit: 2024-10-30 | Discharge: 2024-10-30 | Disposition: A | Attending: Ophthalmology | Admitting: Ophthalmology

## 2024-10-30 HISTORY — DX: Ectopic kidney: Q63.2

## 2024-10-30 HISTORY — DX: Unspecified convulsions: R56.9

## 2024-10-30 HISTORY — DX: Chronic kidney disease, stage 3 unspecified: N18.30

## 2024-10-30 HISTORY — DX: Chronic kidney disease, unspecified: N18.9

## 2024-10-30 HISTORY — DX: Type 2 diabetes mellitus without complications: E11.9

## 2024-10-30 HISTORY — PX: CATARACT EXTRACTION W/PHACO: SHX586

## 2024-10-30 LAB — GLUCOSE, CAPILLARY: Glucose-Capillary: 145 mg/dL — ABNORMAL HIGH (ref 70–99)

## 2024-10-30 SURGERY — PHACOEMULSIFICATION, CATARACT, WITH IOL INSERTION
Anesthesia: Topical | Site: Eye | Laterality: Right

## 2024-10-30 MED ORDER — MOXIFLOXACIN HCL 0.5 % OP SOLN
OPHTHALMIC | Status: DC | PRN
  Administered 2024-10-30: .2 mL via OPHTHALMIC

## 2024-10-30 MED ORDER — LIDOCAINE HCL (PF) 2 % IJ SOLN
INTRAOCULAR | Status: DC | PRN
  Administered 2024-10-30: 4 mL via INTRAOCULAR

## 2024-10-30 MED ORDER — PHENYLEPHRINE HCL 10 % OP SOLN
1.0000 [drp] | OPHTHALMIC | Status: DC | PRN
  Administered 2024-10-30 (×3): 1 [drp] via OPHTHALMIC

## 2024-10-30 MED ORDER — TETRACAINE HCL 0.5 % OP SOLN
1.0000 [drp] | OPHTHALMIC | Status: DC | PRN
  Administered 2024-10-30 (×3): 1 [drp] via OPHTHALMIC

## 2024-10-30 MED ORDER — MIDAZOLAM HCL 2 MG/2ML IJ SOLN
INTRAMUSCULAR | Status: AC
  Filled 2024-10-30: qty 2

## 2024-10-30 MED ORDER — TETRACAINE HCL 0.5 % OP SOLN
OPHTHALMIC | Status: AC
  Filled 2024-10-30: qty 4

## 2024-10-30 MED ORDER — FENTANYL CITRATE (PF) 100 MCG/2ML IJ SOLN
INTRAMUSCULAR | Status: DC | PRN
  Administered 2024-10-30 (×2): 50 ug via INTRAVENOUS

## 2024-10-30 MED ORDER — PHENYLEPHRINE HCL 10 % OP SOLN
OPHTHALMIC | Status: AC
  Filled 2024-10-30: qty 5

## 2024-10-30 MED ORDER — BRIMONIDINE TARTRATE-TIMOLOL 0.2-0.5 % OP SOLN
OPHTHALMIC | Status: DC | PRN
  Administered 2024-10-30: 1 [drp] via OPHTHALMIC

## 2024-10-30 MED ORDER — MIDAZOLAM HCL (PF) 2 MG/2ML IJ SOLN
INTRAMUSCULAR | Status: DC | PRN
  Administered 2024-10-30: 2 mg via INTRAVENOUS

## 2024-10-30 MED ORDER — CYCLOPENTOLATE HCL 2 % OP SOLN
1.0000 [drp] | OPHTHALMIC | Status: DC | PRN
  Administered 2024-10-30 (×2): 1 [drp] via OPHTHALMIC

## 2024-10-30 MED ORDER — SIGHTPATH DOSE#1 BSS IO SOLN
INTRAOCULAR | Status: DC | PRN
  Administered 2024-10-30: 83 mL via OPHTHALMIC

## 2024-10-30 MED ORDER — SIGHTPATH DOSE#1 NA CHONDROIT SULF-NA HYALURON 20-15 MG/0.5ML IO SOSY
INTRAOCULAR | Status: DC | PRN
  Administered 2024-10-30: .5 mL via INTRAOCULAR

## 2024-10-30 MED ORDER — FENTANYL CITRATE (PF) 100 MCG/2ML IJ SOLN
INTRAMUSCULAR | Status: AC
  Filled 2024-10-30: qty 2

## 2024-10-30 MED ORDER — LACTATED RINGERS IV SOLN: INTRAVENOUS | Status: DC

## 2024-10-30 MED ORDER — CYCLOPENTOLATE HCL 2 % OP SOLN
OPHTHALMIC | Status: AC
  Filled 2024-10-30: qty 2

## 2024-10-30 MED ORDER — SIGHTPATH DOSE#1 NA HYALUR & NA CHOND-NA HYALUR IO KIT
PACK | INTRAOCULAR | Status: DC | PRN
  Administered 2024-10-30: 1 via OPHTHALMIC

## 2024-10-30 MED ORDER — SIGHTPATH DOSE#1 BSS IO SOLN
INTRAOCULAR | Status: DC | PRN
  Administered 2024-10-30: 15 mL via INTRAOCULAR

## 2024-10-30 SURGICAL SUPPLY — 9 items
DISSECTOR HYDRO NUCLEUS 50X22 (MISCELLANEOUS) ×1 IMPLANT
DRSG TEGADERM 2-3/8X2-3/4 SM (GAUZE/BANDAGES/DRESSINGS) ×1 IMPLANT
FEE CATARACT SUITE SIGHTPATH (MISCELLANEOUS) ×1 IMPLANT
GLOVE BIOGEL PI IND STRL 8 (GLOVE) ×1 IMPLANT
GLOVE SURG LX STRL 7.5 STRW (GLOVE) ×1 IMPLANT
GLOVE SURG SYN 6.5 PF PI BL (GLOVE) ×1 IMPLANT
LENS IOL TECNIS MONO 17.0 (Intraocular Lens) IMPLANT
NDL FILTER BLUNT 18X1 1/2 (NEEDLE) ×1 IMPLANT
SYR 3ML LL SCALE MARK (SYRINGE) ×1 IMPLANT

## 2024-10-30 NOTE — Anesthesia Postprocedure Evaluation (Signed)
 Anesthesia Post Note  Patient: Shamarr Faucett  Procedure(s) Performed: PHACOEMULSIFICATION, CATARACT, WITH IOL INSERTION 12.13 00:57.5 (Right: Eye)  Patient location during evaluation: PACU Anesthesia Type: MAC Level of consciousness: awake and alert Pain management: pain level controlled Vital Signs Assessment: post-procedure vital signs reviewed and stable Respiratory status: spontaneous breathing, nonlabored ventilation, respiratory function stable and patient connected to nasal cannula oxygen  Cardiovascular status: stable and blood pressure returned to baseline Postop Assessment: no apparent nausea or vomiting Anesthetic complications: no   No notable events documented.   Last Vitals:  Vitals:   10/30/24 0933 10/30/24 0937  BP: 137/63 128/72  Pulse: 64 63  Resp: 14 14  Temp: (!) 36.3 C (!) 36.3 C  SpO2: 95% 94%    Last Pain:  Vitals:   10/30/24 0937  TempSrc:   PainSc: 0-No pain                 Donny JAYSON Mu

## 2024-10-30 NOTE — Transfer of Care (Signed)
 Immediate Anesthesia Transfer of Care Note  Patient: Gary Kramer  Procedure(s) Performed: PHACOEMULSIFICATION, CATARACT, WITH IOL INSERTION 12.13 00:57.5 (Right: Eye)  Patient Location: PACU  Anesthesia Type: MAC  Level of Consciousness: awake, alert  and patient cooperative  Airway and Oxygen  Therapy: Patient Spontanous Breathing   Post-op Assessment: Post-op Vital signs reviewed, Patient's Cardiovascular Status Stable, Respiratory Function Stable, Patent Airway and No signs of Nausea or vomiting  Post-op Vital Signs: Reviewed and stable  Complications: No notable events documented.

## 2024-10-30 NOTE — H&P (Signed)
 [facility]   Primary Care Physician:  Salli Amato, MD Ophthalmologist: Dr. Feliciano Ober  Pre-Procedure History & Physical: HPI:  Gary Kramer is a 78 y.o. male here for cataract surgery.   Past Medical History:  Diagnosis Date   Allergy    Arthritis    Chronic kidney disease (CKD)    Chronic kidney disease, stage III (moderate) (HCC)    secondary to NSAID use, pelvic kidney and HTN per nephrologist   Depression    Diabetes mellitus without complication (HCC)    type 2   Erectile dysfunction    Hyperlipidemia    Hypertension    Pelvic kidney    right pelvic kidney   Seizure (HCC)    from pain of having finger crushed/fractured    Past Surgical History:  Procedure Laterality Date   COLONOSCOPY  2016   cleared for 5 yrs- Dr Ora   COLONOSCOPY WITH PROPOFOL  N/A 03/10/2020   Procedure: COLONOSCOPY WITH PROPOFOL ;  Surgeon: Therisa Bi, MD;  Location: [facility] ENDOSCOPY;  Service: Gastroenterology;  Laterality: N/A;   HERNIA REPAIR     JOINT REPLACEMENT Left 12/2017   TKR   JOINT REPLACEMENT Right 03/2018   Hip   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 Authorizing Provider  amLODipine -benazepril  (LOTREL) 10-20 MG capsule TAKE (1) CAPSULE BY MOUTH EVERY DAY 01/15/24   Joshua Cathryne BROCKS, MD  Ascorbic Acid  (VITAMIN C ) 100 MG tablet Take 1,000 mg by mouth daily.     [provider]  aspirin  EC 81 MG tablet Take 81 mg by mouth daily.     [provider]  atorvastatin  (LIPITOR) 20 MG tablet TAKE (1) TABLET BY MOUTH EVERY DAY 09/10/23   Jones, Deanna C, MD  blood glucose meter kit and supplies KIT Dispense based on patient and insurance preference. Use up to four times daily as directed. 09/12/22   Joshua Cathryne BROCKS, MD  carvedilol  (COREG ) 6.25 MG  tablet Take 1 tablet by mouth twice daily 09/10/23   Jones, Deanna C, MD  cholecalciferol  (VITAMIN D ) 400 units TABS tablet Take 1,000 Units by mouth daily.     [provider]  diclofenac  Sodium (VOLTAREN ) 1 % GEL Apply topically. PRN 10/08/15   [provider]  fenofibrate  (TRICOR ) 145 MG tablet TAKE ONE TABLET BY MOUTH ONCE DAILY AT 6:00 IN THE MORNING. 09/10/23   Joshua Cathryne BROCKS, MD  fluticasone  (FLONASE ) 50 MCG/ACT nasal spray Place 1 spray into both nostrils daily as needed (for sinus/allergy issues.). 09/10/23   Joshua Cathryne BROCKS, MD  Garlic  705 MG CAPS Take 1,000 mg by mouth daily.     [provider]  glucose blood (ONETOUCH ULTRA) test strip Use up to 4 times daily as directed 09/12/22   Joshua Cathryne BROCKS, MD  hydrochlorothiazide  (MICROZIDE ) 12.5 MG capsule TAKE (1) CAPSULE BY MOUTH EVERY DAY AT [facility] THE MORNING 09/10/23   Joshua Cathryne BROCKS, MD  loratadine  (CLARITIN ) 10 MG tablet TAKE (1) TABLET BY MOUTH EVERY DAY 02/14/24   Jones, Deanna C, MD  metFORMIN  (GLUCOPHAGE ) 500 MG tablet TAKE (1) TABLET BY MOUTH TWICE DAILY WITH A MEAL. 09/10/23   Joshua Cathryne BROCKS, MD  Multiple Vitamin (MULTI-VITAMINS) TABS Take 1 tablet by mouth daily at 6 (six) AM.    [provider]  Omega-3 Fatty Acids (FISH OIL MAXIMUM STRENGTH) 1200 MG CPDR  08/07/19   [provider]  sertraline  (ZOLOFT ) 50 MG tablet Take 1 tablet (50 mg total) by mouth daily at 6 (six) AM. 09/10/23   Joshua Cathryne BROCKS, MD    Allergies as of 10/01/2024 - Review Complete 11/22/2023  Allergen Reaction Noted   Cefaclor Itching 07/20/2015   Penicillin v potassium Rash 07/20/2015    Family History  Problem Relation Age of Onset   Cancer Mother        brain   Stroke Father     Social History   Socioeconomic History   Marital status: Widowed    Spouse name: Not on file   Number of children: 2   Years of education: Not on file   Highest education level: Bachelor's degree (e.g., BA, AB, BS)   Occupational History   Occupation: Retired  Tobacco Use   Smoking status: Former    Current packs/day: 0.00    Average packs/day: 1.5 packs/day for 30.0 years (45.0 ttl pk-yrs)    Types: Cigarettes    Start date: 1966    Quit date: 1996    Years since quitting: 29.9   Smokeless tobacco: Never   Tobacco comments:    smoking cessatioinn materials not required  Vaping Use   Vaping status: Never Used  Substance and Sexual Activity   Alcohol use: Yes    Alcohol/week: 0.0 standard drinks of alcohol    Comment: only occasionally   Drug use: Yes    Types: Marijuana    Comment: 40 years ago   Sexual activity: Not Currently  Other Topics Concern   Not on file  Social History Narrative   Pt lives alone   Social Drivers of Health   Financial Resource Strain: Low Risk  (01/21/2024)   Received from [facility]   Overall Financial Resource Strain (CARDIA)    Difficulty of Paying Living Expenses: Not hard at all  Food Insecurity: No Food Insecurity (01/21/2024)   Received from [facility]   Hunger Vital Sign    Within the past 12 months, you worried that your food would run out before you got the money to buy more.: Never true    Within the past 12 months, the food you bought just didn't last and you didn't have money to get more.: Never true  Transportation Needs: No Transportation Needs (01/21/2024)   Received from [facility]    In the past 12 months, has lack of transportation kept you from medical appointments or from getting medications?: No    Lack of Transportation (Non-Medical): No  Physical Activity: Inactive (01/17/2023)   Exercise Vital Sign    Days of Exercise per Week: 0 days    Minutes of Exercise per Session: 0 min  Stress: No Stress Concern Present (01/17/2023)   [employer] of Occupational [employer] - Occupational Stress Questionnaire    Feeling of Stress : Not at all  Social  Connections: Socially Isolated (01/17/2023)   Social Connection and Isolation Panel    Frequency of Communication with Friends and Family: More than three times a week    Frequency of Social Gatherings with Friends and Family: Twice a week    Attends Religious Services: Never    Database Administrator or Organizations: No    Attends Banker Meetings: Never    Marital Status: Widowed  Intimate Partner Violence: Not  At Risk (01/17/2023)   Humiliation, Afraid, Rape, and Kick questionnaire    Fear of Current or Ex-Partner: No    Emotionally Abused: No    Physically Abused: No    Sexually Abused: No    Review of Systems: See HPI, otherwise negative ROS  Physical Exam: Ht 6' (1.829 m)   Wt 111.1 kg   BMI 33.23 kg/m  General:   Alert, cooperative in NAD Head:  Normocephalic and atraumatic. Respiratory:  Normal work of breathing. Cardiovascular:  RRR  Impression/Plan: Dwyne Hasegawa is here for cataract surgery.  Risks, benefits, limitations, and alternatives regarding cataract surgery have been reviewed with the patient.  Questions have been answered.  All parties agreeable.   Feliciano Bryan Ober, MD  10/30/2024, 7:13 AM

## 2024-10-30 NOTE — Op Note (Signed)
 OPERATIVE NOTE  Vivian Okelley [id number] 10/30/2024   PREOPERATIVE DIAGNOSIS: Nuclear sclerotic cataract right eye. H25.11   POSTOPERATIVE DIAGNOSIS: Nuclear sclerotic cataract right eye. H25.11   PROCEDURE:  Phacoemulsification with posterior chamber intraocular lens placement of the right eye  Ultrasound time: Procedure(s): PHACOEMULSIFICATION, CATARACT, WITH IOL INSERTION 12.13 00:57.5 (Right)  LENS:   Implant Name Type Inv. Item Serial No. Manufacturer Lot No. LRB No. Used Action  LENS IOL TECNIS MONO 17.0 - [id number] Intraocular Lens LENS IOL TECNIS MONO 17.0 [id number] [employer]  Right 1 Implanted      SURGEON:  Feliciano HERO. Enola, MD   ANESTHESIA:  Topical with tetracaine drops, augmented with 1% preservative-free intracameral lidocaine .   COMPLICATIONS:  None.   DESCRIPTION OF PROCEDURE:  The patient was identified in the holding room and transported to the operating room and placed in the supine position under the operating microscope.  The right eye was identified as the operative eye, which was prepped and draped in the usual sterile ophthalmic fashion.   A 1 millimeter clear-corneal paracentesis was made superotemporally. Preservative-free 1% lidocaine  mixed with 1:1,000 bisulfite-free aqueous solution of epinephrine  was injected into the anterior chamber. The anterior chamber was then filled with Viscoat viscoelastic. A 2.4 millimeter keratome was used to make a clear-corneal incision inferotemporally. A curvilinear capsulorrhexis was made with a cystotome and capsulorrhexis forceps. Balanced salt solution was used to hydrodissect and hydrodelineate the nucleus. Phacoemulsification was then used to remove the lens nucleus and epinucleus. The remaining cortex was then removed using the irrigation and aspiration handpiece. Provisc was then placed into the capsular bag to distend it for lens placement. A +17.00 D DCB00 intraocular lens was then injected into the capsular  bag. The remaining viscoelastic was aspirated.   Wounds were hydrated with balanced salt solution.  The anterior chamber was inflated to a physiologic pressure with balanced salt solution.  No wound leaks were noted. Moxifloxacin was injected intracamerally.  Timolol and Brimonidine drops were applied to the eye.  The patient was taken to the recovery room in stable condition without complications of anesthesia or surgery.  Feliciano Hugger [place] 10/30/2024, 9:33 AM

## 2024-10-31 ENCOUNTER — Other Ambulatory Visit: Payer: Self-pay

## 2024-10-31 ENCOUNTER — Encounter: Payer: Self-pay | Admitting: Ophthalmology

## 2024-11-04 NOTE — Discharge Instructions (Signed)

## 2024-11-06 ENCOUNTER — Ambulatory Visit
Admission: RE | Admit: 2024-11-06 | Discharge: 2024-11-06 | Disposition: A | Attending: Ophthalmology | Admitting: Ophthalmology

## 2024-11-06 ENCOUNTER — Ambulatory Visit

## 2024-11-06 ENCOUNTER — Other Ambulatory Visit: Payer: Self-pay

## 2024-11-06 ENCOUNTER — Encounter: Payer: Self-pay | Admitting: Ophthalmology

## 2024-11-06 ENCOUNTER — Encounter: Admission: RE | Disposition: A | Payer: Self-pay | Source: Home / Self Care | Attending: Ophthalmology

## 2024-11-06 HISTORY — PX: CATARACT EXTRACTION W/PHACO: SHX586

## 2024-11-06 LAB — GLUCOSE, CAPILLARY: Glucose-Capillary: 141 mg/dL — ABNORMAL HIGH (ref 70–99)

## 2024-11-06 SURGERY — PHACOEMULSIFICATION, CATARACT, WITH IOL INSERTION
Anesthesia: Topical | Laterality: Left

## 2024-11-06 MED ORDER — PHENYLEPHRINE HCL 10 % OP SOLN
OPHTHALMIC | Status: AC
  Filled 2024-11-06: qty 5

## 2024-11-06 MED ORDER — LIDOCAINE HCL (PF) 2 % IJ SOLN
INTRAOCULAR | Status: DC | PRN
  Administered 2024-11-06: 4 mL via INTRAOCULAR

## 2024-11-06 MED ORDER — CYCLOPENTOLATE HCL 2 % OP SOLN
OPHTHALMIC | Status: AC
  Filled 2024-11-06: qty 2

## 2024-11-06 MED ORDER — CYCLOPENTOLATE HCL 2 % OP SOLN
1.0000 [drp] | OPHTHALMIC | Status: AC
  Administered 2024-11-06 (×3): 1 [drp] via OPHTHALMIC

## 2024-11-06 MED ORDER — SIGHTPATH DOSE#1 BSS IO SOLN
INTRAOCULAR | Status: DC | PRN
  Administered 2024-11-06: 15 mL via INTRAOCULAR

## 2024-11-06 MED ORDER — SIGHTPATH DOSE#1 BSS IO SOLN
INTRAOCULAR | Status: DC | PRN
  Administered 2024-11-06: 80 mL via OPHTHALMIC

## 2024-11-06 MED ORDER — MIDAZOLAM HCL 2 MG/2ML IJ SOLN
INTRAMUSCULAR | Status: AC
  Filled 2024-11-06: qty 2

## 2024-11-06 MED ORDER — TETRACAINE HCL 0.5 % OP SOLN
OPHTHALMIC | Status: AC
  Filled 2024-11-06: qty 4

## 2024-11-06 MED ORDER — PHENYLEPHRINE HCL 10 % OP SOLN
1.0000 [drp] | OPHTHALMIC | Status: AC
  Administered 2024-11-06 (×3): 1 [drp] via OPHTHALMIC

## 2024-11-06 MED ORDER — SIGHTPATH DOSE#1 NA HYALUR & NA CHOND-NA HYALUR IO KIT
PACK | INTRAOCULAR | Status: DC | PRN
  Administered 2024-11-06: 1 via OPHTHALMIC

## 2024-11-06 MED ORDER — BRIMONIDINE TARTRATE-TIMOLOL 0.2-0.5 % OP SOLN
OPHTHALMIC | Status: DC | PRN
  Administered 2024-11-06: 1 [drp] via OPHTHALMIC

## 2024-11-06 MED ORDER — MIDAZOLAM HCL (PF) 2 MG/2ML IJ SOLN
INTRAMUSCULAR | Status: DC | PRN
  Administered 2024-11-06 (×2): 1 mg via INTRAVENOUS

## 2024-11-06 MED ORDER — TETRACAINE HCL 0.5 % OP SOLN
1.0000 [drp] | OPHTHALMIC | Status: DC | PRN
  Administered 2024-11-06 (×3): 1 [drp] via OPHTHALMIC

## 2024-11-06 MED ORDER — MOXIFLOXACIN HCL 0.5 % OP SOLN
OPHTHALMIC | Status: DC | PRN
  Administered 2024-11-06: .2 mL via OPHTHALMIC

## 2024-11-06 MED ORDER — FENTANYL CITRATE (PF) 100 MCG/2ML IJ SOLN
INTRAMUSCULAR | Status: DC | PRN
  Administered 2024-11-06 (×2): 50 ug via INTRAVENOUS

## 2024-11-06 MED ORDER — FENTANYL CITRATE (PF) 100 MCG/2ML IJ SOLN
INTRAMUSCULAR | Status: AC
  Filled 2024-11-06: qty 2

## 2024-11-06 SURGICAL SUPPLY — 9 items
DISSECTOR HYDRO NUCLEUS 50X22 (MISCELLANEOUS) ×1 IMPLANT
DRSG TEGADERM 2-3/8X2-3/4 SM (GAUZE/BANDAGES/DRESSINGS) ×1 IMPLANT
FEE CATARACT SUITE SIGHTPATH (MISCELLANEOUS) ×1 IMPLANT
GLOVE BIOGEL PI IND STRL 8 (GLOVE) ×1 IMPLANT
GLOVE SURG LX STRL 7.5 STRW (GLOVE) ×1 IMPLANT
GLOVE SURG SYN 6.5 PF PI BL (GLOVE) ×1 IMPLANT
LENS IOL TECNIS MONO 17.5 (Intraocular Lens) IMPLANT
NDL FILTER BLUNT 18X1 1/2 (NEEDLE) ×1 IMPLANT
SYR 3ML LL SCALE MARK (SYRINGE) ×1 IMPLANT

## 2024-11-06 NOTE — Anesthesia Postprocedure Evaluation (Signed)
 Anesthesia Post Note  Patient: Gary Kramer  Procedure(s) Performed: PHACOEMULSIFICATION, CATARACT, WITH IOL INSERTION 17.07, 01:27.1 (Left)  Patient location during evaluation: PACU Anesthesia Type: MAC Level of consciousness: awake and alert Pain management: pain level controlled Vital Signs Assessment: post-procedure vital signs reviewed and stable Respiratory status: spontaneous breathing, nonlabored ventilation, respiratory function stable and patient connected to nasal cannula oxygen  Cardiovascular status: stable and blood pressure returned to baseline Postop Assessment: no apparent nausea or vomiting Anesthetic complications: no   No notable events documented.   Last Vitals:  Vitals:   11/06/24 0940 11/06/24 0944  BP: (!) 148/74 (!) 140/95  Pulse: 64 60  Resp: 16 15  Temp:  (!) 36.2 C  SpO2: 93% 93%    Last Pain:  Vitals:   11/06/24 0944  TempSrc:   PainSc: 0-No pain                 [name]

## 2024-11-06 NOTE — Transfer of Care (Signed)
 Immediate Anesthesia Transfer of Care Note  Patient: Gary Kramer  Procedure(s) Performed: PHACOEMULSIFICATION, CATARACT, WITH IOL INSERTION 17.07, 01:27.1 (Left)  Patient Location: PACU  Anesthesia Type: MAC  Level of Consciousness: awake, alert  and patient cooperative  Airway and Oxygen  Therapy: Patient Spontanous Breathing and Patient connected to supplemental oxygen   Post-op Assessment: Post-op Vital signs reviewed, Patient's Cardiovascular Status Stable, Respiratory Function Stable, Patent Airway and No signs of Nausea or vomiting  Post-op Vital Signs: Reviewed and stable  Complications: No notable events documented.

## 2024-11-06 NOTE — Op Note (Signed)
 OPERATIVE NOTE  Mounir Skipper [id number] 11/06/2024   PREOPERATIVE DIAGNOSIS: Nuclear sclerotic cataract left eye. H25.12   POSTOPERATIVE DIAGNOSIS: Nuclear sclerotic cataract left eye. H25.12   PROCEDURE:  Phacoemulsification with posterior chamber intraocular lens placement of the left eye  Ultrasound time: Procedures: PHACOEMULSIFICATION, CATARACT, WITH IOL INSERTION 17.07, 01:27.1 (Left)  LENS:   Implant Name Type Inv. Item Serial No. Manufacturer Lot No. LRB No. Used Action  LENS IOL TECNIS MONO 17.5 - [id number] Intraocular Lens LENS IOL TECNIS MONO 17.5 [id number] [employer]  Left 1 Implanted      SURGEON:  Feliciano HERO. Enola, MD   ANESTHESIA:  Topical with tetracaine  drops, augmented with 1% preservative-free intracameral lidocaine .   COMPLICATIONS:  None.   DESCRIPTION OF PROCEDURE:  The patient was identified in the holding room and transported to the operating room and placed in the supine position under the operating microscope.  The left eye was identified as the operative eye, which was prepped and draped in the usual sterile ophthalmic fashion.   A 1 millimeter clear-corneal paracentesis was made inferotemporally. Preservative-free 1% lidocaine  mixed with 1:1,000 bisulfite-free aqueous solution of epinephrine  was injected into the anterior chamber. The anterior chamber was then filled with Viscoat viscoelastic. A 2.4 millimeter keratome was used to make a clear-corneal incision superotemporally. A curvilinear capsulorrhexis was made with a cystotome and capsulorrhexis forceps. Balanced salt solution was used to hydrodissect and hydrodelineate the nucleus. Phacoemulsification was then used to remove the lens nucleus and epinucleus. The remaining cortex was then removed using the irrigation and aspiration handpiece. Provisc was then placed into the capsular bag to distend it for lens placement. A +17.50 D DCB00 intraocular lens was then injected into the capsular bag. The  remaining viscoelastic was aspirated.   Wounds were hydrated with balanced salt solution.  The anterior chamber was inflated to a physiologic pressure with balanced salt solution.  No wound leaks were noted. Moxifloxacin  was injected intracamerally.  Timolol  and Brimonidine  drops were applied to the eye.  The patient was taken to the recovery room in stable condition without complications of anesthesia or surgery.  Feliciano Hugger [place] 11/06/2024, 9:37 AM

## 2024-11-06 NOTE — Anesthesia Preprocedure Evaluation (Signed)
 Anesthesia Evaluation  Patient identified by MRN, date of birth, ID band Patient awake    Reviewed: Allergy & Precautions, H&P , NPO status , Patient's Chart, lab work & pertinent test results  History of Anesthesia Complications Negative for: history of anesthetic complications  Airway Mallampati: III  TM Distance: <3 FB Neck ROM: Full    Dental no notable dental hx. (+) Chipped, Caps Both upper central incisors are chipped. Gold caps/crowns on lower molars:   Pulmonary former smoker   Pulmonary exam normal breath sounds clear to auscultation       Cardiovascular hypertension, Normal cardiovascular exam Rhythm:Regular Rate:Normal  02-21-24 No evidence of abdominal aortic aneurysm    Neuro/Psych Seizures -,  PSYCHIATRIC DISORDERS  Depression    negative neurological ROS  negative psych ROS   GI/Hepatic negative GI ROS, Neg liver ROS,,,  Endo/Other  diabetes    Renal/GU Renal diseasenegative Renal ROS  negative genitourinary   Musculoskeletal negative musculoskeletal ROS (+) Arthritis ,    Abdominal   Peds negative pediatric ROS (+)  Hematology negative hematology ROS (+)   Anesthesia Other Findings Medical History  Hypertension  Hyperlipidemia Depression  Allergy Erectile dysfunction  Arthritis Diabetes mellitus without complication Seizure (HCC) Pelvic kidney  Chronic kidney disease, stage III (moderate) (HCC)     Reproductive/Obstetrics negative OB ROS                              Anesthesia Physical Anesthesia Plan  ASA: 3  Anesthesia Plan: MAC   Post-op Pain Management:    Induction: Intravenous  PONV Risk Score and Plan:   Airway Management Planned: Natural Airway and Nasal Cannula  Additional Equipment:   Intra-op Plan:   Post-operative Plan:   Informed Consent: I have reviewed the patients History and Physical, chart, labs and discussed the procedure  including the risks, benefits and alternatives for the proposed anesthesia with the patient or authorized representative who has indicated his/her understanding and acceptance.     Dental Advisory Given  Plan Discussed with: Anesthesiologist, CRNA and Surgeon  Anesthesia Plan Comments: (Patient consented for risks of anesthesia including but not limited to:  - adverse reactions to medications - damage to eyes, teeth, lips or other oral mucosa - nerve damage due to positioning  - sore throat or hoarseness - Damage to heart, brain, nerves, lungs, other parts of body or loss of life  Patient voiced understanding and assent.)         Anesthesia Quick Evaluation

## 2024-11-06 NOTE — Addendum Note (Signed)
 Addendum  created 11/06/24 9047 by Myra Lawless, CRNA   Flowsheet accepted

## 2024-11-06 NOTE — H&P (Signed)
 [facility]   Primary Care Physician:  Salli Amato, MD Ophthalmologist: Dr. Feliciano Ober  Pre-Procedure History & Physical: HPI:  Gary Kramer is a 78 y.o. male here for cataract surgery.   Past Medical History:  Diagnosis Date   Allergy    Arthritis    Chronic kidney disease (CKD)    Chronic kidney disease, stage III (moderate) (HCC)    secondary to NSAID use, pelvic kidney and HTN per nephrologist   Depression    Diabetes mellitus without complication (HCC)    type 2   Erectile dysfunction    Hyperlipidemia    Hypertension    Pelvic kidney    right pelvic kidney   Seizure (HCC)    from pain of having finger crushed/fractured    Past Surgical History:  Procedure Laterality Date   CATARACT EXTRACTION W/PHACO Right 10/30/2024   Procedure: PHACOEMULSIFICATION, CATARACT, WITH IOL INSERTION 12.13 00:57.5;  Surgeon: Ober Feliciano Hugger, MD;  Location: [facility];  Service: Ophthalmology;  Laterality: Right;   COLONOSCOPY  2016   cleared for 5 yrs- Dr Ora   COLONOSCOPY WITH PROPOFOL  N/A 03/10/2020   Procedure: COLONOSCOPY WITH PROPOFOL ;  Surgeon: Therisa Bi, MD;  Location: [facility];  Service: Gastroenterology;  Laterality: N/A;   HERNIA REPAIR     JOINT REPLACEMENT Left 12/2017   TKR   JOINT REPLACEMENT Right 03/2018   Hip   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 Authorizing Provider  amLODipine -benazepril  (LOTREL) 10-20 MG capsule TAKE (1) CAPSULE BY MOUTH EVERY DAY 01/15/24   Joshua Cathryne BROCKS, MD  Ascorbic Acid  (VITAMIN C ) 100 MG tablet Take 1,000 mg by mouth daily.     [provider]  aspirin  EC 81 MG tablet Take 81 mg by mouth daily.     [provider]  atorvastatin  (LIPITOR) 20 MG tablet TAKE (1) TABLET BY MOUTH  EVERY DAY 09/10/23   Jones, Deanna C, MD  blood glucose meter kit and supplies KIT Dispense based on patient and insurance preference. Use up to four times daily as directed. 09/12/22   Joshua Cathryne BROCKS, MD  carvedilol  (COREG ) 6.25 MG tablet Take 1 tablet by mouth twice daily 09/10/23   Jones, Deanna C, MD  cholecalciferol  (VITAMIN D ) 400 units TABS tablet Take 1,000 Units by mouth daily.     [provider]  diclofenac  Sodium (VOLTAREN ) 1 % GEL Apply topically. PRN 10/08/15   [provider]  fenofibrate  (TRICOR ) 145 MG tablet TAKE ONE TABLET BY MOUTH ONCE DAILY AT 6:00 IN THE MORNING. 09/10/23   Joshua Cathryne BROCKS, MD  fluticasone  (FLONASE ) 50 MCG/ACT nasal spray Place 1 spray into both nostrils daily as needed (for sinus/allergy issues.). 09/10/23   Joshua Cathryne BROCKS, MD  Garlic  705 MG CAPS Take 1,000 mg by mouth daily.     [provider]  glucose blood (ONETOUCH ULTRA) test strip Use up to 4 times daily as directed 09/12/22   Joshua Cathryne BROCKS, MD  hydrochlorothiazide  (MICROZIDE ) 12.5 MG capsule TAKE (1) CAPSULE BY MOUTH EVERY DAY AT [facility] THE MORNING 09/10/23   Joshua Cathryne BROCKS, MD  loratadine  (CLARITIN ) 10 MG tablet TAKE (1) TABLET BY MOUTH EVERY DAY 02/14/24   Joshua Cathryne BROCKS, MD  metFORMIN  (GLUCOPHAGE ) 500 MG tablet TAKE (1) TABLET BY  MOUTH TWICE DAILY WITH A MEAL. 09/10/23   Joshua Cathryne BROCKS, MD  Multiple Vitamin (MULTI-VITAMINS) TABS Take 1 tablet by mouth daily at 6 (six) AM.    [provider]  Omega-3 Fatty Acids (FISH OIL MAXIMUM STRENGTH) 1200 MG CPDR  08/07/19   [provider]  sertraline  (ZOLOFT ) 50 MG tablet Take 1 tablet (50 mg total) by mouth daily at 6 (six) AM. 09/10/23   Joshua Cathryne BROCKS, MD    Allergies as of 10/01/2024 - Review Complete 11/22/2023  Allergen Reaction Noted   Cefaclor Itching 07/20/2015   Penicillin v potassium Rash 07/20/2015    Family History  Problem Relation Age of Onset   Cancer Mother        brain   Stroke Father      Social History   Socioeconomic History   Marital status: Widowed    Spouse name: Not on file   Number of children: 2   Years of education: Not on file   Highest education level: Bachelor's degree (e.g., BA, AB, BS)  Occupational History   Occupation: Retired  Tobacco Use   Smoking status: Former    Current packs/day: 0.00    Average packs/day: 1.5 packs/day for 30.0 years (45.0 ttl pk-yrs)    Types: Cigarettes    Start date: 1966    Quit date: 1996    Years since quitting: 29.9   Smokeless tobacco: Never   Tobacco comments:    smoking cessatioinn materials not required  Vaping Use   Vaping status: Never Used  Substance and Sexual Activity   Alcohol use: Yes    Alcohol/week: 0.0 standard drinks of alcohol    Comment: only occasionally   Drug use: Yes    Types: Marijuana    Comment: 40 years ago   Sexual activity: Not Currently  Other Topics Concern   Not on file  Social History Narrative   Pt lives alone   Social Drivers of Health   Tobacco Use: Medium Risk (10/31/2024)   Patient History    Smoking Tobacco Use: Former    Smokeless Tobacco Use: Never    Passive Exposure: Not on file  Financial Resource Strain: Low Risk  (01/21/2024)   Received from [facility]   Overall Financial Resource Strain (CARDIA)    Difficulty of Paying Living Expenses: Not hard at all  Food Insecurity: No Food Insecurity (01/21/2024)   Received from [facility]   Epic    Within the past 12 months, you worried that your food would run out before you got the money to buy more.: Never true    Within the past 12 months, the food you bought just didn't last and you didn't have money to get more.: Never true  Transportation Needs: No Transportation Needs (01/21/2024)   Received from [facility] - Transportation    In the past 12 months, has lack of transportation kept you from medical appointments or from getting medications?:  No    Lack of Transportation (Non-Medical): No  Physical Activity: Inactive (01/17/2023)   Exercise Vital Sign    Days of Exercise per Week: 0 days    Minutes of Exercise per Session: 0 min  Stress: No Stress Concern Present (01/17/2023)   [employer] - Occupational Stress Questionnaire    Feeling of Stress : Not at all  Social Connections: Socially Isolated (01/17/2023)   Social Connection and Isolation Panel    Frequency  of Communication with Friends and Family: More than three times a week    Frequency of Social Gatherings with Friends and Family: Twice a week    Attends Religious Services: Never    Database Administrator or Organizations: No    Attends Banker Meetings: Never    Marital Status: Widowed  Intimate Partner Violence: Not At Risk (01/17/2023)   Humiliation, Afraid, Rape, and Kick questionnaire    Fear of Current or Ex-Partner: No    Emotionally Abused: No    Physically Abused: No    Sexually Abused: No  Depression (PHQ2-9): Low Risk (11/22/2023)   Depression (PHQ2-9)    PHQ-2 Score: 0  Alcohol Screen: Low Risk (01/17/2023)   Alcohol Screen    Last Alcohol Screening Score (AUDIT): 2  Housing: Low Risk  (01/21/2024)   Received from [facility]   Epic    In the last 12 months, was there a time when you were not able to pay the mortgage or rent on time?: No    In the past 12 months, how many times have you moved where you were living?: 0    At any time in the past 12 months, were you homeless or living in a shelter (including now)?: No  Utilities: Not At Risk (01/21/2024)   Received from [facility] Utilities    Threatened with loss of utilities: No  Health Literacy: Not on file    Review of Systems: See HPI, otherwise negative ROS  Physical Exam: There were no vitals taken for this visit. General:   Alert, cooperative in NAD Head:  Normocephalic and atraumatic. Respiratory:   Normal work of breathing. Cardiovascular:  RRR  Impression/Plan: Fahim Kats is here for cataract surgery.  Risks, benefits, limitations, and alternatives regarding cataract surgery have been reviewed with the patient.  Questions have been answered.  All parties agreeable.   Feliciano Bryan Ober, MD  11/06/2024, 7:14 AM
